# Patient Record
Sex: Male | Born: 1970 | Race: White | Hispanic: No | Marital: Single | Smoking: Former smoker
Health system: Southern US, Community
[De-identification: ages and names within clinical notes are randomized; demographics above are authoritative.]

## PROBLEM LIST (undated history)

## (undated) DIAGNOSIS — K219 Gastro-esophageal reflux disease without esophagitis: Secondary | ICD-10-CM

## (undated) DIAGNOSIS — E079 Disorder of thyroid, unspecified: Secondary | ICD-10-CM

## (undated) DIAGNOSIS — T7840XA Allergy, unspecified, initial encounter: Secondary | ICD-10-CM

## (undated) DIAGNOSIS — Z9889 Other specified postprocedural states: Secondary | ICD-10-CM

## (undated) DIAGNOSIS — G4730 Sleep apnea, unspecified: Secondary | ICD-10-CM

## (undated) DIAGNOSIS — R112 Nausea with vomiting, unspecified: Secondary | ICD-10-CM

## (undated) DIAGNOSIS — I10 Essential (primary) hypertension: Secondary | ICD-10-CM

## (undated) DIAGNOSIS — E119 Type 2 diabetes mellitus without complications: Secondary | ICD-10-CM

## (undated) DIAGNOSIS — R011 Cardiac murmur, unspecified: Secondary | ICD-10-CM

## (undated) DIAGNOSIS — Z720 Tobacco use: Secondary | ICD-10-CM

## (undated) HISTORY — DX: Type 2 diabetes mellitus without complications: E11.9

## (undated) HISTORY — PX: FEMUR FRACTURE SURGERY: SHX633

## (undated) HISTORY — DX: Cardiac murmur, unspecified: R01.1

## (undated) HISTORY — DX: Allergy, unspecified, initial encounter: T78.40XA

## (undated) HISTORY — DX: Tobacco use: Z72.0

## (undated) HISTORY — DX: Disorder of thyroid, unspecified: E07.9

## (undated) HISTORY — DX: Sleep apnea, unspecified: G47.30

## (undated) HISTORY — DX: Gastro-esophageal reflux disease without esophagitis: K21.9

---

## 2007-12-21 ENCOUNTER — Ambulatory Visit: Payer: Self-pay | Admitting: Family Medicine

## 2016-01-02 ENCOUNTER — Telehealth: Payer: Self-pay

## 2016-01-02 NOTE — Telephone Encounter (Signed)
Pt called yesterday and left message regarding scheduling an appt for a sinus infection.

## 2016-01-02 NOTE — Telephone Encounter (Signed)
Called patient back and left message advising that we could not schedule appt until he completes eligibility requirements.

## 2016-02-27 ENCOUNTER — Telehealth: Payer: Self-pay | Admitting: Nurse Practitioner

## 2016-02-27 NOTE — Telephone Encounter (Signed)
Would like to become a patient at [facility], has a chronic sinus infection

## 2016-03-07 ENCOUNTER — Telehealth: Payer: Self-pay | Admitting: Nurse Practitioner

## 2016-04-16 ENCOUNTER — Ambulatory Visit: Payer: Self-pay | Admitting: Family Medicine

## 2016-04-16 VITALS — BP 156/110 | HR 124 | Ht 72.0 in | Wt 213.0 lb

## 2016-04-16 DIAGNOSIS — J0111 Acute recurrent frontal sinusitis: Secondary | ICD-10-CM

## 2016-04-16 DIAGNOSIS — E119 Type 2 diabetes mellitus without complications: Secondary | ICD-10-CM

## 2016-04-16 DIAGNOSIS — I10 Essential (primary) hypertension: Secondary | ICD-10-CM

## 2016-04-16 LAB — POCT CBG (FASTING - GLUCOSE)-MANUAL ENTRY: GLUCOSE FASTING, POC: 498 mg/dL — AB (ref 70–99)

## 2016-04-16 MED ORDER — LISINOPRIL 20 MG PO TABS: 20.0000 mg | ORAL_TABLET | Freq: Every day | ORAL | Status: DC

## 2016-04-16 MED ORDER — METFORMIN HCL 1000 MG PO TABS: 1000.0000 mg | ORAL_TABLET | Freq: Two times a day (BID) | ORAL | Status: DC

## 2016-04-16 MED ORDER — AMOXICILLIN-POT CLAVULANATE 875-125 MG PO TABS: 1.0000 | ORAL_TABLET | Freq: Two times a day (BID) | ORAL | Status: DC

## 2016-04-16 NOTE — Progress Notes (Signed)
   Subjective:    Patient ID: Richard Leber., male    DOB: 1971/10/08, 45 y.o.   MRN: [id number]  HPI  Patient presents with uncontrolled diabetes.  He complains of a sinus infection.  Patient's blood pressure is elevated. Patient complains of legs feeling shaking.  He denies using drugs, alcohol or tobacco.  He has lost 35 pounds without trying to lose weight. Patient urinates frequently.  Review of Systems Patients ears are clear.      Objective:   Physical Exam  HENT:  Head: Normocephalic and atraumatic.  Right Ear: External ear normal.  Left Ear: External ear normal.  Nose: Nose normal.  Mouth/Throat: Oropharynx is clear and moist.  Eyes: Conjunctivae are normal.  Neck: Neck supple.  Cardiovascular: Normal rate, regular rhythm and normal heart sounds.   Pulmonary/Chest: Effort normal and breath sounds normal.  Abdominal: Soft. Bowel sounds are normal.  Skin: Skin is warm and dry.  Psychiatric: He has a normal mood and affect. His behavior is normal. Judgment and thought content normal.          Assessment & Plan:  Type 2 diabetes mellitus Chronic sinusitis Hypertension Patient advised to drink plenty of water. Augmentin 875 BID for 2 weeksChronic sinusitis Metformin 1000mg  BID  Lisinopril 20mg  daily for hypertension RTC1-2 weeks. LABS today:  CBC, TSH, A1c, MetC and Lipids\ Weight loss most likely due to out-of-control diabetes. We'll evaluate if this continues.

## 2016-04-17 LAB — CBC WITH DIFFERENTIAL/PLATELET
BASOS: 0 %
Basophils Absolute: 0 10*3/uL (ref 0.0–0.2)
EOS (ABSOLUTE): 0.2 10*3/uL (ref 0.0–0.4)
EOS: 2 %
HEMATOCRIT: 52 % — AB (ref 37.5–51.0)
Hemoglobin: 17.4 g/dL (ref 12.6–17.7)
Immature Grans (Abs): 0 10*3/uL (ref 0.0–0.1)
Immature Granulocytes: 0 %
LYMPHS ABS: 2.7 10*3/uL (ref 0.7–3.1)
Lymphs: 29 %
MCH: 27.5 pg (ref 26.6–33.0)
MCHC: 33.5 g/dL (ref 31.5–35.7)
MCV: 82 fL (ref 79–97)
MONOS ABS: 0.7 10*3/uL (ref 0.1–0.9)
Monocytes: 8 %
NEUTROS ABS: 5.5 10*3/uL (ref 1.4–7.0)
Neutrophils: 61 %
Platelets: 157 10*3/uL (ref 150–379)
RBC: 6.33 x10E6/uL — ABNORMAL HIGH (ref 4.14–5.80)
RDW: 12.6 % (ref 12.3–15.4)
WBC: 9.2 10*3/uL (ref 3.4–10.8)

## 2016-04-17 LAB — HEMOGLOBIN A1C
Est. average glucose Bld gHb Est-mCnc: 278 mg/dL
HEMOGLOBIN A1C: 11.3 % — AB (ref 4.8–5.6)

## 2016-04-17 LAB — COMPREHENSIVE METABOLIC PANEL
ALBUMIN: 4.4 g/dL (ref 3.5–5.5)
ALK PHOS: 175 IU/L — AB (ref 39–117)
ALT: 50 IU/L — ABNORMAL HIGH (ref 0–44)
AST: 24 IU/L (ref 0–40)
Albumin/Globulin Ratio: 1.4 (ref 1.2–2.2)
BUN / CREAT RATIO: 29 — AB (ref 9–20)
BUN: 20 mg/dL (ref 6–24)
Bilirubin Total: 1.5 mg/dL — ABNORMAL HIGH (ref 0.0–1.2)
CO2: 19 mmol/L (ref 18–29)
CREATININE: 0.69 mg/dL — AB (ref 0.76–1.27)
Calcium: 10.3 mg/dL — ABNORMAL HIGH (ref 8.7–10.2)
Chloride: 93 mmol/L — ABNORMAL LOW (ref 96–106)
GFR calc non Af Amer: 115 mL/min/{1.73_m2} (ref >59)
GFR, EST AFRICAN AMERICAN: 133 mL/min/{1.73_m2} (ref >59)
GLOBULIN, TOTAL: 3.2 g/dL (ref 1.5–4.5)
GLUCOSE: 411 mg/dL — AB (ref 65–99)
Potassium: 4.7 mmol/L (ref 3.5–5.2)
SODIUM: 135 mmol/L (ref 134–144)
TOTAL PROTEIN: 7.6 g/dL (ref 6.0–8.5)

## 2016-04-17 LAB — CHOLESTEROL, TOTAL: CHOLESTEROL TOTAL: 150 mg/dL (ref 100–199)

## 2016-04-17 LAB — CK: Total CK: 37 U/L (ref 24–204)

## 2016-04-17 LAB — TSH: TSH: <0.006 u[IU]/mL — ABNORMAL LOW (ref 0.450–4.500)

## 2016-04-30 ENCOUNTER — Ambulatory Visit: Payer: Self-pay | Admitting: Nurse Practitioner

## 2016-04-30 VITALS — BP 162/90 | HR 89 | Wt 215.0 lb

## 2016-04-30 DIAGNOSIS — R7309 Other abnormal glucose: Secondary | ICD-10-CM

## 2016-04-30 DIAGNOSIS — E0590 Thyrotoxicosis, unspecified without thyrotoxic crisis or storm: Secondary | ICD-10-CM

## 2016-04-30 DIAGNOSIS — I10 Essential (primary) hypertension: Secondary | ICD-10-CM

## 2016-04-30 LAB — GLUCOSE, POCT (MANUAL RESULT ENTRY): POC Glucose: 260 mg/dl — AB (ref 70–99)

## 2016-04-30 MED ORDER — TRIAMTERENE-HCTZ 37.5-25 MG PO TABS: 1.0000 | ORAL_TABLET | Freq: Every day | ORAL | Status: DC

## 2016-04-30 MED ORDER — SITAGLIPTIN PHOSPHATE 100 MG PO TABS: 100.0000 mg | ORAL_TABLET | Freq: Every day | ORAL | Status: DC

## 2016-04-30 MED ORDER — SITAGLIPTIN PHOSPHATE 50 MG PO TABS: 50.0000 mg | ORAL_TABLET | Freq: Every day | ORAL | Status: DC

## 2016-04-30 NOTE — Progress Notes (Signed)
HERE TODAY FOR FOLLOW UP,A1C AT 11.3 S WHICH IS AN AVERAGEW DAILY BLODD SUGAR  OF 297  IS ONLY TAKING METFORMIN 1000 MG BID, IS NOT TAKING ANY OTHER DIABETIC MEDS.  SYSTOLIC BLOOD PRESSURES IN THE 160'S WHICH HAS BEEN AN ISSUE FOR THIS PATIENT.       ON EXAM ALERT VERBALLY APPROPRIATE, IN NO ACUTE DISTRESS.  NO THYROMEGALY NO ADENOPATHY NO CAROID BRUITS BBrS CLEAR, MILDLY DIMINISHED. BLE, WITH EASILY PALPATED PT PULSES.     WILL ADD MAXZIDE, AT 25/37.5, ONE PER DAY. WILL CONTINUE OTHER MED  DIABETES UNDER VERY POOR CONTORL, WILL ADD JANUVIA 100MG  QD  WILL SEE PT BACK IN APPROX 10 DAYS TO ASSESS RESPONSE TO NEW MEDS.    WILL DRAW FULL THYROID PANEL TO REASSESS THYROID FUNCTION.    WOULD LIKE TO SEE BACK IN CLINIC IN 2 WEEKS, TO ASSESS RES[OPNSE TO MED

## 2016-05-02 ENCOUNTER — Ambulatory Visit: Payer: Self-pay

## 2016-05-09 ENCOUNTER — Ambulatory Visit: Payer: Self-pay | Admitting: Ophthalmology

## 2016-05-15 ENCOUNTER — Ambulatory Visit: Payer: Self-pay | Admitting: Internal Medicine

## 2016-05-16 ENCOUNTER — Ambulatory Visit: Payer: Self-pay | Admitting: Ophthalmology

## 2016-05-29 ENCOUNTER — Encounter: Payer: Self-pay | Admitting: Internal Medicine

## 2016-05-29 ENCOUNTER — Ambulatory Visit: Payer: Self-pay | Admitting: Internal Medicine

## 2016-05-29 VITALS — BP 149/94 | HR 109 | Temp 98.0°F | Wt 225.0 lb

## 2016-05-29 DIAGNOSIS — E119 Type 2 diabetes mellitus without complications: Secondary | ICD-10-CM

## 2016-05-29 NOTE — Progress Notes (Signed)
   Subjective:    Patient ID: Richard Riley., male    DOB: 1971/01/16, 45 y.o.   MRN: [id number]  HPI  Patient is presenting with hypertension.  He stopped taking the Maxzide due to side effects.  Follow up to diabetes.  Review of Systems Blood pressure was taken again 120/80    Objective:   Physical Exam  Constitutional: He is oriented to person, place, and time.  Cardiovascular: Normal rate, regular rhythm and normal heart sounds.   Pulmonary/Chest: Effort normal and breath sounds normal.  Neurological: He is alert and oriented to person, place, and time.      Medication List       Accurate as of 05/29/16 11:31 AM. Always use your most recent med list.          amoxicillin-clavulanate 875-125 MG tablet Commonly known as:  AUGMENTIN Take 1 tablet by mouth 2 (two) times daily.   lisinopril 20 MG tablet Commonly known as:  PRINIVIL,ZESTRIL Take 1 tablet (20 mg total) by mouth daily.   metFORMIN 1000 MG tablet Commonly known as:  GLUCOPHAGE Take 1 tablet (1,000 mg total) by mouth 2 (two) times daily with a meal.   sitaGLIPtin 100 MG tablet Commonly known as:  JANUVIA Take 1 tablet (100 mg total) by mouth daily.   triamterene-hydrochlorothiazide 37.5-25 MG tablet Commonly known as:  MAXZIDE-25 Take 1 tablet by mouth daily.      BP (!) 149/94   Pulse (!) 109   Temp 98 F (36.7 C)   Wt 225 lb (102.1 kg)   BMI 30.52 kg/m        Assessment & Plan:  Patient to continue lisinopril with no additional medications at this time.  Patient to follow up for A1c after he has been on diabetes medications for 3 months.  He is follow up in 2 months with labs prior A1c and MetC

## 2016-05-29 NOTE — Patient Instructions (Signed)
Follow up labs MetC and A1c

## 2016-07-24 ENCOUNTER — Other Ambulatory Visit: Payer: Self-pay

## 2016-07-24 DIAGNOSIS — E119 Type 2 diabetes mellitus without complications: Secondary | ICD-10-CM

## 2016-07-25 LAB — COMPREHENSIVE METABOLIC PANEL
A/G RATIO: 1.3 (ref 1.2–2.2)
ALBUMIN: 4 g/dL (ref 3.5–5.5)
ALK PHOS: 143 IU/L — AB (ref 39–117)
ALT: 58 IU/L — ABNORMAL HIGH (ref 0–44)
AST: 22 IU/L (ref 0–40)
BUN / CREAT RATIO: 30 — AB (ref 9–20)
BUN: 20 mg/dL (ref 6–24)
Bilirubin Total: 1.1 mg/dL (ref 0.0–1.2)
CO2: 24 mmol/L (ref 18–29)
Calcium: 9.8 mg/dL (ref 8.7–10.2)
Chloride: 98 mmol/L (ref 96–106)
Creatinine, Ser: 0.66 mg/dL — ABNORMAL LOW (ref 0.76–1.27)
GFR calc Af Amer: 136 mL/min/{1.73_m2} (ref >59)
GFR calc non Af Amer: 118 mL/min/{1.73_m2} (ref >59)
GLOBULIN, TOTAL: 3 g/dL (ref 1.5–4.5)
Glucose: 340 mg/dL — ABNORMAL HIGH (ref 65–99)
POTASSIUM: 4.2 mmol/L (ref 3.5–5.2)
SODIUM: 137 mmol/L (ref 134–144)
Total Protein: 7 g/dL (ref 6.0–8.5)

## 2016-07-25 LAB — HEMOGLOBIN A1C
ESTIMATED AVERAGE GLUCOSE: 235 mg/dL
HEMOGLOBIN A1C: 9.8 % — AB (ref 4.8–5.6)

## 2016-07-30 ENCOUNTER — Telehealth: Payer: Self-pay

## 2016-07-30 NOTE — Telephone Encounter (Signed)
Pt called and confirmed appt for 07/31/2016.

## 2016-07-31 ENCOUNTER — Ambulatory Visit: Payer: Self-pay | Admitting: Internal Medicine

## 2016-07-31 ENCOUNTER — Encounter: Payer: Self-pay | Admitting: Internal Medicine

## 2016-07-31 VITALS — BP 150/96 | HR 120 | Temp 98.2°F | Wt 228.0 lb

## 2016-07-31 DIAGNOSIS — E119 Type 2 diabetes mellitus without complications: Secondary | ICD-10-CM | POA: Insufficient documentation

## 2016-07-31 DIAGNOSIS — E0590 Thyrotoxicosis, unspecified without thyrotoxic crisis or storm: Secondary | ICD-10-CM

## 2016-07-31 LAB — GLUCOSE, POCT (MANUAL RESULT ENTRY): POC Glucose: 301 mg/dl — AB (ref 70–99)

## 2016-07-31 MED ORDER — METOPROLOL TARTRATE 50 MG PO TABS: 50.0000 mg | ORAL_TABLET | Freq: Two times a day (BID) | ORAL | 2 refills | Status: DC

## 2016-07-31 NOTE — Progress Notes (Signed)
If noting feeling a lot better with new Rx Lopressor. Go to ER for urgent evaluation of Thyroid,etc.

## 2016-07-31 NOTE — Patient Instructions (Addendum)
Referral to [facility] for Thyroid scan and uptake for overactive thyroid and EKG for rapid heart rate Labs today: TSH, T4 level F/u in 2 weeks

## 2016-07-31 NOTE — Progress Notes (Signed)
   Subjective:    Patient ID: Richard Riley., male    DOB: [birth year], 45 y.o.   MRN: [id number]  HPI   Pt f/u for diabetes and hypertension. A1C remains elevated at 9.8. BP is elevated at 150/96 Stopped diuretics last visit b/c of question of side effects.  Pt reports pressure in head, dizziness, and shaking in hands. Reports heart rate increases. Reports no pain. Reports feeling began when doing physical activity but now it is more regular. Reports trouble sleeping.   Reports consumption of alcohol occasionally but not since this regular dizziness.     There are no active problems to display for this patient.    Medication List       Accurate as of 07/31/16 10:23 AM. Always use your most recent med list.          amoxicillin-clavulanate 875-125 MG tablet Commonly known as:  AUGMENTIN Take 1 tablet by mouth 2 (two) times daily.   lisinopril 20 MG tablet Commonly known as:  PRINIVIL,ZESTRIL Take 1 tablet (20 mg total) by mouth daily.   metFORMIN 1000 MG tablet Commonly known as:  GLUCOPHAGE Take 1 tablet (1,000 mg total) by mouth 2 (two) times daily with a meal.   sitaGLIPtin 100 MG tablet Commonly known as:  JANUVIA Take 1 tablet (100 mg total) by mouth daily.   triamterene-hydrochlorothiazide 37.5-25 MG tablet Commonly known as:  MAXZIDE-25 Take 1 tablet by mouth daily.         Review of Systems     Objective:   Physical Exam  Constitutional: He is oriented to person, place, and time.  Cardiovascular: Normal rate, regular rhythm and normal heart sounds.   Pulmonary/Chest: Effort normal and breath sounds normal.  Neurological: He is alert and oriented to person, place, and time.    BP (!) 150/96   Pulse (!) 120   Temp 98.2 F (36.8 C)   Wt 228 lb (103.4 kg)   BMI 30.92 kg/m         Assessment & Plan:   Dr. Mable Fill thinks dizziness due to overactive thyroid.  Referral to [facility] for Thyroid scan and uptake for overactive thyroid and EKG for rapid  heart rate Labs today: TSH, T4 level F/u in 2 weeks

## 2016-08-01 ENCOUNTER — Telehealth: Payer: Self-pay

## 2016-08-01 ENCOUNTER — Other Ambulatory Visit: Payer: Self-pay | Admitting: Specialist

## 2016-08-01 DIAGNOSIS — E0590 Thyrotoxicosis, unspecified without thyrotoxic crisis or storm: Secondary | ICD-10-CM

## 2016-08-01 LAB — T4: T4, Total: 15.6 ug/dL — ABNORMAL HIGH (ref 4.5–12.0)

## 2016-08-01 LAB — TSH: TSH: <0.006 u[IU]/mL — ABNORMAL LOW (ref 0.450–4.500)

## 2016-08-01 NOTE — Telephone Encounter (Signed)
Pt called in asking about the EKG order that was placed. I explained to him the thyroid order was stat so that is why it was handled so quick but he should be receiving a phone call within the next two weeks to schedule the EKG. PT verbalized understanding.

## 2016-08-05 ENCOUNTER — Encounter
Admission: RE | Admit: 2016-08-05 | Discharge: 2016-08-05 | Disposition: A | Discharge status: Home / Self Care | Payer: Self-pay | Source: Ambulatory Visit | Attending: Specialist | Admitting: Specialist

## 2016-08-05 DIAGNOSIS — E0590 Thyrotoxicosis, unspecified without thyrotoxic crisis or storm: Secondary | ICD-10-CM

## 2016-08-05 DIAGNOSIS — E0520 Thyrotoxicosis with toxic multinodular goiter without thyrotoxic crisis or storm: Secondary | ICD-10-CM | POA: Insufficient documentation

## 2016-08-05 MED ORDER — SODIUM IODIDE I-123 7.4 MBQ CAPS
140.7000 | ORAL_CAPSULE | Freq: Once | ORAL | Status: AC
  Administered 2016-08-05: 140.7 via ORAL

## 2016-08-06 ENCOUNTER — Encounter
Admission: RE | Admit: 2016-08-06 | Discharge: 2016-08-06 | Disposition: A | Discharge status: Home / Self Care | Payer: Self-pay | Source: Ambulatory Visit | Attending: Specialist | Admitting: Specialist

## 2016-08-07 ENCOUNTER — Ambulatory Visit: Payer: Self-pay | Admitting: Internal Medicine

## 2016-08-07 ENCOUNTER — Encounter: Payer: Self-pay | Admitting: Internal Medicine

## 2016-08-07 VITALS — BP 135/79 | HR 95 | Temp 98.4°F | Wt 225.0 lb

## 2016-08-07 DIAGNOSIS — E119 Type 2 diabetes mellitus without complications: Secondary | ICD-10-CM

## 2016-08-07 DIAGNOSIS — E0590 Thyrotoxicosis, unspecified without thyrotoxic crisis or storm: Secondary | ICD-10-CM

## 2016-08-07 LAB — GLUCOSE, POCT (MANUAL RESULT ENTRY): POC Glucose: 308 mg/dl — AB (ref 70–99)

## 2016-08-07 NOTE — Progress Notes (Signed)
   Subjective:    Patient ID: Richard Leber., male    DOB: Apr 07, 1971, 45 y.o.   MRN: [id number]  HPI   Pt f/u for thyroid multiple uptake results. Results diagnose hyperthyroidism.  His pulse has decreased from last visit from 120 to 95. Has been taking metoprolol since last visit.   Reports occasional trouble swallowing.   Patient Active Problem List   Diagnosis Date Noted  . Diabetes ([place]) 07/31/2016     Medication List       Accurate as of 08/07/16 10:15 AM. Always use your most recent med list.          amoxicillin-clavulanate 875-125 MG tablet Commonly known as:  AUGMENTIN Take 1 tablet by mouth 2 (two) times daily.   lisinopril 20 MG tablet Commonly known as:  PRINIVIL,ZESTRIL Take 1 tablet (20 mg total) by mouth daily.   metFORMIN 1000 MG tablet Commonly known as:  GLUCOPHAGE Take 1 tablet (1,000 mg total) by mouth 2 (two) times daily with a meal.   metoprolol 50 MG tablet Commonly known as:  LOPRESSOR Take 1 tablet (50 mg total) by mouth 2 (two) times daily.   sitaGLIPtin 100 MG tablet Commonly known as:  JANUVIA Take 1 tablet (100 mg total) by mouth daily.   triamterene-hydrochlorothiazide 37.5-25 MG tablet Commonly known as:  MAXZIDE-25 Take 1 tablet by mouth daily.         Review of Systems     Objective:   Physical Exam  Constitutional: He is oriented to person, place, and time.  Cardiovascular: Normal rate and regular rhythm.   Pulmonary/Chest: Effort normal and breath sounds normal.  Neurological: He is alert and oriented to person, place, and time.    BP 135/79   Pulse 95   Temp 98.4 F (36.9 C) (Oral)   Wt 225 lb (102.1 kg)   BMI 30.52 kg/m        Assessment & Plan:  Thyroid and heart rate not as overactive today Pt advised on treatment options for hyperthyroid, surgical, medication, and radioisotope.   Pt advised if further deterioration to go to ED   STAT Referral for Hyperthyroid i131 Ablasion therapy Referral for  thyroid ultrasound  Keep apt in 2 weeks

## 2016-08-07 NOTE — Patient Instructions (Signed)
STAT referral for hyperthyroid i131 ablasion therapy Referral for thyroid ultrasound

## 2016-08-14 ENCOUNTER — Ambulatory Visit
Admission: RE | Admit: 2016-08-14 | Discharge: 2016-08-14 | Disposition: A | Discharge status: Home / Self Care | Payer: Self-pay | Source: Ambulatory Visit | Attending: Internal Medicine | Admitting: Internal Medicine

## 2016-08-14 DIAGNOSIS — E0590 Thyrotoxicosis, unspecified without thyrotoxic crisis or storm: Secondary | ICD-10-CM | POA: Insufficient documentation

## 2016-08-14 DIAGNOSIS — E041 Nontoxic single thyroid nodule: Secondary | ICD-10-CM | POA: Insufficient documentation

## 2016-08-21 ENCOUNTER — Encounter: Payer: Self-pay | Admitting: Internal Medicine

## 2016-08-21 ENCOUNTER — Ambulatory Visit: Payer: Self-pay | Admitting: Internal Medicine

## 2016-08-21 VITALS — BP 133/87 | HR 98 | Temp 97.7°F | Wt 224.0 lb

## 2016-08-21 DIAGNOSIS — E119 Type 2 diabetes mellitus without complications: Secondary | ICD-10-CM

## 2016-08-21 DIAGNOSIS — E041 Nontoxic single thyroid nodule: Secondary | ICD-10-CM

## 2016-08-21 LAB — GLUCOSE, POCT (MANUAL RESULT ENTRY): POC GLUCOSE: 402 mg/dL — AB (ref 70–99)

## 2016-08-21 NOTE — Progress Notes (Signed)
   Subjective:    Patient ID: Richard Riley., male    DOB: 09-10-1971, 45 y.o.   MRN: [id number]  HPI   Pt f/u for diabetes, review labs and ultrasound of thyroid, and hyperthyroidism. Ultrasound show small cyst.    Patient Active Problem List   Diagnosis Date Noted  . Diabetes ([place]) 07/31/2016     Medication List       Accurate as of 08/21/16 10:56 AM. Always use your most recent med list.          amoxicillin-clavulanate 875-125 MG tablet Commonly known as:  AUGMENTIN Take 1 tablet by mouth 2 (two) times daily.   lisinopril 20 MG tablet Commonly known as:  PRINIVIL,ZESTRIL Take 1 tablet (20 mg total) by mouth daily.   metFORMIN 1000 MG tablet Commonly known as:  GLUCOPHAGE Take 1 tablet (1,000 mg total) by mouth 2 (two) times daily with a meal.   metoprolol 50 MG tablet Commonly known as:  LOPRESSOR Take 1 tablet (50 mg total) by mouth 2 (two) times daily.   sitaGLIPtin 100 MG tablet Commonly known as:  JANUVIA Take 1 tablet (100 mg total) by mouth daily.   triamterene-hydrochlorothiazide 37.5-25 MG tablet Commonly known as:  MAXZIDE-25 Take 1 tablet by mouth daily.        Review of Systems     Objective:   Physical Exam  Constitutional: He is oriented to person, place, and time.  Cardiovascular: Normal rate, regular rhythm and normal heart sounds.   Pulmonary/Chest: Effort normal and breath sounds normal.  Neurological: He is alert and oriented to person, place, and time.    BP 133/87   Pulse 98   Temp 97.7 F (36.5 C) (Oral)   Wt 224 lb (101.6 kg)   BMI 30.38 kg/m        Assessment & Plan:   F/u in 3 weeks. No labs.  Referral for biopsy of single nodule on thyroid.

## 2016-08-21 NOTE — Patient Instructions (Signed)
F/u in 3 weeks. No Labs.  Referral for biopsy of nodule on thyroid

## 2016-08-27 ENCOUNTER — Telehealth: Payer: Self-pay | Admitting: Nurse Practitioner

## 2016-08-27 NOTE — Telephone Encounter (Signed)
Patient saw Dr. Mable Fill on 9/27 about EKG, rapid heart rate and hasn't received a call about his prescription

## 2016-08-28 ENCOUNTER — Telehealth: Payer: Self-pay

## 2016-08-28 ENCOUNTER — Other Ambulatory Visit: Payer: Self-pay

## 2016-08-28 DIAGNOSIS — E0590 Thyrotoxicosis, unspecified without thyrotoxic crisis or storm: Secondary | ICD-10-CM

## 2016-08-28 MED ORDER — METOPROLOL TARTRATE 50 MG PO TABS: 50.0000 mg | ORAL_TABLET | Freq: Two times a day (BID) | ORAL | 2 refills | Status: DC

## 2016-08-28 NOTE — Telephone Encounter (Signed)
Called pt to let him know I got pt's scheduled for the therapy for the hyperthyroid on Nov 2 at 1:00. Also got the pt the number to get the EKG and biopsy scheduled. Pt verbalized understanding. Pt will get those scheduled If he has any problems he will call me back.

## 2016-08-30 ENCOUNTER — Ambulatory Visit
Admission: RE | Admit: 2016-08-30 | Discharge: 2016-08-30 | Disposition: A | Discharge status: Home / Self Care | Payer: PRIVATE HEALTH INSURANCE | Source: Ambulatory Visit | Attending: Internal Medicine | Admitting: Internal Medicine

## 2016-08-30 DIAGNOSIS — E0590 Thyrotoxicosis, unspecified without thyrotoxic crisis or storm: Secondary | ICD-10-CM | POA: Insufficient documentation

## 2016-09-05 ENCOUNTER — Ambulatory Visit: Payer: PRIVATE HEALTH INSURANCE

## 2016-09-05 ENCOUNTER — Telehealth: Payer: Self-pay

## 2016-09-05 NOTE — Telephone Encounter (Signed)
Called pt to give appt for thyroid biopsy that is set for Monday Nov. 6 at 12:30. [facility] and they will tell him where to go from there. Left message with mother to have him call me back.

## 2016-09-09 ENCOUNTER — Ambulatory Visit
Admission: RE | Admit: 2016-09-09 | Discharge: 2016-09-09 | Disposition: A | Discharge status: Home / Self Care | Payer: PRIVATE HEALTH INSURANCE | Source: Ambulatory Visit | Attending: Internal Medicine | Admitting: Internal Medicine

## 2016-09-09 DIAGNOSIS — E041 Nontoxic single thyroid nodule: Secondary | ICD-10-CM

## 2016-09-09 DIAGNOSIS — D34 Benign neoplasm of thyroid gland: Secondary | ICD-10-CM | POA: Insufficient documentation

## 2016-09-09 HISTORY — DX: Essential (primary) hypertension: I10

## 2016-09-09 NOTE — Procedures (Signed)
FNA 2.7 cm L thyroid nodule No comp/EBL

## 2016-09-10 ENCOUNTER — Telehealth: Payer: Self-pay | Admitting: Urology

## 2016-09-10 NOTE — Telephone Encounter (Signed)
Pt returning Mandy's call.

## 2016-09-11 LAB — CYTOLOGY - NON PAP

## 2016-09-12 ENCOUNTER — Telehealth: Payer: Self-pay

## 2016-09-12 NOTE — Telephone Encounter (Signed)
[facility] called in with the results from the thyroid biopsy. The thyroid is suspicious for malignancy. He needs to see an ENT and maybe have a possible lobectomy. A lady named Rubinas from [phone] called with the results.

## 2016-09-18 ENCOUNTER — Telehealth: Payer: Self-pay

## 2016-09-18 ENCOUNTER — Encounter: Payer: Self-pay | Admitting: Internal Medicine

## 2016-09-18 ENCOUNTER — Ambulatory Visit: Payer: Self-pay | Admitting: Internal Medicine

## 2016-09-18 VITALS — BP 179/94 | HR 90 | Temp 97.7°F | Wt 229.0 lb

## 2016-09-18 DIAGNOSIS — E119 Type 2 diabetes mellitus without complications: Secondary | ICD-10-CM

## 2016-09-18 DIAGNOSIS — E041 Nontoxic single thyroid nodule: Secondary | ICD-10-CM

## 2016-09-18 LAB — GLUCOSE, POCT (MANUAL RESULT ENTRY): POC GLUCOSE: 195 mg/dL — AB (ref 70–99)

## 2016-09-18 NOTE — Patient Instructions (Addendum)
Therapy for overactive thyroid scheduled on 11/30 at [facility]. This may need to be rescheduled after further evaluation of thyroid nodule. Biopsy suggests possible malignancy.   Referral to surgery for thyroid nodule evaluation.   F/u in 4 weeks.

## 2016-09-18 NOTE — Telephone Encounter (Signed)
Amber from [facility] called me back after I called this morning to get pt in for a possible lobectomy due to the results from thyroid biopsy that showed possible malignancy on thyroid. She explained to me that the thyroid cancer if pt has it grows very slow. In order for them to see pt and do the lobectomy, his thyroid and glucose (a1C) labs need to be under control. They are to high for them to operate on pt at this time. She recommend me send him to an endocrinologist. We scheduled him for our endo night tues the 21st at 6:00. Once his numbers are under control they can schedule to see him and get the surgery done. She also wanted me to cancel the radioactive therapy scheduled for pt on  Nov 30th due to they will also not operate if pt has radioactive exposure. Called to do that but no answer will try again tmw. So the plan is to get these numbers down and then he will see the surgeon.

## 2016-09-18 NOTE — Progress Notes (Signed)
   Subjective:    Patient ID: Richard Riley., male    DOB: 18-Oct-1971, 45 y.o.   MRN: [id number]  HPI   Pt f/u for lab and EKG results. EKG was normal.  Pt chief complaints: hypothyroidism and diabetes.  Pt had biopsy of L thyroid nodule. Results suspicious of localized malignant cells and needs surgical removal.    Patient Active Problem List   Diagnosis Date Noted  . Diabetes ([place]) 07/31/2016   Current Outpatient Prescriptions on File Prior to Visit  Medication Sig Dispense Refill  . lisinopril (PRINIVIL,ZESTRIL) 20 MG tablet Take 1 tablet (20 mg total) by mouth daily. 30 tablet 6  . metFORMIN (GLUCOPHAGE) 1000 MG tablet Take 1 tablet (1,000 mg total) by mouth 2 (two) times daily with a meal. 60 tablet 6  . metoprolol (LOPRESSOR) 50 MG tablet Take 1 tablet (50 mg total) by mouth 2 (two) times daily. 60 tablet 2  . sitaGLIPtin (JANUVIA) 100 MG tablet Take 1 tablet (100 mg total) by mouth daily. 90 tablet 1  . amoxicillin-clavulanate (AUGMENTIN) 875-125 MG tablet Take 1 tablet by mouth 2 (two) times daily. (Patient not taking: Reported on 09/18/2016) 28 tablet 0  . triamterene-hydrochlorothiazide (MAXZIDE-25) 37.5-25 MG tablet Take 1 tablet by mouth daily. (Patient not taking: Reported on 09/18/2016) 90 tablet 1   No current facility-administered medications on file prior to visit.      Review of Systems  Reviewed biopsy report and answered questions.     Objective:   Physical Exam  BP (!) 179/94   Pulse 90   Temp 97.7 F (36.5 C) (Oral)   Wt 229 lb (103.9 kg)   BMI 31.06 kg/m   Glucose reading elevated at 195.     Assessment & Plan:  Therapy for overactive thyroid scheduled on 11/30 at [facility]. This may need to be rescheduled after further evaluation of thyroid nodule. Biopsy suggests possible malignancy.   Referral to surgery for thyroid nodule evaluation.   F/u in 4 weeks w/ labs: A1C, Met C

## 2016-09-24 ENCOUNTER — Ambulatory Visit: Payer: Self-pay | Admitting: Endocrinology

## 2016-09-24 VITALS — BP 148/102 | HR 99 | Temp 97.7°F | Wt 228.0 lb

## 2016-09-24 DIAGNOSIS — E0590 Thyrotoxicosis, unspecified without thyrotoxic crisis or storm: Secondary | ICD-10-CM

## 2016-09-24 DIAGNOSIS — E119 Type 2 diabetes mellitus without complications: Secondary | ICD-10-CM

## 2016-09-24 LAB — POCT GLYCOSYLATED HEMOGLOBIN (HGB A1C): Hemoglobin A1C: 9

## 2016-09-24 LAB — GLUCOSE, POCT (MANUAL RESULT ENTRY): POC Glucose: 107 mg/dl — AB (ref 70–99)

## 2016-09-24 MED ORDER — METHIMAZOLE 10 MG PO TABS: 10.0000 mg | ORAL_TABLET | Freq: Every day | ORAL | 3 refills | Status: DC

## 2016-09-24 MED ORDER — PEN NEEDLES 32G X 5 MM MISC: 1.0000 | Freq: Every day | 5 refills | Status: DC

## 2016-09-24 MED ORDER — INSULIN GLARGINE 100 UNIT/ML SOLOSTAR PEN: PEN_INJECTOR | SUBCUTANEOUS | 11 refills | Status: DC

## 2016-09-24 NOTE — Patient Instructions (Addendum)
For your thyroid:   Take 10 mg of the methamazole once a day to help with your symptoms of hyperthyroidism.   For your diabetes:   Keep taking your medications and add Lantus insulin once a day in the evening. Test your blood sugar once a day in the morning.   Begin taking 20 units of Lantus in the evening. After three days, if your morning blood sugar is above 130 mg/DL, add 2 units of Lantus. Wait another three days and if your morning blood sugar is above 130 mg/DL, add another 2 units. Continue to wait three days and add 2 units to your total Lantus dose until your morning blood sugar is under 130.   Write down your morning blood sugar readings and bring them to your next appointment.

## 2016-09-24 NOTE — Progress Notes (Signed)
Assessment:   Richard Riley is a 45 year old male with symptomatic hyperthyroidism, waiting for lobectomy, with HbA1c above target (9%). Because he needs to get his HbA1c down to get his lobectomoy and insulin is a definitive way to lower his blood sugar, we will add lantus to his diabetes medications and instruct him to titrate until his AM blood sugar is below 130. He is agreeable to this plan.    Plan:    1. Type 2 Diabetes: HbA1c above target for surgery (HbA1c 9% today). Will prescribe 20 U of Lantus once a day. Instructed to test blood glucose in the management and bring log book to next appointment.   Titration:  Take 20 U of Lantus at night.  Test blood sugar in the morning.  After three days, if AM blood sugar is above 130 mg/dL then increase Lantus by 2 units.  Wait another three days and repeat until AM blood sugar is below 130.   2. Hyperthyroidism: symptomatic for hyperthyroidism. Has been prescribed metoprolol. Will prescribe methamazole (10 mg) medication, 1x/day, to use in between now and thyroid surgery.    4. Follow up: I recommend patient follow-up with me in 1 months   We will see him back in a month.      Subjective:    Richard Coye Deraad. is a 45 y.o. male who is seen in follow up forType 2 diabetes from Visit date not found  complicated by hyperthyroidism. He was referred to endocrinology to get his Hba1c under control for surgery to remove thyroid malignancy. His HbA1c today is 9% down from 9.8% in September 2017.   Eye exam current (within one year): 2-3 months ago at [facility], normal   Patient does not have a blood sugar meter. We gave him a meter and asked him to test in the AM.  His POC glucose was 109 today.   Richard Orlie Dakin. is performing SMBG on average 0 times per day.  Reports no symptoms of hypoglycemia. He reports being shaky and lightheaded from overactive thryoid.   Denies chest pain, shortness of breath, foot lesions or ulcers. Reports lower  leg edema 2-3 times a month. Reports his heart has been racing recently, 2-3 times a week.   Denies severe hypoglycemia or admission to hospital for DKA.  Current exercise: None.  The patient's history was reviewed and updated as appropriate.  No Known Allergies   Current Outpatient Prescriptions:  .  lisinopril (PRINIVIL,ZESTRIL) 20 MG tablet, Take 1 tablet (20 mg total) by mouth daily., Disp: 30 tablet, Rfl: 6 .  metFORMIN (GLUCOPHAGE) 1000 MG tablet, Take 1 tablet (1,000 mg total) by mouth 2 (two) times daily with a meal., Disp: 60 tablet, Rfl: 6 .  metoprolol (LOPRESSOR) 50 MG tablet, Take 1 tablet (50 mg total) by mouth 2 (two) times daily., Disp: 60 tablet, Rfl: 2 .  sitaGLIPtin (JANUVIA) 100 MG tablet, Take 1 tablet (100 mg total) by mouth daily., Disp: 90 tablet, Rfl: 1 .  amoxicillin-clavulanate (AUGMENTIN) 875-125 MG tablet, Take 1 tablet by mouth 2 (two) times daily. (Patient not taking: Reported on 09/24/2016), Disp: 28 tablet, Rfl: 0 .  triamterene-hydrochlorothiazide (MAXZIDE-25) 37.5-25 MG tablet, Take 1 tablet by mouth daily. (Patient not taking: Reported on 09/24/2016), Disp: 90 tablet, Rfl: 1  Social History   Social History  . Marital status: Single    Spouse name: N/A  . Number of children: N/A  . Years of education: N/A   Social History Main  Topics  . Smoking status: Former Smoker    Quit date: 09/10/2015  . Smokeless tobacco: Current User    Types: Chew, Snuff  . Alcohol use No  . Drug use: No  . Sexual activity: Not on file   Other Topics Concern  . Not on file   Social History Narrative  . No narrative on file    No family history on file.  Review of Systems A 12 point review of systems was negative except for pertinent items noted in the HPI.   Objective:     Wt Readings from Last 3 Encounters:  09/18/16 229 lb (103.9 kg)  08/21/16 224 lb (101.6 kg)  08/07/16 225 lb (102.1 kg)   There were no vitals taken for this visit.  General  appearance:  alert and appears stated age, in no distress      Eyes:  conjunctivae/corneas clear. EOM's intact. Sclera anicteric. Negative lid lag or proptosis     Neck: no adenopathy, supple, symmetrical, trachea midline  Thyroid:  Mobile, normal size, no palpable nodule  Lung: clear to auscultation bilaterally  Heart:  regular rate and rhythm, S1, S2 normal, no murmur, click, rub or gallop  Abdomen:  bowel sounds normal; negative bruits  Extremities: extremities normal, atraumatic, no cyanosis or edema     Pulses: DP & PT 2+ and symmetric.  Neuro: normal without focal findings, mental status, speech normal, alert and oriented x3, reflexes normal and symmetric and gait normal.   Feet: negative lesions or ulcers, 10 gram monofilament intact bilateral plantar surface     Lab Review No components found for: A1C Glucose (mg/dL)  Date Value  07/24/2016 340 (H)  04/16/2016 411 (H)   CO2 (mmol/L)  Date Value  07/24/2016 24  04/16/2016 19   BUN (mg/dL)  Date Value  07/24/2016 20  04/16/2016 20   Creatinine, Ser (mg/dL)  Date Value  07/24/2016 0.66 (L)  04/16/2016 0.69 (L)   No components found for: LDL,  LDLCALC,  LDLDIRECT Lab Results  Component Value Date   NA 137 07/24/2016   K 4.2 07/24/2016   CL 98 07/24/2016   CO2 24 07/24/2016   BUN 20 07/24/2016   CREATININE 0.66 (L) 07/24/2016   GFRAA 136 07/24/2016   GFRNONAA 118 07/24/2016   CALCIUM 9.8 07/24/2016   ALBUMIN 4.0 07/24/2016     DIABETES MELLITUS RESULTS: Lab Results  Component Value Date   HGBA1C 9.8 (H) 07/24/2016   HGBA1C 11.3 (H) 04/16/2016   Lab Results  Component Value Date   CREATININE 0.66 (L) 07/24/2016   Lab Results  Component Value Date   CHOL 150 04/16/2016   No results found for: LDLCALC No components found for: CHOLLLDLDIRECT No components found for: MICROALB/CR Lab Results  Component Value Date   GFRAA 136 07/24/2016   GFRNONAA 118 07/24/2016

## 2016-10-01 ENCOUNTER — Telehealth: Payer: Self-pay | Admitting: General Practice

## 2016-10-01 NOTE — Telephone Encounter (Addendum)
Called med management and hasn't heard back yet. Hoping we could call them.   12/5 pt called to indicate that meds were picked up Friday. Didn't get it from med management Marvetta Gibbons)

## 2016-10-03 ENCOUNTER — Ambulatory Visit: Payer: PRIVATE HEALTH INSURANCE

## 2016-10-04 ENCOUNTER — Telehealth: Payer: Self-pay

## 2016-10-04 NOTE — Telephone Encounter (Signed)
PT called in asking about his Rx. He stated he has not received them yet and [facility] has not called him back. Wondering what is going on. Left message for him to call me back.

## 2016-10-04 NOTE — Telephone Encounter (Signed)
Called [facility] and we discussed that pt picked up RX's today and they he was asking for a januvia refill. They need to know if he received his last RX at his home and if so they need paperwork from him stating he did. Anne Ng is going to see what we need to do at this point in time to get another refill. She will call me back.

## 2016-10-10 ENCOUNTER — Other Ambulatory Visit: Payer: Self-pay

## 2016-10-10 DIAGNOSIS — E119 Type 2 diabetes mellitus without complications: Secondary | ICD-10-CM

## 2016-10-11 ENCOUNTER — Telehealth: Payer: Self-pay

## 2016-10-11 LAB — COMPREHENSIVE METABOLIC PANEL
ALBUMIN: 4.5 g/dL (ref 3.5–5.5)
ALT: 35 IU/L (ref 0–44)
AST: 24 IU/L (ref 0–40)
Albumin/Globulin Ratio: 1.9 (ref 1.2–2.2)
Alkaline Phosphatase: 116 IU/L (ref 39–117)
BUN / CREAT RATIO: 28 — AB (ref 9–20)
BUN: 17 mg/dL (ref 6–24)
Bilirubin Total: 1.1 mg/dL (ref 0.0–1.2)
CALCIUM: 9.5 mg/dL (ref 8.7–10.2)
CO2: 23 mmol/L (ref 18–29)
CREATININE: 0.61 mg/dL — AB (ref 0.76–1.27)
Chloride: 102 mmol/L (ref 96–106)
GFR, EST AFRICAN AMERICAN: 139 mL/min/{1.73_m2} (ref >59)
GFR, EST NON AFRICAN AMERICAN: 121 mL/min/{1.73_m2} (ref >59)
GLOBULIN, TOTAL: 2.4 g/dL (ref 1.5–4.5)
Glucose: 98 mg/dL (ref 65–99)
Potassium: 4 mmol/L (ref 3.5–5.2)
SODIUM: 143 mmol/L (ref 134–144)
Total Protein: 6.9 g/dL (ref 6.0–8.5)

## 2016-10-11 LAB — HEMOGLOBIN A1C
Est. average glucose Bld gHb Est-mCnc: 203 mg/dL
Hgb A1c MFr Bld: 8.7 % — ABNORMAL HIGH (ref 4.8–5.6)

## 2016-10-11 NOTE — Telephone Encounter (Signed)
Called pt to go over message from [facility] about [place]. Pt was not home. Left msg.

## 2016-10-11 NOTE — Telephone Encounter (Signed)
-----   Message from Anastasio Champion sent at 10/07/2016  9:20 AM EST ----- Regarding: Reply on patient's med I received a message on Friday to check on patient's med and give you a call. I was on a Webin[employer] call for 2 hours on Friday pm. I have called [facility] this morning according to representative they processed the application and shipped a 90 day supply 08/01/16 to patient's home & was delivered to patient 08/06/16. [facility] pharmacy upgraded their system and shipped to patient's instead of our office. When a patient receives medication at their home if they do not contact us and let us know we do not know when to reorder, also there is paperwork that patient receives with the shipment that we request that they bring to [place]. I have spoke with a pharmacy representative/Trey today and requested a refill and for them to ship to our office this order will release 10/30/16 to [place].

## 2016-10-14 ENCOUNTER — Other Ambulatory Visit: Payer: Self-pay | Admitting: Internal Medicine

## 2016-10-14 DIAGNOSIS — I10 Essential (primary) hypertension: Secondary | ICD-10-CM

## 2016-10-17 ENCOUNTER — Ambulatory Visit: Payer: Self-pay

## 2016-10-17 ENCOUNTER — Telehealth: Payer: Self-pay

## 2016-10-17 NOTE — Telephone Encounter (Signed)
Spoke with pt about his medication coming from [facility] and gave him their message. PT verbalized understanding.

## 2016-10-22 ENCOUNTER — Ambulatory Visit: Payer: Self-pay | Admitting: Physician Assistant

## 2016-10-22 DIAGNOSIS — E119 Type 2 diabetes mellitus without complications: Secondary | ICD-10-CM

## 2016-10-22 HISTORY — DX: Type 2 diabetes mellitus without complications: E11.9

## 2016-10-22 LAB — GLUCOSE, POCT (MANUAL RESULT ENTRY): POC Glucose: 109 mg/dl — AB (ref 70–99)

## 2016-10-22 NOTE — Progress Notes (Signed)
Assessment:  Type 2 diabetes mellitus not at goal [facility])     Plan:    1.  Rx changes: Continue current regimen with Lantus 24u qpm as SMBG are stable and less than 130 mg/dl last few days. We discussed symptoms of hypolycemia and he will call clinic if he feels or documents hypoglycemia.  2. RTC 2 weeks for A1c and thyroid labs to confirm ok for lobectomy.   It is my belief that his glucose control is satisfactory to undergo surgery, although it will take a few more weeks for his A1c to fully adjust to depict current level of control.  3. Follow up: I recommend patient follow-up with me at 3 months.      Subjective:    Richard Riley. is a 45 y.o. male who is seen in follow up forType 2 diabetes from [id number]  complicated by hypertension and thyroid malignancy, awaiting surgery but needs better glycemic control prior to surgery.   Current regimen: He began Lantus 2o u qhs on 12/3 and now titrated to 24u qd. Also continues on  Metformin and Januvia.    Patient's glucometer was downloaded and reviewed SMBG values were reviewed with the patient.  Richard Riley. is performing SMBG on average 1 times per day.  FPG Range over last 7  days is 94- 209  Reports no symptoms of hypoglycemia.   Denies polyuria, polydypsia, polyphagia and weight loss Denies chest pain, shortness of breath, edema, foot lesions or ulcers.  Denies severe hypoglycemia or admission to hospital for DKA.  Current exercise:   The patient's history was reviewed and updated as appropriate.  No Known Allergies   Current Outpatient Prescriptions:  .  amoxicillin-clavulanate (AUGMENTIN) 875-125 MG tablet, Take 1 tablet by mouth 2 (two) times daily. (Patient not taking: Reported on 09/24/2016), Disp: 28 tablet, Rfl: 0 .  Insulin Glargine (LANTUS SOLOSTAR) 100 UNIT/ML Solostar Pen, Start with 20 units each evening. Increase dose by 2 units every three days if morning blood sugar over 130, as directed., Disp:  15 mL, Rfl: 11 .  Insulin Pen Needle (PEN NEEDLES) 32G X 5 MM MISC, 1 each by Does not apply route daily., Disp: 50 each, Rfl: 5 .  lisinopril (PRINIVIL,ZESTRIL) 20 MG tablet, TAKE ONE TABLET BY MOUTH EVERY DAY, Disp: 60 tablet, Rfl: 0 .  metFORMIN (GLUCOPHAGE) 1000 MG tablet, Take 1 tablet (1,000 mg total) by mouth 2 (two) times daily with a meal., Disp: 60 tablet, Rfl: 6 .  methimazole (TAPAZOLE) 10 MG tablet, Take 1 tablet (10 mg total) by mouth daily., Disp: 30 tablet, Rfl: 3 .  metoprolol (LOPRESSOR) 50 MG tablet, Take 1 tablet (50 mg total) by mouth 2 (two) times daily., Disp: 60 tablet, Rfl: 2 .  sitaGLIPtin (JANUVIA) 100 MG tablet, Take 1 tablet (100 mg total) by mouth daily., Disp: 90 tablet, Rfl: 1 .  triamterene-hydrochlorothiazide (MAXZIDE-25) 37.5-25 MG tablet, Take 1 tablet by mouth daily. (Patient not taking: Reported on 09/24/2016), Disp: 90 tablet, Rfl: 1  Social History   Social History  . Marital status: Single    Spouse name: N/A  . Number of children: N/A  . Years of education: N/A   Social History Main Topics  . Smoking status: Former Smoker    Quit date: 09/10/2015  . Smokeless tobacco: Current User    Types: Chew, Snuff  . Alcohol use No  . Drug use: No  . Sexual activity: Not on file   Other Topics Concern  .  Not on file   Social History Narrative  . No narrative on file    No family history on file.  Review of Systems A 12 point review of systems was negative except for pertinent items noted in the HPI.   Objective:   BP Readings from Last 3 Encounters:  09/24/16 (!) 148/102  09/18/16 (!) 179/94  09/09/16 (!) 177/100     Wt Readings from Last 3 Encounters:  09/24/16 228 lb (103.4 kg)  09/18/16 229 lb (103.9 kg)  08/21/16 224 lb (101.6 kg)   There were no vitals taken for this visit.                                            Lab Review No components found for: A1C Glucose (mg/dL)  Date Value  10/10/2016 98  07/24/2016  340 (H)  04/16/2016 411 (H)   CO2 (mmol/L)  Date Value  10/10/2016 23  07/24/2016 24  04/16/2016 19   BUN (mg/dL)  Date Value  10/10/2016 17  07/24/2016 20  04/16/2016 20   Creatinine, Ser (mg/dL)  Date Value  10/10/2016 0.61 (L)  07/24/2016 0.66 (L)  04/16/2016 0.69 (L)   No components found for: LDL,  LDLCALC,  LDLDIRECT Lab Results  Component Value Date   NA 143 10/10/2016   K 4.0 10/10/2016   CL 102 10/10/2016   CO2 23 10/10/2016   BUN 17 10/10/2016   CREATININE 0.61 (L) 10/10/2016   GFRAA 139 10/10/2016   GFRNONAA 121 10/10/2016   CALCIUM 9.5 10/10/2016   ALBUMIN 4.5 10/10/2016     DIABETES MELLITUS RESULTS: Lab Results  Component Value Date   HGBA1C 8.7 (H) 10/10/2016   HGBA1C 9.0 09/24/2016   HGBA1C 9.8 (H) 07/24/2016   Lab Results  Component Value Date   CREATININE 0.61 (L) 10/10/2016   Lab Results  Component Value Date   CHOL 150 04/16/2016   No results found for: LDLCALC No components found for: CHOLLLDLDIRECT No components found for: MICROALB/CR Lab Results  Component Value Date   GFRAA 139 10/10/2016   GFRNONAA 121 10/10/2016

## 2016-10-24 ENCOUNTER — Telehealth: Payer: Self-pay | Admitting: Pharmacist

## 2016-10-24 NOTE — Telephone Encounter (Signed)
Lantus Solostar PAP submitted to manufacturer today. Lantus solostar insulin, 20 units, each day/daily, increase by 2 units every 3 days if AM Blood sugar >130.

## 2016-10-31 ENCOUNTER — Other Ambulatory Visit: Payer: Self-pay | Admitting: Internal Medicine

## 2016-10-31 DIAGNOSIS — E119 Type 2 diabetes mellitus without complications: Secondary | ICD-10-CM

## 2016-11-07 ENCOUNTER — Other Ambulatory Visit: Payer: Self-pay

## 2016-11-12 ENCOUNTER — Other Ambulatory Visit: Payer: Self-pay

## 2016-11-12 DIAGNOSIS — E118 Type 2 diabetes mellitus with unspecified complications: Secondary | ICD-10-CM

## 2016-11-13 ENCOUNTER — Ambulatory Visit: Payer: PRIVATE HEALTH INSURANCE | Admitting: Pharmacy Technician

## 2016-11-13 ENCOUNTER — Encounter (INDEPENDENT_AMBULATORY_CARE_PROVIDER_SITE_OTHER): Payer: Self-pay

## 2016-11-13 ENCOUNTER — Encounter: Payer: Self-pay | Admitting: Internal Medicine

## 2016-11-13 ENCOUNTER — Ambulatory Visit: Payer: Self-pay | Admitting: Internal Medicine

## 2016-11-13 VITALS — BP 144/88 | HR 73 | Temp 98.2°F | Wt 232.0 lb

## 2016-11-13 DIAGNOSIS — I10 Essential (primary) hypertension: Secondary | ICD-10-CM

## 2016-11-13 DIAGNOSIS — E0590 Thyrotoxicosis, unspecified without thyrotoxic crisis or storm: Secondary | ICD-10-CM

## 2016-11-13 DIAGNOSIS — E119 Type 2 diabetes mellitus without complications: Secondary | ICD-10-CM

## 2016-11-13 DIAGNOSIS — Z794 Long term (current) use of insulin: Secondary | ICD-10-CM

## 2016-11-13 DIAGNOSIS — Z79899 Other long term (current) drug therapy: Secondary | ICD-10-CM

## 2016-11-13 LAB — TSH: TSH: <0.006 u[IU]/mL — ABNORMAL LOW (ref 0.450–4.500)

## 2016-11-13 LAB — HEMOGLOBIN A1C
Est. average glucose Bld gHb Est-mCnc: 166 mg/dL
HEMOGLOBIN A1C: 7.4 % — AB (ref 4.8–5.6)

## 2016-11-13 LAB — GLUCOSE, POCT (MANUAL RESULT ENTRY): POC Glucose: 167 mg/dl — AB (ref 70–99)

## 2016-11-13 LAB — T4, FREE: FREE T4: 2.23 ng/dL — AB (ref 0.82–1.77)

## 2016-11-13 MED ORDER — METHIMAZOLE 10 MG PO TABS: 10.0000 mg | ORAL_TABLET | Freq: Every day | ORAL | 3 refills | Status: DC

## 2016-11-13 MED ORDER — PEN NEEDLES 32G X 5 MM MISC
1.0000 | Freq: Every day | 5 refills | Status: DC
Start: 2016-11-13 — End: 2017-05-28

## 2016-11-13 MED ORDER — LISINOPRIL 20 MG PO TABS: 20.0000 mg | ORAL_TABLET | Freq: Every day | ORAL | 3 refills | Status: DC

## 2016-11-13 NOTE — Patient Instructions (Signed)
F/u in 2 weeks w/ labs

## 2016-11-13 NOTE — Addendum Note (Signed)
Addended by: Latavius Capizzi C on: 11/13/2016 11:29 AM   Modules accepted: Orders

## 2016-11-13 NOTE — Progress Notes (Signed)
   Subjective:    Patient ID: Richard Riley., male    DOB: [birth year], 46 y.o.   MRN: [id number]  HPI   Pt f/u for hypothyroidism. Lab results show no improvement of TSH with medication. Pt reports shaking has decreased. Pt reports pain in his feet.  A1C has decreased to 7.4   Patient Active Problem List   Diagnosis Date Noted  . Type 2 diabetes mellitus not at goal [facility]) 10/22/2016  . Diabetes ([place]) 07/31/2016   Allergies as of 11/13/2016   No Known Allergies     Medication List       Accurate as of 11/13/16  9:27 AM. Always use your most recent med list.          amoxicillin-clavulanate 875-125 MG tablet Commonly known as:  AUGMENTIN Take 1 tablet by mouth 2 (two) times daily.   Insulin Glargine 100 UNIT/ML Solostar Pen Commonly known as:  LANTUS SOLOSTAR Start with 20 units each evening. Increase dose by 2 units every three days if morning blood sugar over 130, as directed.   lisinopril 20 MG tablet Commonly known as:  PRINIVIL,ZESTRIL TAKE ONE TABLET BY MOUTH EVERY DAY   metFORMIN 1000 MG tablet Commonly known as:  GLUCOPHAGE TAKE ONE TABLET BY MOUTH 2 TIMES A DAY WITH A MEAL.   methimazole 10 MG tablet Commonly known as:  TAPAZOLE Take 1 tablet (10 mg total) by mouth daily.   metoprolol 50 MG tablet Commonly known as:  LOPRESSOR Take 1 tablet (50 mg total) by mouth 2 (two) times daily.   Pen Needles 32G X 5 MM Misc 1 each by Does not apply route daily.   sitaGLIPtin 100 MG tablet Commonly known as:  JANUVIA Take 1 tablet (100 mg total) by mouth daily.   triamterene-hydrochlorothiazide 37.5-25 MG tablet Commonly known as:  MAXZIDE-25 Take 1 tablet by mouth daily.        Review of Systems     Objective:   Physical Exam  Constitutional: He is oriented to person, place, and time.  Cardiovascular: Normal rate, regular rhythm and normal heart sounds.   Pulmonary/Chest: Effort normal and breath sounds normal.  Neurological: He is alert and  oriented to person, place, and time.    BP (!) 144/88   Pulse 73   Temp 98.2 F (36.8 C) (Oral)   Wt 232 lb (105.2 kg)   BMI 31.46 kg/m   Free T4 and TSH levels are not controlled with Tapazole. Plan is to double dose to 2 tablets/day and recheck with 2 week f/u  Sugars are now in therapeutic ranges of treatment. A1C is 7.4. Will continue current diabetic program.  Suggests delay in thyroid surgery until T4 and TSH levels are better controlled.      Assessment & Plan:   F/u in 2 weeks w/ Labs: TSH, Free T4,

## 2016-11-14 NOTE — Progress Notes (Signed)
Completed [facility] application and contract.  Patient agreed to all terms of the [facility] contract.  Patient to provide notarized letter of support and utility bill.  Provided patient with Civil engineer, contracting based on his particular needs.    MTM scheduled for patient on 11/25/16 at 9:00a.m.  [place] [facility]

## 2016-11-19 ENCOUNTER — Ambulatory Visit: Payer: Self-pay

## 2016-11-25 ENCOUNTER — Ambulatory Visit: Payer: PRIVATE HEALTH INSURANCE | Admitting: Pharmacist

## 2016-11-25 ENCOUNTER — Encounter: Payer: Self-pay | Admitting: Pharmacist

## 2016-11-25 VITALS — BP 122/76 | Ht 72.0 in | Wt 231.0 lb

## 2016-11-25 DIAGNOSIS — Z79899 Other long term (current) drug therapy: Secondary | ICD-10-CM

## 2016-11-25 NOTE — Progress Notes (Signed)
[facility] Visit Note  Patient: Richard Riley. MRN: [id number] Date of Birth: [birth year] PCP: No PCP Per Patient   Richard Riley. 46 y.o. male presents for an annual medication review visit today. He is currently being treated for symptomatic hyperthyroidism, surgery pending A1c reduction. He has a follow up lab appointment at [facility] on 11/26/16 and a clinic appointment on 11/28/16.  BP 122/76 (BP Location: Right Arm, Patient Position: Sitting)   Ht 6' (1.829 m)   Wt 231 lb (104.8 kg)   BMI 31.33 kg/m   Patient Information   Past Medical History:  Diagnosis Date  . Diabetes mellitus without complication ([place])   . Hypertension   . Thyroid disease      History reviewed. No pertinent surgical history.   Family History  Problem Relation Age of Onset  . Hypertension Mother   . Hypothyroidism Mother   . Diabetes Father     New Diagnoses (since last visit):   Family Support: Good  Lifestyle Diet: Breakfast: peanut butter sandwich Lunch: hot dog or sandwich  Dinner: baked chicken or hamburger, green beans, corn  Drinks: water, diet soda    Current Exercise Habits: The patient does not participate in regular exercise at present  Exercise limited by: Other - see comments (hyperthyroid; surgery pending)    History  Alcohol Use No      History  Smoking Status  . Former Smoker  . Quit date: 09/10/2015  Smokeless Tobacco  . Current User  . Types: Chew, Snuff      Health Maintenance  Topic Date Due  . PNEUMOCOCCAL POLYSACCHARIDE VACCINE (1) 09/28/1973  . FOOT EXAM  09/28/1981  . OPHTHALMOLOGY EXAM  09/28/1981  . HIV Screening  09/28/1986  . TETANUS/TDAP  09/28/1990  . INFLUENZA VACCINE  06/04/2016  . HEMOGLOBIN A1C  05/12/2017    Prior to Admission medications   Medication Sig Start Date End Date Taking? Authorizing Provider  Insulin Glargine (LANTUS SOLOSTAR) 100 UNIT/ML Solostar Pen Start with 20 units each evening. Increase  dose by 2 units every three days if morning blood sugar over 130, as directed. Patient taking differently: Current dose: 24 units at night. Start with 20 units each evening. Increase dose by 2 units every three days if morning blood sugar over 130, as directed. 09/24/16  Yes Ewell Poe, MD  Insulin Pen Needle (PEN NEEDLES) 32G X 5 MM MISC 1 each by Does not apply route daily. 11/13/16  Yes Tawni Millers, MD  lisinopril (PRINIVIL,ZESTRIL) 20 MG tablet Take 1 tablet (20 mg total) by mouth daily. 11/13/16  Yes Tawni Millers, MD  metFORMIN (GLUCOPHAGE) 1000 MG tablet TAKE ONE TABLET BY MOUTH 2 TIMES A DAY WITH A MEAL. 10/31/16  Yes Tawni Millers, MD  methimazole (TAPAZOLE) 10 MG tablet Take 1 tablet (10 mg total) by mouth daily. Take 2 tables (20mg  total) by mouth daily. 11/13/16  Yes Tawni Millers, MD  metoprolol (LOPRESSOR) 50 MG tablet Take 1 tablet (50 mg total) by mouth 2 (two) times daily. 08/28/16  Yes Tawni Millers, MD  sitaGLIPtin (JANUVIA) 100 MG tablet Take 1 tablet (100 mg total) by mouth daily. 04/30/16  Yes Boyce Medici, FNP      Assessment and Plan: 1. Compliance: taking medications as prescribed. Patient was given a pill box today. 2. Diabetes: A1c reduced from 9.8 to 8.7%. Using Lantus 24 units at night, metformin 1000 mg twice daily and Januvia 100 mg daily.  Checks blood sugars once daily; ranging 110-150 mg/dl in the morning. 3. Hyperthyroidism: symptomatic, prescribed methimazole 20 mg daily and metoprolol 50 mg twice daily. Patient states symptoms of dizziness, shakiness and rapid heart rate have improved with the medication. Thyroid surgery is pending A1c reduction. Follow appointment with [facility] scheduled for 11/26/16 (labs) and 11/28/16, to reassess. Thyroid biopsy of nodule has already been performed. 4. Exercise: not currently exercising due to issues with dizziness and rapid heart rate. Patient interested in restarting exercise. Encouraged patient to start after thyroid surgery,  with provider approval. Start 5-10 minutes per day; long-term goal of 150 minutes per week of cardio exercise. 5. Tobacco cessation: currently using smokeless tobacco. Patient is interested in cessation. Will discuss at next visit. 6. Follow-up: return in 6 months for a follow- up visit with the pharmacist.    Richard Riley. Richard Riley, PharmD [facility] [place] Operations Coordinator [phone]

## 2016-11-26 ENCOUNTER — Other Ambulatory Visit: Payer: Self-pay

## 2016-11-26 DIAGNOSIS — E0590 Thyrotoxicosis, unspecified without thyrotoxic crisis or storm: Secondary | ICD-10-CM

## 2016-11-27 ENCOUNTER — Ambulatory Visit: Payer: Self-pay | Admitting: Internal Medicine

## 2016-11-27 VITALS — BP 148/90 | HR 67 | Temp 97.8°F | Wt 230.0 lb

## 2016-11-27 DIAGNOSIS — E0590 Thyrotoxicosis, unspecified without thyrotoxic crisis or storm: Secondary | ICD-10-CM

## 2016-11-27 DIAGNOSIS — E119 Type 2 diabetes mellitus without complications: Secondary | ICD-10-CM

## 2016-11-27 DIAGNOSIS — Z794 Long term (current) use of insulin: Principal | ICD-10-CM

## 2016-11-27 LAB — CBC WITH DIFFERENTIAL/PLATELET
BASOS ABS: 0 10*3/uL (ref 0.0–0.2)
Basos: 0 %
EOS (ABSOLUTE): 0.2 10*3/uL (ref 0.0–0.4)
Eos: 2 %
HEMOGLOBIN: 15.6 g/dL (ref 13.0–17.7)
Hematocrit: 45.9 % (ref 37.5–51.0)
IMMATURE GRANS (ABS): 0 10*3/uL (ref 0.0–0.1)
Immature Granulocytes: 0 %
LYMPHS: 36 %
Lymphocytes Absolute: 3.5 10*3/uL — ABNORMAL HIGH (ref 0.7–3.1)
MCH: 27.9 pg (ref 26.6–33.0)
MCHC: 34 g/dL (ref 31.5–35.7)
MCV: 82 fL (ref 79–97)
MONOCYTES: 5 %
Monocytes Absolute: 0.5 10*3/uL (ref 0.1–0.9)
NEUTROS ABS: 5.6 10*3/uL (ref 1.4–7.0)
NEUTROS PCT: 57 %
Platelets: 163 10*3/uL (ref 150–379)
RBC: 5.59 x10E6/uL (ref 4.14–5.80)
RDW: 14.3 % (ref 12.3–15.4)
WBC: 9.9 10*3/uL (ref 3.4–10.8)

## 2016-11-27 LAB — GLUCOSE, POCT (MANUAL RESULT ENTRY): POC GLUCOSE: 116 mg/dL — AB (ref 70–99)

## 2016-11-27 LAB — TSH

## 2016-11-27 LAB — T4, FREE: FREE T4: 1.69 ng/dL (ref 0.82–1.77)

## 2016-11-27 MED ORDER — METHIMAZOLE 10 MG PO TABS: ORAL_TABLET | ORAL | 1 refills | Status: DC

## 2016-11-27 NOTE — Progress Notes (Signed)
   Subjective:    Patient ID: Richard Leber., male    DOB: 01-10-1971, 46 y.o.   MRN: [id number]  HPI   Pt f/u for hyperthyroidism Glucose has improved to 116 Free T4 is in a normal range. TSH remains abnormal. Heart rate has come down   Patient Active Problem List   Diagnosis Date Noted  . Type 2 diabetes mellitus not at goal [facility]) 10/22/2016  . Diabetes ([place]) 07/31/2016   Allergies as of 11/27/2016   No Known Allergies     Medication List       Accurate as of 11/27/16 11:00 AM. Always use your most recent med list.          Insulin Glargine 100 UNIT/ML Solostar Pen Commonly known as:  LANTUS SOLOSTAR Start with 20 units each evening. Increase dose by 2 units every three days if morning blood sugar over 130, as directed.   lisinopril 20 MG tablet Commonly known as:  PRINIVIL,ZESTRIL Take 1 tablet (20 mg total) by mouth daily.   metFORMIN 1000 MG tablet Commonly known as:  GLUCOPHAGE TAKE ONE TABLET BY MOUTH 2 TIMES A DAY WITH A MEAL.   methimazole 10 MG tablet Commonly known as:  TAPAZOLE Take 1 tablet (10 mg total) by mouth daily. Take 2 tables (20mg  total) by mouth daily.   metoprolol 50 MG tablet Commonly known as:  LOPRESSOR Take 1 tablet (50 mg total) by mouth 2 (two) times daily.   Pen Needles 32G X 5 MM Misc 1 each by Does not apply route daily.   sitaGLIPtin 100 MG tablet Commonly known as:  JANUVIA Take 1 tablet (100 mg total) by mouth daily.        Review of Systems  Tapazol increased to 4 10 mg tablets daily for a total of 40mg .      Objective:   Physical Exam  Constitutional: He is oriented to person, place, and time.  Cardiovascular: Normal rate, regular rhythm and normal heart sounds.   Pulmonary/Chest: Effort normal and breath sounds normal.  Neurological: He is alert and oriented to person, place, and time.    BP (!) 148/90   Pulse 67   Temp 97.8 F (36.6 C)   Wt 230 lb (104.3 kg)   BMI 31.19 kg/m        Assessment &  Plan:    F/u in 3 weeks w/ labs: Free T4, TSH, A1c, Random sugar

## 2016-12-10 ENCOUNTER — Other Ambulatory Visit: Payer: Self-pay

## 2016-12-10 DIAGNOSIS — Z794 Long term (current) use of insulin: Secondary | ICD-10-CM

## 2016-12-10 DIAGNOSIS — E119 Type 2 diabetes mellitus without complications: Secondary | ICD-10-CM

## 2016-12-10 DIAGNOSIS — E0590 Thyrotoxicosis, unspecified without thyrotoxic crisis or storm: Secondary | ICD-10-CM

## 2016-12-11 LAB — GLUCOSE, RANDOM: GLUCOSE: 82 mg/dL (ref 65–99)

## 2016-12-11 LAB — HEMOGLOBIN A1C
Est. average glucose Bld gHb Est-mCnc: 146 mg/dL
HEMOGLOBIN A1C: 6.7 % — AB (ref 4.8–5.6)

## 2016-12-11 LAB — TSH

## 2016-12-11 LAB — T4, FREE: FREE T4: 1.02 ng/dL (ref 0.82–1.77)

## 2016-12-12 ENCOUNTER — Other Ambulatory Visit: Payer: Self-pay

## 2016-12-12 NOTE — Telephone Encounter (Signed)
Received PAP application from [facility] for Lantus placed for provider to sign. 

## 2016-12-18 ENCOUNTER — Ambulatory Visit: Payer: Self-pay | Admitting: Internal Medicine

## 2016-12-18 ENCOUNTER — Encounter: Payer: Self-pay | Admitting: Internal Medicine

## 2016-12-18 VITALS — BP 175/97 | HR 65 | Temp 97.6°F | Wt 237.0 lb

## 2016-12-18 DIAGNOSIS — E119 Type 2 diabetes mellitus without complications: Secondary | ICD-10-CM

## 2016-12-18 DIAGNOSIS — Z794 Long term (current) use of insulin: Principal | ICD-10-CM

## 2016-12-18 DIAGNOSIS — E0590 Thyrotoxicosis, unspecified without thyrotoxic crisis or storm: Secondary | ICD-10-CM

## 2016-12-18 LAB — GLUCOSE, POCT (MANUAL RESULT ENTRY): POC GLUCOSE: 109 mg/dL — AB (ref 70–99)

## 2016-12-18 MED ORDER — HYDROCHLOROTHIAZIDE 25 MG PO TABS: 25.0000 mg | ORAL_TABLET | Freq: Every day | ORAL | 0 refills | Status: DC

## 2016-12-18 NOTE — Progress Notes (Signed)
   Subjective:    Patient ID: Richard Riley., male    DOB: 1971/02/02, 46 y.o.   MRN: [id number]  HPI   Pt f/u for hyperthyroidism. Needs to be scheduled for surgery.  BP is elevated to 175/97. Pt reports taking medications.   Patient Active Problem List   Diagnosis Date Noted  . Type 2 diabetes mellitus not at goal [facility]) 10/22/2016  . Diabetes ([place]) 07/31/2016   Allergies as of 12/18/2016   No Known Allergies     Medication List       Accurate as of 12/18/16  9:55 AM. Always use your most recent med list.          Insulin Glargine 100 UNIT/ML Solostar Pen Commonly known as:  LANTUS SOLOSTAR Start with 20 units each evening. Increase dose by 2 units every three days if morning blood sugar over 130, as directed.   lisinopril 20 MG tablet Commonly known as:  PRINIVIL,ZESTRIL Take 1 tablet (20 mg total) by mouth daily.   metFORMIN 1000 MG tablet Commonly known as:  GLUCOPHAGE TAKE ONE TABLET BY MOUTH 2 TIMES A DAY WITH A MEAL.   methimazole 10 MG tablet Commonly known as:  TAPAZOLE Take 4 tables ('40mg'$  total) by mouth daily.   metoprolol 50 MG tablet Commonly known as:  LOPRESSOR Take 1 tablet (50 mg total) by mouth 2 (two) times daily.   Pen Needles 32G X 5 MM Misc 1 each by Does not apply route daily.   sitaGLIPtin 100 MG tablet Commonly known as:  JANUVIA Take 1 tablet (100 mg total) by mouth daily.         Review of Systems     Objective:   Physical Exam  Constitutional: He is oriented to person, place, and time.  Cardiovascular: Normal rate, regular rhythm and normal heart sounds.   Pulmonary/Chest: Effort normal and breath sounds normal.  Neurological: He is alert and oriented to person, place, and time.    BP (!) 175/97   Pulse 65   Temp 97.6 F (36.4 C) (Oral)   Wt 237 lb (107.5 kg)   BMI 32.14 kg/m   Thyroid appears to be controlled.  Sugars are under optimal control.  Anticipate him being stable for planned thyroid surgery.       Assessment & Plan:   F/u in 4 weeks post-op w/ labs: Free T4, TSH, Random sugar, CBC, Met C Referral for general surgery

## 2016-12-18 NOTE — Patient Instructions (Addendum)
F/u in 4 weeks w/ labs Referral for general surgery.

## 2016-12-19 ENCOUNTER — Telehealth: Payer: Self-pay

## 2016-12-19 NOTE — Telephone Encounter (Signed)
Leafy Ro (CMA) is calling from [facility] number [phone]   She referred Richard Riley to [place] a couple months ago to be be scheduled for thyroid surgery. We wanted his glucose to go down and his thyroids to become stable before we schedule him. Leafy Ro is wanting to know what we need from her at this point to be able to schedule him for surgery   -- I have reviewed Terrance's chart and do not see any notes from [facility]. I called him to schedule him an appointment with one of our Surgeons. I had to leave him a voicemail. His office notes are in EPIC, last glucose and thyroid tests were done on 12/10/2016 and the next one is scheduled for March. Needs to be scheduled with Dr. Dahlia Byes.

## 2016-12-19 NOTE — Telephone Encounter (Signed)
Spoke with someone at [facility] about scheduling pt for thyroid surgery. They will be calling him for an appt.

## 2016-12-26 NOTE — Telephone Encounter (Signed)
Placed signed application/script in [facility] folder for pickup. 

## 2017-01-02 ENCOUNTER — Other Ambulatory Visit: Payer: Self-pay

## 2017-01-02 DIAGNOSIS — I10 Essential (primary) hypertension: Secondary | ICD-10-CM

## 2017-01-02 DIAGNOSIS — E0590 Thyrotoxicosis, unspecified without thyrotoxic crisis or storm: Secondary | ICD-10-CM

## 2017-01-02 MED ORDER — METHIMAZOLE 10 MG PO TABS: ORAL_TABLET | ORAL | 1 refills | Status: DC

## 2017-01-02 MED ORDER — LISINOPRIL 20 MG PO TABS: 20.0000 mg | ORAL_TABLET | Freq: Every day | ORAL | 3 refills | Status: DC

## 2017-01-03 ENCOUNTER — Telehealth: Payer: Self-pay | Admitting: Pharmacist

## 2017-01-03 NOTE — Telephone Encounter (Signed)
Faxed [employer] a refill request for Lantus Solostar Pens Max daily dose 30 units-Inject 20 units each evening increase by 2 units every three days if AM blood sugar is > 130.

## 2017-01-06 ENCOUNTER — Telehealth: Payer: Self-pay

## 2017-01-06 ENCOUNTER — Encounter: Payer: Self-pay | Admitting: Surgery

## 2017-01-06 ENCOUNTER — Ambulatory Visit (INDEPENDENT_AMBULATORY_CARE_PROVIDER_SITE_OTHER): Payer: PRIVATE HEALTH INSURANCE | Admitting: Surgery

## 2017-01-06 VITALS — BP 148/91 | HR 61 | Temp 97.6°F | Ht 73.0 in | Wt 235.6 lb

## 2017-01-06 DIAGNOSIS — E041 Nontoxic single thyroid nodule: Secondary | ICD-10-CM

## 2017-01-06 NOTE — Progress Notes (Signed)
See previous note

## 2017-01-06 NOTE — Patient Instructions (Addendum)
WE have scheduled  your surgery on 01/23/17. Please see your blue pre-care sheet for surgery information. Please call our office if you have any questions or concerns. WE will schedule an appointment with a Endocrinologist and call you later today with that appointment.      Thyroidectomy A thyroidectomy is a surgery that is done to remove all (total thyroidectomy) or part (subtotal thyroidectomy) of your thyroid gland. The thyroid is a butterfly-shaped gland that is located at the lower front of your neck. It produces a substance that helps to control certain body processes (thyroid hormone). The amount of thyroid gland tissue that is removed during your thyroidectomy depends on the reason you need the procedure. You may have a thyroidectomy to treat conditions including:  Thyroid nodules.  Thyroid cancer.  Benign thyroid tumors.  Goiter.  Overactive thyroid gland (hyperthyroidism). There are different ways to do a thyroidectomy:  Conventional thyroidectomy (open thyroidectomy). This procedure is the most common. In this procedure, the thyroid gland is removed through one surgical cut (incision) in the neck.  Endoscopic thyroidectomy. This procedure is less invasive. In this procedure, there may be several smaller incisions in the neck, chest, or armpit. The surgeon uses a tiny camera and other assistive tools to remove the thyroid gland. Tell a health care provider about:  Any allergies you have.  All medicines you are taking, including vitamins, herbs, eye drops, creams, and over-the-counter medicines.  Any problems you or family members have had with anesthetic medicines.  Any blood disorders you have.  Any surgeries you have had.  Any medical conditions you have. What are the risks? Generally, this is a safe procedure. However, problems can occur and include:  A decrease in parathyroid hormone levels (hypoparathyroidism). Your parathyroid glands are located behind your thyroid  gland, and they maintain the calcium level in your body. If these glands are damaged during surgery, your calcium level will drop. This will make your nerves irritable and cause muscle spasms.  An increase in thyroid hormone.  Damage to the nerves of your voice box (larynx).  Bleeding.  Infection at the site of the incision or incisions.  Temporary breathing difficulties. This is a very rare complication. It usually goes away within weeks. What happens before the procedure?  Your health care provider will perform a physical exam and assess your voice for vocal changes.  Ask your health care provider about:  Changing or stopping your regular medicines. This is especially important if you are taking diabetes medicines or blood thinners.  Taking medicines such as aspirin and ibuprofen. These medicines can thin your blood. Do not take these medicines before your procedure if your health care provider instructs you not to.  Follow instructions from your health care provider about eating or drinking restrictions. What happens during the procedure? You will be given a medicine that makes you go to sleep (general anesthetic). Depending on which type of thyroidectomy you have, this is what may happen during the procedure: Conventional Thyroidectomy   The surgeon will make an incision in the center of your lower neck.  The muscles in your neck will be separated to reveal your thyroid gland.  Part or all of your thyroid gland will be removed.  You may need a tube (catheter) at the incision site to drain blood and fluids that accumulate under the skin after the procedure.The catheter may have to stay in place for a day or two after the procedure.  The incision will be closed with  stitches (sutures). Endoscopic Thyroidectomy   The surgeon will make several small incisions in your neck, chest, or armpit.  The surgeon will use a narrow tube with a light and camera at the end (endoscope). The  surgeon will insert the endoscope into an incision.  Part or all of your thyroid gland will be removed.  You may need a catheter at the incision site to drain blood and fluids that accumulate under the skin after the procedure.The catheter may have to stay in place for a day or two after the procedure.  The incision will be closed with sutures. What happens after the procedure?  Your blood pressure, heart rate, breathing rate, and blood oxygen level will be monitored often until the medicines you were given have worn off.  Depending on the type of thyroidectomy you had, you may have:  A swollen neck.  Some mild neck pain.  A slightly sore throat.  A weak voice.  You will not be able to eat or drink until your health care provider says it is okay.  You may have a blood test to check the level of calcium in your body.  If you had a catheter put in during the procedure, it will usually be removed the next day. This information is not intended to replace advice given to you by your health care provider. Make sure you discuss any questions you have with your health care provider. Document Released: 04/16/2001 Document Revised: 06/23/2016 Document Reviewed: 03/23/2014 Elsevier Interactive Patient Education  2017 [employer].

## 2017-01-06 NOTE — Telephone Encounter (Signed)
Patient is self pay for CPT code [phone]. No insurance listed.

## 2017-01-06 NOTE — Consult Note (Signed)
Patient ID: Richard Waren Jr., male   DOB: 05/27/1971, 46 y.o.   MRN: [id number]  HPI Richard Kasik Jr. is a 46 y.o. male with a history of diabetes and hyperthyroidism seen in consultation at the request of Dr. [name] for a left thyroid nodule. Ultrasound personally reviewed there is evidence of a solid noted on the left inferior area measuring 2.7 x 1.9 cm. She underwent FNA showing suspicious for malignancy Bethesda category 5. His latest TSH was less than 0.006 with a free T4 that was normal. Hemoglobin 1 AC was 6.7. He has good cardiovascular reserve and is able to perform more than 4 Mets without any shortness of breath or chest pain. He has never had any radiation therapy, no history of thyroid cancer or any cancer in the family. He does dip tobacco daily. He was placed on methimazole with good results   HPI  Past Medical History:  Diagnosis Date  . Diabetes (HCC) 07/31/2016  . Diabetes mellitus without complication (HCC)   . Hypertension   . Thyroid disease   . Type 2 diabetes mellitus not at goal (HCC) 10/22/2016    Past Surgical History:  Procedure Laterality Date  . FEMUR FRACTURE SURGERY  child    Family History  Problem Relation Age of Onset  . Hypertension Mother   . Hypothyroidism Mother   . Diabetes Father     Social History Social History  Substance Use Topics  . Smoking status: Former Smoker    Quit date: 09/10/2015  . Smokeless tobacco: Current User    Types: Snuff  . Alcohol use No    No Known Allergies  Current Outpatient Prescriptions  Medication Sig Dispense Refill  . hydrochlorothiazide (HYDRODIURIL) 25 MG tablet Take 1 tablet (25 mg total) by mouth daily. 90 tablet 0  . Insulin Glargine (LANTUS SOLOSTAR) 100 UNIT/ML Solostar Pen Start with 20 units each evening. Increase dose by 2 units every three days if morning blood sugar over 130, as directed. (Patient taking differently: Current dose: 24 units at night. Start with 20 units each evening. Increase  dose by 2 units every three days if morning blood sugar over 130, as directed.) 15 mL 11  . Insulin Pen Needle (PEN NEEDLES) 32G X 5 MM MISC 1 each by Does not apply route daily. 50 each 5  . lisinopril (PRINIVIL,ZESTRIL) 20 MG tablet Take 1 tablet (20 mg total) by mouth daily. 60 tablet 3  . metFORMIN (GLUCOPHAGE) 1000 MG tablet TAKE ONE TABLET BY MOUTH 2 TIMES A DAY WITH A MEAL. 120 tablet 0  . methimazole (TAPAZOLE) 10 MG tablet Take 4 tables (40mg total) by mouth daily. 120 tablet 1  . metoprolol (LOPRESSOR) 50 MG tablet Take 1 tablet (50 mg total) by mouth 2 (two) times daily. 60 tablet 2  . sitaGLIPtin (JANUVIA) 100 MG tablet Take 1 tablet (100 mg total) by mouth daily. 90 tablet 1   No current facility-administered medications for this visit.      Review of Systems A 10 point review of systems was asked and was negative except for the information on the HPI  Physical Exam Blood pressure (!) 148/91, pulse 61, temperature 97.6 F (36.4 C), temperature source Oral, height 6' 1" (1.854 m), weight 106.9 kg (235 lb 9.6 oz). CONSTITUTIONAL: NAD EYES: Pupils are equal, round, and reactive to light, Sclera are non-icteric. EARS, NOSE, MOUTH AND THROAT: The oropharynx is clear. The oral mucosa is pink and moist. Hearing is intact to voice. LYMPH NODES:    Lymph nodes in the neck are normal. NEck: There is a is a left inferior and lateral nodule measuring 3 x 2 cm it is mobile and is consistent with a thyroid nodule. There is no evidence of metastatic disease and there is no lymphadenopathy in the neck. RESPIRATORY:  Lungs are clear. There is normal respiratory effort, with equal breath sounds bilaterally, and without pathologic use of accessory muscles. CARDIOVASCULAR: Heart is regular without murmurs, gallops, or rubs. GI: The abdomen is  soft, nontender, and nondistended. There are no palpable masses. There is no hepatosplenomegaly. There are normal bowel sounds in all quadrants. GU: Rectal  deferred.   MUSCULOSKELETAL: Normal muscle strength and tone. No cyanosis or edema.   SKIN: Turgor is good and there are no pathologic skin lesions or ulcers. NEUROLOGIC: Motor and sensation is grossly normal. Cranial nerves are grossly intact. PSYCH:  Oriented to person, place and time. Affect is normal.  Data Reviewed  I have personally reviewed the patient's imaging, laboratory findings and medical records.    Assessment/Plan 3 cm left-sided thyroid nodule with an FNA suggestive of malignancy on a patient with hyperthyroidism. I discussed with the patient in detail About his disease process. On his workup is already 4 months all therefore I will repeat an ultrasound to make sure that the size has not changed. And to make sure there is no evidence of metastatic disease. And clinically there is no evidence of distant metastasis. Discussed with the patient in detail about options of lobectomy versus total thyroidectomy. Given the fact that he is hyperthyroid and he is interested in total thyroidectomy. Discussed with the patient in detail about the procedure, risk, benefits and possible complications including but not limited to: Bleeding, infection, need for re-interventions, recurrent laryngeal nerve injury and superior laryngeal nerve injury. Complications including respiratory failure needing a tracheostomy as well. He understands and wishes to proceed. We will make sure that he follows with Dr. Kirkman from endocrinology as he may need an adjuvant radioactive iodine. All questions answered and we'll plan for total thyroidectomy as the OR schedule allows in 2-3 weeks. I will also obtain recent labs including a TSH and CMP Dreydon Cardenas, MD FACS General Surgeon 01/06/2017, 12:03 PM   

## 2017-01-06 NOTE — Telephone Encounter (Signed)
Called [facility], left message asking someone to call back. [phone].  Patient needs appointment to see Dr. Rich Reining to discuss Radiation therapy after surgery.    Patient is scheduled for surgery 01/23/17.

## 2017-01-06 NOTE — Telephone Encounter (Signed)
Called to advise patient of Surgery Date as well as Pre-Admission appointment date, time, and location. No answer. Message left on both voicemail numbers.  Surgery Date: 01/23/17  Pre-admit Appointment: 01/16/17 at 1045am (Office)  Patient has been advised to call [phone] the day before surgery between 1-3pm to obtain arrival time.

## 2017-01-07 ENCOUNTER — Telehealth: Payer: Self-pay

## 2017-01-07 NOTE — Telephone Encounter (Signed)
Called patient to inform him he will need to keep his follow up appointments with [facility].  We do need to move the March 21st appointment to March 15th and [facility] was unaware patient had [facility] and will not charge the previously requested $50 co pay.  A copy of the letter was sent to [facility]. If patient comes in or calls please reschedule his office visit from 3/21 to 3/15.

## 2017-01-07 NOTE — Telephone Encounter (Signed)
Spoke with Leafy Ro at [facility]. She has made the patient a follow up appointment on 01/16/17. She also stated the patient has [facility] care from the hospital until May. Leafy Ro will fax a copy of the [place] over to our office today.

## 2017-01-09 NOTE — Telephone Encounter (Signed)
Medical Clearance obtained from DR.Kirkman ([facility]) at this time and will be scanned under Media.

## 2017-01-13 NOTE — Addendum Note (Signed)
Encounter addended by: Vernie Murders, RT on: 01/13/2017 11:00 AM<BR>    Actions taken: Imaging Exam ended, Charge Capture section accepted

## 2017-01-14 ENCOUNTER — Other Ambulatory Visit: Payer: PRIVATE HEALTH INSURANCE

## 2017-01-14 DIAGNOSIS — E0590 Thyrotoxicosis, unspecified without thyrotoxic crisis or storm: Secondary | ICD-10-CM

## 2017-01-15 LAB — CBC
HEMATOCRIT: 48.4 % (ref 37.5–51.0)
HEMOGLOBIN: 16.6 g/dL (ref 13.0–17.7)
MCH: 29.5 pg (ref 26.6–33.0)
MCHC: 34.3 g/dL (ref 31.5–35.7)
MCV: 86 fL (ref 79–97)
Platelets: 140 10*3/uL — ABNORMAL LOW (ref 150–379)
RBC: 5.63 x10E6/uL (ref 4.14–5.80)
RDW: 15.4 % (ref 12.3–15.4)
WBC: 11.6 10*3/uL — ABNORMAL HIGH (ref 3.4–10.8)

## 2017-01-15 LAB — COMPREHENSIVE METABOLIC PANEL
ALBUMIN: 4.3 g/dL (ref 3.5–5.5)
ALT: 23 IU/L (ref 0–44)
AST: 24 IU/L (ref 0–40)
Albumin/Globulin Ratio: 1.4 (ref 1.2–2.2)
Alkaline Phosphatase: 106 IU/L (ref 39–117)
BILIRUBIN TOTAL: 0.9 mg/dL (ref 0.0–1.2)
BUN/Creatinine Ratio: 22 — ABNORMAL HIGH (ref 9–20)
BUN: 19 mg/dL (ref 6–24)
CO2: 27 mmol/L (ref 18–29)
CREATININE: 0.88 mg/dL (ref 0.76–1.27)
Calcium: 9.5 mg/dL (ref 8.7–10.2)
Chloride: 97 mmol/L (ref 96–106)
GFR, EST AFRICAN AMERICAN: 120 mL/min/{1.73_m2} (ref >59)
GFR, EST NON AFRICAN AMERICAN: 104 mL/min/{1.73_m2} (ref >59)
GLUCOSE: 113 mg/dL — AB (ref 65–99)
Globulin, Total: 3 g/dL (ref 1.5–4.5)
Potassium: 3.9 mmol/L (ref 3.5–5.2)
SODIUM: 140 mmol/L (ref 134–144)
Total Protein: 7.3 g/dL (ref 6.0–8.5)

## 2017-01-15 LAB — T4, FREE: Free T4: 0.7 ng/dL — ABNORMAL LOW (ref 0.82–1.77)

## 2017-01-15 LAB — TSH: TSH: 5.05 u[IU]/mL — ABNORMAL HIGH (ref 0.450–4.500)

## 2017-01-16 ENCOUNTER — Inpatient Hospital Stay: Admission: RE | Admit: 2017-01-16 | Payer: PRIVATE HEALTH INSURANCE | Source: Ambulatory Visit

## 2017-01-16 ENCOUNTER — Ambulatory Visit: Payer: PRIVATE HEALTH INSURANCE | Admitting: Adult Health Nurse Practitioner

## 2017-01-16 VITALS — BP 159/94 | HR 76 | Temp 98.4°F | Wt 235.2 lb

## 2017-01-16 DIAGNOSIS — E119 Type 2 diabetes mellitus without complications: Secondary | ICD-10-CM

## 2017-01-16 HISTORY — PX: TOTAL THYROIDECTOMY: SHX2547

## 2017-01-16 LAB — GLUCOSE, POCT (MANUAL RESULT ENTRY): POC GLUCOSE: 87 mg/dL (ref 70–99)

## 2017-01-16 NOTE — Progress Notes (Signed)
  Patient: Richard Riley. Male    DOB: [birth year]   46 y.o.   MRN: [id number] Visit Date: 01/16/2017  Today's Provider: Staci Acosta, NP   Chief Complaint  Patient presents with  . Follow-up   Subjective:    HPI   Pt to have thyroidectomy on 3.22.18.  Reviewed labs.  Taking methimazole.  TSH: 5.050 T4: 0.70.     No Known Allergies Previous Medications   HYDROCHLOROTHIAZIDE (HYDRODIURIL) 25 MG TABLET    Take 1 tablet (25 mg total) by mouth daily.   INSULIN GLARGINE (LANTUS SOLOSTAR) 100 UNIT/ML SOLOSTAR PEN    Start with 20 units each evening. Increase dose by 2 units every three days if morning blood sugar over 130, as directed.   INSULIN PEN NEEDLE (PEN NEEDLES) 32G X 5 MM MISC    1 each by Does not apply route daily.   LISINOPRIL (PRINIVIL,ZESTRIL) 20 MG TABLET    Take 1 tablet (20 mg total) by mouth daily.   METFORMIN (GLUCOPHAGE) 1000 MG TABLET    TAKE ONE TABLET BY MOUTH 2 TIMES A DAY WITH A MEAL.   METHIMAZOLE (TAPAZOLE) 10 MG TABLET    Take 4 tables (40mg  total) by mouth daily.   METOPROLOL (LOPRESSOR) 50 MG TABLET    Take 1 tablet (50 mg total) by mouth 2 (two) times daily.   SITAGLIPTIN (JANUVIA) 100 MG TABLET    Take 1 tablet (100 mg total) by mouth daily.    Review of Systems  All other systems reviewed and are negative.   Social History  Substance Use Topics  . Smoking status: Former Smoker    Quit date: 09/10/2015  . Smokeless tobacco: Current User    Types: Snuff  . Alcohol use No   Objective:   BP (!) 159/94   Pulse 76   Temp 98.4 F (36.9 C)   Wt 235 lb 3.2 oz (106.7 kg)   BMI 31.03 kg/m   Physical Exam  Constitutional: He is oriented to person, place, and time. He appears well-developed and well-nourished.  HENT:  Head: Normocephalic and atraumatic.  Cardiovascular: Normal rate, regular rhythm and normal heart sounds.   Pulmonary/Chest: Effort normal and breath sounds normal.  Abdominal: Soft. Bowel sounds are normal.  Neurological: He  is alert and oriented to person, place, and time.  Skin: Skin is warm and dry.  Vitals reviewed.       Assessment & Plan:         Continue with surgery as scheduled.   Staci Acosta, NP   [facility]

## 2017-01-17 ENCOUNTER — Other Ambulatory Visit: Payer: Self-pay

## 2017-01-17 DIAGNOSIS — I10 Essential (primary) hypertension: Secondary | ICD-10-CM

## 2017-01-17 DIAGNOSIS — E0590 Thyrotoxicosis, unspecified without thyrotoxic crisis or storm: Secondary | ICD-10-CM

## 2017-01-17 DIAGNOSIS — E119 Type 2 diabetes mellitus without complications: Secondary | ICD-10-CM

## 2017-01-17 MED ORDER — METFORMIN HCL 1000 MG PO TABS: ORAL_TABLET | ORAL | 1 refills | Status: DC

## 2017-01-17 MED ORDER — SITAGLIPTIN PHOSPHATE 100 MG PO TABS: 100.0000 mg | ORAL_TABLET | Freq: Every day | ORAL | 1 refills | Status: DC

## 2017-01-17 MED ORDER — METOPROLOL TARTRATE 50 MG PO TABS: 50.0000 mg | ORAL_TABLET | Freq: Two times a day (BID) | ORAL | 2 refills | Status: DC

## 2017-01-21 ENCOUNTER — Other Ambulatory Visit: Payer: Self-pay

## 2017-01-21 ENCOUNTER — Encounter
Admission: RE | Admit: 2017-01-21 | Discharge: 2017-01-21 | Disposition: A | Discharge status: Home / Self Care | Payer: PRIVATE HEALTH INSURANCE | Source: Ambulatory Visit | Attending: Surgery | Admitting: Surgery

## 2017-01-21 DIAGNOSIS — C73 Malignant neoplasm of thyroid gland: Secondary | ICD-10-CM

## 2017-01-21 DIAGNOSIS — Z01818 Encounter for other preprocedural examination: Secondary | ICD-10-CM | POA: Insufficient documentation

## 2017-01-21 HISTORY — DX: Other specified postprocedural states: Z98.890

## 2017-01-21 HISTORY — DX: Other specified postprocedural states: R11.2

## 2017-01-21 NOTE — Patient Instructions (Signed)
  Your procedure is scheduled on: Thurs. 01/23/17 Report to Day Surgery. To find out your arrival time please call [phone] between [place] on Wed. 01/22/17.  Remember: Instructions that are not followed completely may result in serious medical risk, up to and including death, or upon the discretion of your surgeon and anesthesiologist your surgery may need to be rescheduled.    _x___ 1. Do not eat food or drink liquids after midnight. No gum chewing or hard candies.     _x___ 2. No Alcohol for 24 hours before or after surgery.   _x___ 3. Do Not Smoke For 24 Hours Prior to Your Surgery.   ____ 4. Bring all medications with you on the day of surgery if instructed.    __x__ 5. Notify your doctor if there is any change in your medical condition     (cold, fever, infections).       Do not wear jewelry, make-up, hairpins, clips or nail polish.  Do not wear lotions, powders, or perfumes. You may wear deodorant.  Do not shave 48 hours prior to surgery. Men may shave face and neck.  Do not bring valuables to the hospital.    [facility] is not responsible for any belongings or valuables.               Contacts, dentures or bridgework may not be worn into surgery.  Leave your suitcase in the car. After surgery it may be brought to your room.  For patients admitted to the hospital, discharge time is determined by your                treatment team.   Patients discharged the day of surgery will not be allowed to drive home.   Please read over the following fact sheets that you were given:      __x__ Take these medicines the morning of surgery with A SIP OF WATER:    1. lisinopril (PRINIVIL,ZESTRIL) 20 MG tablet  2. metoprolol (LOPRESSOR) 50 MG tablet  3.   4.  5.  6.  ____ Fleet Enema (as directed)   __x__ Use CHG Soap as directed  ____ Use inhalers on the day of surgery  _x___ Stop metformin 2 days prior to surgery NOW    __x__ Take 1/2 of usual insulin dose the night  before surgery and none on the morning of surgery.   ____ Stop Coumadin/Plavix/aspirin on   _x___ Stop Anti-inflammatories on tylenol only.  No Ibuprofen or Aleve or BC    ____ Stop supplements until after surgery.    ____ Bring C-Pap to the hospital.

## 2017-01-22 ENCOUNTER — Ambulatory Visit: Payer: Self-pay | Admitting: Internal Medicine

## 2017-01-23 ENCOUNTER — Ambulatory Visit: Payer: Self-pay | Admitting: Certified Registered"

## 2017-01-23 ENCOUNTER — Observation Stay
Admission: RE | Admit: 2017-01-23 | Discharge: 2017-01-24 | Disposition: A | Discharge status: Home / Self Care | Payer: PRIVATE HEALTH INSURANCE | Source: Ambulatory Visit | Attending: Surgery | Admitting: Surgery

## 2017-01-23 ENCOUNTER — Encounter: Payer: Self-pay | Admitting: *Deleted

## 2017-01-23 ENCOUNTER — Encounter
Admission: RE | Disposition: A | Discharge status: Home / Self Care | Payer: Self-pay | Source: Ambulatory Visit | Attending: Surgery

## 2017-01-23 DIAGNOSIS — E0520 Thyrotoxicosis with toxic multinodular goiter without thyrotoxic crisis or storm: Secondary | ICD-10-CM | POA: Insufficient documentation

## 2017-01-23 DIAGNOSIS — Z87891 Personal history of nicotine dependence: Secondary | ICD-10-CM | POA: Insufficient documentation

## 2017-01-23 DIAGNOSIS — D34 Benign neoplasm of thyroid gland: Principal | ICD-10-CM | POA: Insufficient documentation

## 2017-01-23 DIAGNOSIS — Z79899 Other long term (current) drug therapy: Secondary | ICD-10-CM | POA: Insufficient documentation

## 2017-01-23 DIAGNOSIS — E890 Postprocedural hypothyroidism: Secondary | ICD-10-CM

## 2017-01-23 DIAGNOSIS — E041 Nontoxic single thyroid nodule: Secondary | ICD-10-CM

## 2017-01-23 DIAGNOSIS — E063 Autoimmune thyroiditis: Secondary | ICD-10-CM | POA: Insufficient documentation

## 2017-01-23 DIAGNOSIS — I10 Essential (primary) hypertension: Secondary | ICD-10-CM | POA: Insufficient documentation

## 2017-01-23 DIAGNOSIS — Z9089 Acquired absence of other organs: Secondary | ICD-10-CM

## 2017-01-23 DIAGNOSIS — Z9889 Other specified postprocedural states: Secondary | ICD-10-CM

## 2017-01-23 DIAGNOSIS — Z794 Long term (current) use of insulin: Secondary | ICD-10-CM | POA: Insufficient documentation

## 2017-01-23 DIAGNOSIS — E119 Type 2 diabetes mellitus without complications: Secondary | ICD-10-CM | POA: Insufficient documentation

## 2017-01-23 HISTORY — PX: THYROIDECTOMY: SHX17

## 2017-01-23 LAB — CBC
HCT: 45.5 % (ref 40.0–52.0)
Hemoglobin: 15.5 g/dL (ref 13.0–18.0)
MCH: 29.3 pg (ref 26.0–34.0)
MCHC: 34.2 g/dL (ref 32.0–36.0)
MCV: 85.7 fL (ref 80.0–100.0)
PLATELETS: 133 10*3/uL — AB (ref 150–440)
RBC: 5.3 MIL/uL (ref 4.40–5.90)
RDW: 15.8 % — AB (ref 11.5–14.5)
WBC: 12.5 10*3/uL — ABNORMAL HIGH (ref 3.8–10.6)

## 2017-01-23 LAB — BASIC METABOLIC PANEL
Anion gap: 7 (ref 5–15)
BUN: 25 mg/dL — AB (ref 6–20)
CALCIUM: 8.3 mg/dL — AB (ref 8.9–10.3)
CO2: 25 mmol/L (ref 22–32)
CREATININE: 1.05 mg/dL (ref 0.61–1.24)
Chloride: 103 mmol/L (ref 101–111)
GFR calc Af Amer: >60 mL/min (ref >60)
GLUCOSE: 226 mg/dL — AB (ref 65–99)
Potassium: 3.9 mmol/L (ref 3.5–5.1)
SODIUM: 135 mmol/L (ref 135–145)

## 2017-01-23 LAB — GLUCOSE, CAPILLARY
Glucose-Capillary: 260 mg/dL — ABNORMAL HIGH (ref 65–99)
Glucose-Capillary: 224 mg/dL — ABNORMAL HIGH (ref 65–99)
Glucose-Capillary: 275 mg/dL — ABNORMAL HIGH (ref 65–99)

## 2017-01-23 SURGERY — THYROIDECTOMY
Anesthesia: General | Wound class: Clean

## 2017-01-23 MED ORDER — PHENYLEPHRINE HCL 10 MG/ML IJ SOLN
INTRAMUSCULAR | Status: DC | PRN
  Administered 2017-01-23 (×2): 100 ug via INTRAVENOUS

## 2017-01-23 MED ORDER — LINAGLIPTIN 5 MG PO TABS
5.0000 mg | ORAL_TABLET | Freq: Every day | ORAL | Status: DC
Start: 2017-01-24 — End: 2017-01-24

## 2017-01-23 MED ORDER — ONDANSETRON HCL 4 MG/2ML IJ SOLN: 4.0000 mg | Freq: Four times a day (QID) | INTRAMUSCULAR | Status: DC | PRN

## 2017-01-23 MED ORDER — REMIFENTANIL HCL 1 MG IV SOLR
INTRAVENOUS | Status: AC
  Filled 2017-01-23: qty 2000

## 2017-01-23 MED ORDER — METOPROLOL TARTRATE 50 MG PO TABS
50.0000 mg | ORAL_TABLET | Freq: Two times a day (BID) | ORAL | Status: DC
  Administered 2017-01-23: 50 mg via ORAL
  Filled 2017-01-23: qty 1

## 2017-01-23 MED ORDER — FENTANYL CITRATE (PF) 100 MCG/2ML IJ SOLN
25.0000 ug | INTRAMUSCULAR | Status: DC | PRN
  Administered 2017-01-23 (×2): 25 ug via INTRAVENOUS
  Administered 2017-01-23: 50 ug via INTRAVENOUS

## 2017-01-23 MED ORDER — REMIFENTANIL HCL 1 MG IV SOLR
INTRAVENOUS | Status: DC | PRN
  Administered 2017-01-23: .2 ug/kg/min via INTRAVENOUS

## 2017-01-23 MED ORDER — SUGAMMADEX SODIUM 200 MG/2ML IV SOLN
INTRAVENOUS | Status: DC | PRN
  Administered 2017-01-23: 200 mg via INTRAVENOUS

## 2017-01-23 MED ORDER — MIDAZOLAM HCL 2 MG/2ML IJ SOLN
INTRAMUSCULAR | Status: DC | PRN
  Administered 2017-01-23: 2 mg via INTRAVENOUS

## 2017-01-23 MED ORDER — HYDROCHLOROTHIAZIDE 25 MG PO TABS: 25.0000 mg | ORAL_TABLET | Freq: Every day | ORAL | Status: DC

## 2017-01-23 MED ORDER — OXYCODONE HCL 5 MG PO TABS: 5.0000 mg | ORAL_TABLET | Freq: Once | ORAL | Status: DC | PRN

## 2017-01-23 MED ORDER — OXYCODONE-ACETAMINOPHEN 5-325 MG PO TABS
1.0000 | ORAL_TABLET | ORAL | Status: DC | PRN
  Administered 2017-01-23: 2 via ORAL
  Filled 2017-01-23: qty 2

## 2017-01-23 MED ORDER — SODIUM CHLORIDE 0.9 % IV SOLN
INTRAVENOUS | Status: DC
  Administered 2017-01-23: 08:00:00 via INTRAVENOUS

## 2017-01-23 MED ORDER — ACETAMINOPHEN 10 MG/ML IV SOLN
INTRAVENOUS | Status: DC | PRN
  Administered 2017-01-23: 1000 mg via INTRAVENOUS

## 2017-01-23 MED ORDER — ONDANSETRON HCL 4 MG/2ML IJ SOLN
INTRAMUSCULAR | Status: AC
  Filled 2017-01-23: qty 2

## 2017-01-23 MED ORDER — FENTANYL CITRATE (PF) 250 MCG/5ML IJ SOLN
INTRAMUSCULAR | Status: AC
  Filled 2017-01-23: qty 5

## 2017-01-23 MED ORDER — PROPOFOL 10 MG/ML IV BOLUS
INTRAVENOUS | Status: DC | PRN
  Administered 2017-01-23: 200 mg via INTRAVENOUS

## 2017-01-23 MED ORDER — METFORMIN HCL 500 MG PO TABS
1000.0000 mg | ORAL_TABLET | Freq: Two times a day (BID) | ORAL | Status: DC
  Administered 2017-01-23 – 2017-01-24 (×2): 1000 mg via ORAL
  Filled 2017-01-23 (×2): qty 2

## 2017-01-23 MED ORDER — FAMOTIDINE 20 MG PO TABS
20.0000 mg | ORAL_TABLET | Freq: Once | ORAL | Status: AC
  Administered 2017-01-23: 20 mg via ORAL

## 2017-01-23 MED ORDER — ONDANSETRON 4 MG PO TBDP: 4.0000 mg | ORAL_TABLET | Freq: Four times a day (QID) | ORAL | Status: DC | PRN

## 2017-01-23 MED ORDER — SUCCINYLCHOLINE CHLORIDE 20 MG/ML IJ SOLN
INTRAMUSCULAR | Status: DC | PRN
  Administered 2017-01-23: 120 mg via INTRAVENOUS

## 2017-01-23 MED ORDER — ROCURONIUM BROMIDE 100 MG/10ML IV SOLN
INTRAVENOUS | Status: DC | PRN
  Administered 2017-01-23: 30 mg via INTRAVENOUS

## 2017-01-23 MED ORDER — SODIUM CHLORIDE 0.9 % IV SOLN
INTRAVENOUS | Status: DC | PRN
  Administered 2017-01-23: 40 ug/min via INTRAVENOUS

## 2017-01-23 MED ORDER — LISINOPRIL 20 MG PO TABS: 20.0000 mg | ORAL_TABLET | Freq: Every day | ORAL | Status: DC

## 2017-01-23 MED ORDER — MORPHINE SULFATE (PF) 2 MG/ML IV SOLN
2.0000 mg | INTRAVENOUS | Status: DC | PRN
  Administered 2017-01-24: 2 mg via INTRAVENOUS
  Filled 2017-01-23: qty 1

## 2017-01-23 MED ORDER — ONDANSETRON HCL 4 MG/2ML IJ SOLN
INTRAMUSCULAR | Status: DC | PRN
  Administered 2017-01-23: 4 mg via INTRAVENOUS

## 2017-01-23 MED ORDER — OXYCODONE HCL 5 MG/5ML PO SOLN: 5.0000 mg | Freq: Once | ORAL | Status: DC | PRN

## 2017-01-23 MED ORDER — KETOROLAC TROMETHAMINE 30 MG/ML IJ SOLN: 30.0000 mg | Freq: Four times a day (QID) | INTRAMUSCULAR | Status: DC | PRN

## 2017-01-23 MED ORDER — KETOROLAC TROMETHAMINE 30 MG/ML IJ SOLN
30.0000 mg | Freq: Four times a day (QID) | INTRAMUSCULAR | Status: DC | PRN
  Administered 2017-01-23: 30 mg via INTRAVENOUS

## 2017-01-23 MED ORDER — PROMETHAZINE HCL 25 MG/ML IJ SOLN
6.2500 mg | INTRAMUSCULAR | Status: DC | PRN
Start: 2017-01-23 — End: 2017-01-23

## 2017-01-23 MED ORDER — ENOXAPARIN SODIUM 40 MG/0.4ML ~~LOC~~ SOLN
40.0000 mg | SUBCUTANEOUS | Status: DC
  Filled 2017-01-23: qty 0.4

## 2017-01-23 MED ORDER — FENTANYL CITRATE (PF) 100 MCG/2ML IJ SOLN
INTRAMUSCULAR | Status: DC | PRN
  Administered 2017-01-23 (×2): 50 ug via INTRAVENOUS
  Administered 2017-01-23: 150 ug via INTRAVENOUS

## 2017-01-23 MED ORDER — SODIUM CHLORIDE FLUSH 0.9 % IV SOLN
INTRAVENOUS | Status: AC
  Filled 2017-01-23: qty 10

## 2017-01-23 MED ORDER — ACETAMINOPHEN 10 MG/ML IV SOLN
INTRAVENOUS | Status: AC
  Filled 2017-01-23: qty 100

## 2017-01-23 MED ORDER — INSULIN GLARGINE 100 UNIT/ML ~~LOC~~ SOLN
24.0000 [IU] | Freq: Every day | SUBCUTANEOUS | Status: DC
  Filled 2017-01-23: qty 0.24

## 2017-01-23 MED ORDER — KETOROLAC TROMETHAMINE 30 MG/ML IJ SOLN
INTRAMUSCULAR | Status: AC
  Filled 2017-01-23: qty 1

## 2017-01-23 MED ORDER — CHLORHEXIDINE GLUCONATE CLOTH 2 % EX PADS: 6.0000 | MEDICATED_PAD | Freq: Once | CUTANEOUS | Status: DC

## 2017-01-23 MED ORDER — BUPIVACAINE-EPINEPHRINE (PF) 0.25% -1:200000 IJ SOLN
INTRAMUSCULAR | Status: AC
  Filled 2017-01-23: qty 30

## 2017-01-23 MED ORDER — OXYCODONE HCL 5 MG PO TABS: 5.0000 mg | ORAL_TABLET | ORAL | Status: DC | PRN

## 2017-01-23 MED ORDER — CEFAZOLIN SODIUM-DEXTROSE 2-4 GM/100ML-% IV SOLN
2.0000 g | INTRAVENOUS | Status: AC
  Administered 2017-01-23: 2 g via INTRAVENOUS

## 2017-01-23 MED ORDER — FAMOTIDINE 20 MG PO TABS
ORAL_TABLET | ORAL | Status: AC
  Administered 2017-01-23: 20 mg via ORAL
  Filled 2017-01-23: qty 1

## 2017-01-23 MED ORDER — MEPERIDINE HCL 50 MG/ML IJ SOLN: 6.2500 mg | INTRAMUSCULAR | Status: DC | PRN

## 2017-01-23 MED ORDER — PANTOPRAZOLE SODIUM 40 MG IV SOLR
40.0000 mg | Freq: Every day | INTRAVENOUS | Status: DC
  Filled 2017-01-23: qty 40

## 2017-01-23 MED ORDER — PANTOPRAZOLE SODIUM 40 MG IV SOLR
40.0000 mg | Freq: Every day | INTRAVENOUS | Status: DC
  Administered 2017-01-23: 40 mg via INTRAVENOUS
  Filled 2017-01-23: qty 40

## 2017-01-23 MED ORDER — FENTANYL CITRATE (PF) 100 MCG/2ML IJ SOLN
INTRAMUSCULAR | Status: AC
  Filled 2017-01-23: qty 2

## 2017-01-23 MED ORDER — CEFAZOLIN SODIUM-DEXTROSE 2-4 GM/100ML-% IV SOLN
INTRAVENOUS | Status: AC
Start: 2017-01-23 — End: 2017-01-23
  Administered 2017-01-23: 2 g via INTRAVENOUS
  Filled 2017-01-23: qty 100

## 2017-01-23 MED ORDER — PHENYLEPHRINE HCL 10 MG/ML IJ SOLN
INTRAMUSCULAR | Status: AC
  Filled 2017-01-23: qty 1

## 2017-01-23 MED ORDER — FENTANYL CITRATE (PF) 100 MCG/2ML IJ SOLN
INTRAMUSCULAR | Status: AC
  Administered 2017-01-23: 50 ug via INTRAVENOUS
  Filled 2017-01-23: qty 2

## 2017-01-23 MED ORDER — ACETAMINOPHEN 500 MG PO TABS: 1000.0000 mg | ORAL_TABLET | Freq: Four times a day (QID) | ORAL | Status: DC

## 2017-01-23 MED ORDER — ENOXAPARIN SODIUM 40 MG/0.4ML ~~LOC~~ SOLN: 40.0000 mg | SUBCUTANEOUS | Status: DC

## 2017-01-23 MED ORDER — LIDOCAINE HCL (PF) 1 % IJ SOLN
INTRAMUSCULAR | Status: AC
  Filled 2017-01-23: qty 2

## 2017-01-23 MED ORDER — MORPHINE SULFATE (PF) 4 MG/ML IV SOLN: 2.0000 mg | INTRAVENOUS | Status: DC | PRN

## 2017-01-23 MED ORDER — SUCCINYLCHOLINE CHLORIDE 20 MG/ML IJ SOLN
INTRAMUSCULAR | Status: AC
  Filled 2017-01-23: qty 1

## 2017-01-23 SURGICAL SUPPLY — 35 items
ADHESIVE MASTISOL STRL (MISCELLANEOUS) IMPLANT
APPLIER CLIP 9.375 SM OPEN (CLIP) ×3
BLADE SURG 15 STRL LF DISP TIS (BLADE) ×1 IMPLANT
BLADE SURG 15 STRL SS (BLADE) ×2
CANISTER SUCT 1200ML W/VALVE (MISCELLANEOUS) ×3 IMPLANT
CHLORAPREP W/TINT 26ML (MISCELLANEOUS) ×3 IMPLANT
CLIP APPLIE 9.375 SM OPEN (CLIP) ×1 IMPLANT
CLOSURE WOUND 1/2 X4 (GAUZE/BANDAGES/DRESSINGS)
DRAPE LAPAROTOMY 100X77 ABD (DRAPES) ×3 IMPLANT
ELECT REM PT RETURN 9FT ADLT (ELECTROSURGICAL) ×3
ELECTRODE REM PT RTRN 9FT ADLT (ELECTROSURGICAL) ×1 IMPLANT
GAUZE SPONGE 4X4 12PLY STRL (GAUZE/BANDAGES/DRESSINGS) IMPLANT
GLOVE BIO SURGEON STRL SZ8 (GLOVE) IMPLANT
GOWN STRL REUS W/ TWL LRG LVL3 (GOWN DISPOSABLE) ×4 IMPLANT
GOWN STRL REUS W/TWL LRG LVL3 (GOWN DISPOSABLE) ×8
HARMONIC SCALPEL FOCUS (MISCELLANEOUS) ×3 IMPLANT
KIT RM TURNOVER STRD PROC AR (KITS) ×3 IMPLANT
LABEL OR SOLS (LABEL) ×3 IMPLANT
NS IRRIG 500ML POUR BTL (IV SOLUTION) ×3 IMPLANT
PACK BASIN MINOR ARMC (MISCELLANEOUS) ×3 IMPLANT
SPONGE KITTNER 5P (MISCELLANEOUS) ×9 IMPLANT
SPONGE LAP 18X18 5 PK (GAUZE/BANDAGES/DRESSINGS) ×3 IMPLANT
SPONGE XRAY 4X4 16PLY STRL (MISCELLANEOUS) ×3 IMPLANT
STRIP CLOSURE SKIN 1/2X4 (GAUZE/BANDAGES/DRESSINGS) IMPLANT
SUT MNCRL 4-0 (SUTURE) ×2
SUT MNCRL 4-0 27XMFL (SUTURE) ×1
SUT SILK 0 (SUTURE) ×2
SUT SILK 0 30XBRD TIE 6 (SUTURE) ×1 IMPLANT
SUT SILK 2 0 (SUTURE)
SUT SILK 2-0 18XBRD TIE 12 (SUTURE) IMPLANT
SUT SILK 3 0 (SUTURE) ×2
SUT SILK 3-0 18XBRD TIE 12 (SUTURE) ×1 IMPLANT
SUT VICRYL 3-0 CR8 SH (SUTURE) ×3 IMPLANT
SUTURE MNCRL 4-0 27XMF (SUTURE) ×1 IMPLANT
TOWEL OR 17X26 4PK STRL BLUE (TOWEL DISPOSABLE) ×3 IMPLANT

## 2017-01-23 NOTE — Anesthesia Post-op Follow-up Note (Cosign Needed)
Anesthesia QCDR form completed.        

## 2017-01-23 NOTE — Op Note (Signed)
Total Thyroidectomy  Richard Riley.  01/23/2017  Pre-operative Diagnosis: Right thyroid nodule suspicious for malignancy Thyroid  Goiter w hyperthyroidism  Post-operative Diagnosis: Same  Procedure: Total thyroidectomy  Surgeon: Caroleen Hamman, MD FACS  Anesthesia: Gen. with endotracheal tube  Assistant:Dr. Oaks  EBL: 25cc   Findings:  Left large dominant nodule                   Chronic inflammatory changes both lobes                   Right multinodular goiter with some hard nodules ?                            Parathyroid vs node vs thyroid CA     Procedure Details  The patient was seen again in the Holding Room. The benefits, complications, treatment options, and expected outcomes were discussed with the patient. The risks of bleeding, infection, recurrence of symptoms, failure to resolve symptoms, recurrence of hernia, ischemic orchitis, chronic pain syndrome or neuroma, were discussed again. The likelihood of improving the patient's symptoms with return to their baseline status is good.  The patient and/or family concurred with the proposed plan, giving informed consent.  The patient was taken to Operating Room, identified as Richard Riley. and the procedure verified   A Time Out was held and the above information confirmed.  Prior to the induction of general anesthesia, antibiotic prophylaxis was administered. VTE prophylaxis was in place. General endotracheal anesthesia was then administered and tolerated well. After the induction, the neck  was prepped with Chloraprep and draped in the sterile fashion. The patient was positioned in the supine position.  Collar incision was created with 15 blade knife 2 fingerbreadths above the suprasternal notch. Electrocautery was used to dissect through subcutaneous tissue and to develop subplatysmal flaps inferiorly and superiorly. The strap muscles were identified and the midline raphae was in size using electrocautery. Strap muscles were  retracted laterally and the thyroid was dissected from the adjacent structures using a combination of Kitner dissection and Harmonic scalpel. We mobilized the left thyroid lobe first in a lateral to medial fashion and we were able to clearly identified the left recurrent laryngeal nerve and preserve it at all times. We preserved and visualized the left inferior parathyroid gland. Using the Harmonic's scalpel were able to divide all the branches including the superior polel pedicle. She was turned to the right side and we mobilized the thyroid in identical fashion. We were also able to identify the recurrent laryngeal nerve on the right side and preserved at all times. On the right side on the superior pole there was some thickening and some nodules that I was not able to distinguish between an node versus some thyroid nodule potentially reminiscence of cancer versus parathyroid. We excised in a standard fashion using electrocautery. We were able to remove the specimen and oriented appropriately. Inspection of the wound revealed adequate hemostasis there was some small oozing at the thymus that were we were able to control with electrocautery. The platysma was closed in a running fashion with the brachial sutures and the skin approximated with 4-0 Monocryl in a subcuticular fashion. Dermabond was used to coat the skin. Needle and laparotomy counts were correct   Patient tolerated the procedure well. There were no complications. He was taken to the recovery room in stable condition.  Caroleen Hamman, MD, FACS

## 2017-01-23 NOTE — Interval H&P Note (Signed)
History and Physical Interval Note:  01/23/2017 8:24 AM  Richard Riley.  has presented today for surgery, with the diagnosis of THYROID CANCER  The various methods of treatment have been discussed with the patient and family. After consideration of risks, benefits and other options for treatment, the patient has consented to  Procedure(s): THYROIDECTOMY (N/A) as a surgical intervention .  The patient's history has been reviewed, patient examined, no change in status, stable for surgery.  I have reviewed the patient's chart and labs.  Questions were answered to the patient's satisfaction.     [place]

## 2017-01-23 NOTE — Anesthesia Procedure Notes (Signed)
Procedure Name: Intubation Performed by: Lance Muss Pre-anesthesia Checklist: Patient identified, Patient being monitored, Timeout performed, Emergency Drugs available and Suction available Patient Re-evaluated:Patient Re-evaluated prior to inductionOxygen Delivery Method: Circle system utilized Preoxygenation: Pre-oxygenation with 100% oxygen Intubation Type: IV induction Ventilation: Mask ventilation without difficulty, Oral airway inserted - appropriate to patient size and Two handed mask ventilation required Laryngoscope Size: McGraph and 4 Grade View: Grade II Tube type: Oral Tube size: 7.5 mm Number of attempts: 1 Airway Equipment and Method: Stylet and Video-laryngoscopy Placement Confirmation: ETT inserted through vocal cords under direct vision,  positive ETCO2 and breath sounds checked- equal and bilateral Secured at: 22 cm Tube secured with: Tape Dental Injury: Teeth and Oropharynx as per pre-operative assessment  Difficulty Due To: Difficult Airway- due to anterior larynx Future Recommendations: Recommend- induction with short-acting agent, and alternative techniques readily available

## 2017-01-23 NOTE — Anesthesia Postprocedure Evaluation (Signed)
Anesthesia Post Note  Patient: Richard Riley.  Procedure(s) Performed: Procedure(s) (LRB): THYROIDECTOMY (N/A)  Patient location during evaluation: PACU Anesthesia Type: General Level of consciousness: awake and alert and oriented Pain management: pain level controlled Vital Signs Assessment: post-procedure vital signs reviewed and stable Respiratory status: spontaneous breathing, nonlabored ventilation and respiratory function stable Cardiovascular status: blood pressure returned to baseline and stable Postop Assessment: no signs of nausea or vomiting Anesthetic complications: no     Last Vitals:  Vitals:   01/23/17 1215 01/23/17 1230  BP: (!) 151/96 128/90  Pulse: 72 71  Resp: 13 (!) 0  Temp:      Last Pain:  Vitals:   01/23/17 1230  TempSrc:   PainSc: 5                  Ginnifer Creelman

## 2017-01-23 NOTE — H&P (View-Only) (Signed)
Patient ID: Richard Riley., male   DOB: 17-Sep-1971, 46 y.o.   MRN: [id number]  HPI Richard Riley. is a 46 y.o. male with a history of diabetes and hyperthyroidism seen in consultation at the request of Dr. Mable Fill for a left thyroid nodule. Ultrasound personally reviewed there is evidence of a solid noted on the left inferior area measuring 2.7 x 1.9 cm. She underwent FNA showing suspicious for malignancy Bethesda category 5. His latest TSH was less than 0.006 with a free T4 that was normal. Hemoglobin 1 AC was 6.7. He has good cardiovascular reserve and is able to perform more than 4 Mets without any shortness of breath or chest pain. He has never had any radiation therapy, no history of thyroid cancer or any cancer in the family. He does dip tobacco daily. He was placed on methimazole with good results   HPI  Past Medical History:  Diagnosis Date  . Diabetes ([place]) 07/31/2016  . Diabetes mellitus without complication ([place])   . Hypertension   . Thyroid disease   . Type 2 diabetes mellitus not at goal [facility]) 10/22/2016    Past Surgical History:  Procedure Laterality Date  . FEMUR FRACTURE SURGERY  child    Family History  Problem Relation Age of Onset  . Hypertension Mother   . Hypothyroidism Mother   . Diabetes Father     Social History Social History  Substance Use Topics  . Smoking status: Former Smoker    Quit date: 09/10/2015  . Smokeless tobacco: Current User    Types: Snuff  . Alcohol use No    No Known Allergies  Current Outpatient Prescriptions  Medication Sig Dispense Refill  . hydrochlorothiazide (HYDRODIURIL) 25 MG tablet Take 1 tablet (25 mg total) by mouth daily. 90 tablet 0  . Insulin Glargine (LANTUS SOLOSTAR) 100 UNIT/ML Solostar Pen Start with 20 units each evening. Increase dose by 2 units every three days if morning blood sugar over 130, as directed. (Patient taking differently: Current dose: 24 units at night. Start with 20 units each evening. Increase  dose by 2 units every three days if morning blood sugar over 130, as directed.) 15 mL 11  . Insulin Pen Needle (PEN NEEDLES) 32G X 5 MM MISC 1 each by Does not apply route daily. 50 each 5  . lisinopril (PRINIVIL,ZESTRIL) 20 MG tablet Take 1 tablet (20 mg total) by mouth daily. 60 tablet 3  . metFORMIN (GLUCOPHAGE) 1000 MG tablet TAKE ONE TABLET BY MOUTH 2 TIMES A DAY WITH A MEAL. 120 tablet 0  . methimazole (TAPAZOLE) 10 MG tablet Take 4 tables (40mg  total) by mouth daily. 120 tablet 1  . metoprolol (LOPRESSOR) 50 MG tablet Take 1 tablet (50 mg total) by mouth 2 (two) times daily. 60 tablet 2  . sitaGLIPtin (JANUVIA) 100 MG tablet Take 1 tablet (100 mg total) by mouth daily. 90 tablet 1   No current facility-administered medications for this visit.      Review of Systems A 10 point review of systems was asked and was negative except for the information on the HPI  Physical Exam Blood pressure (!) 148/91, pulse 61, temperature 97.6 F (36.4 C), temperature source Oral, height 6\' 1"  (1.854 m), weight 106.9 kg (235 lb 9.6 oz). CONSTITUTIONAL: NAD EYES: Pupils are equal, round, and reactive to light, Sclera are non-icteric. EARS, NOSE, MOUTH AND THROAT: The oropharynx is clear. The oral mucosa is pink and moist. Hearing is intact to voice. LYMPH NODES:  Lymph nodes in the neck are normal. NEck: There is a is a left inferior and lateral nodule measuring 3 x 2 cm it is mobile and is consistent with a thyroid nodule. There is no evidence of metastatic disease and there is no lymphadenopathy in the neck. RESPIRATORY:  Lungs are clear. There is normal respiratory effort, with equal breath sounds bilaterally, and without pathologic use of accessory muscles. CARDIOVASCULAR: Heart is regular without murmurs, gallops, or rubs. GI: The abdomen is  soft, nontender, and nondistended. There are no palpable masses. There is no hepatosplenomegaly. There are normal bowel sounds in all quadrants. GU: Rectal  deferred.   MUSCULOSKELETAL: Normal muscle strength and tone. No cyanosis or edema.   SKIN: Turgor is good and there are no pathologic skin lesions or ulcers. NEUROLOGIC: Motor and sensation is grossly normal. Cranial nerves are grossly intact. PSYCH:  Oriented to person, place and time. Affect is normal.  Data Reviewed  I have personally reviewed the patient's imaging, laboratory findings and medical records.    Assessment/Plan 3 cm left-sided thyroid nodule with an FNA suggestive of malignancy on a patient with hyperthyroidism. I discussed with the patient in detail About his disease process. On his workup is already 4 months all therefore I will repeat an ultrasound to make sure that the size has not changed. And to make sure there is no evidence of metastatic disease. And clinically there is no evidence of distant metastasis. Discussed with the patient in detail about options of lobectomy versus total thyroidectomy. Given the fact that he is hyperthyroid and he is interested in total thyroidectomy. Discussed with the patient in detail about the procedure, risk, benefits and possible complications including but not limited to: Bleeding, infection, need for re-interventions, recurrent laryngeal nerve injury and superior laryngeal nerve injury. Complications including respiratory failure needing a tracheostomy as well. He understands and wishes to proceed. We will make sure that he follows with Dr. Rich Reining from endocrinology as he may need an adjuvant radioactive iodine. All questions answered and we'll plan for total thyroidectomy as the OR schedule allows in 2-3 weeks. I will also obtain recent labs including a TSH and CMP Caroleen Hamman, MD FACS General Surgeon 01/06/2017, 12:03 PM

## 2017-01-23 NOTE — Transfer of Care (Signed)
Immediate Anesthesia Transfer of Care Note  Patient: Richard Riley.  Procedure(s) Performed: Procedure(s): THYROIDECTOMY (N/A)  Patient Location: PACU  Anesthesia Type:General  Level of Consciousness: awake and responds to stimulation  Airway & Oxygen Therapy: Patient Spontanous Breathing and Patient connected to face mask oxygen  Post-op Assessment: Report given to RN and Post -op Vital signs reviewed and stable  Post vital signs: Reviewed and stable  Last Vitals:  Vitals:   01/23/17 1159 01/23/17 1201  BP:  (!) 146/85  Pulse:  71  Resp:  12  Temp: (P) 36.1 C     Last Pain:  Vitals:   01/23/17 0804  TempSrc: Oral         Complications: No apparent anesthesia complications

## 2017-01-23 NOTE — Anesthesia Preprocedure Evaluation (Signed)
Anesthesia Evaluation  Patient identified by MRN, date of birth, ID band Patient awake    Reviewed: Allergy & Precautions, NPO status , Patient's Chart, lab work & pertinent test results  History of Anesthesia Complications (+) PONV and history of anesthetic complications  Airway Mallampati: IV  TM Distance: >3 FB Neck ROM: Full    Dental no notable dental hx.    Pulmonary Sleep apnea: not diagnosed but likely based on hx. , neg COPD, former smoker,    breath sounds clear to auscultation- rhonchi (-) wheezing      Cardiovascular hypertension, Pt. on medications (-) CAD and (-) Past MI  Rhythm:Regular Rate:Normal - Systolic murmurs and - Diastolic murmurs    Neuro/Psych negative neurological ROS  negative psych ROS   GI/Hepatic negative GI ROS, Neg liver ROS,   Endo/Other  diabetes, Insulin Dependent  Renal/GU negative Renal ROS     Musculoskeletal negative musculoskeletal ROS (+)   Abdominal (+) + obese,   Peds  Hematology negative hematology ROS (+)   Anesthesia Other Findings Past Medical History: 07/31/2016: Diabetes ([place]) No date: Diabetes mellitus without complication (HCC) No date: Hypertension No date: Hyperthyroidism No date: PONV (postoperative nausea and vomiting)     Comment: mild N/V after surgery 30 years ago No date: Thyroid disease 10/22/2016: Type 2 diabetes mellitus not at goal [facility])   Reproductive/Obstetrics                             Anesthesia Physical Anesthesia Plan  ASA: II  Anesthesia Plan: General   Post-op Pain Management:    Induction: Intravenous  Airway Management Planned: Oral ETT and Video Laryngoscope Planned  Additional Equipment:   Intra-op Plan:   Post-operative Plan: Extubation in OR  Informed Consent: I have reviewed the patients History and Physical, chart, labs and discussed the procedure including the risks, benefits and  alternatives for the proposed anesthesia with the patient or authorized representative who has indicated his/her understanding and acceptance.   Dental advisory given  Plan Discussed with: CRNA and Anesthesiologist  Anesthesia Plan Comments:         Anesthesia Quick Evaluation

## 2017-01-24 ENCOUNTER — Encounter: Payer: Self-pay | Admitting: Surgery

## 2017-01-24 LAB — BASIC METABOLIC PANEL
Anion gap: 5 (ref 5–15)
BUN: 22 mg/dL — ABNORMAL HIGH (ref 6–20)
CALCIUM: 8.4 mg/dL — AB (ref 8.9–10.3)
CO2: 29 mmol/L (ref 22–32)
CREATININE: 1.02 mg/dL (ref 0.61–1.24)
Chloride: 103 mmol/L (ref 101–111)
GLUCOSE: 171 mg/dL — AB (ref 65–99)
Potassium: 4 mmol/L (ref 3.5–5.1)
Sodium: 137 mmol/L (ref 135–145)

## 2017-01-24 LAB — GLUCOSE, CAPILLARY
Glucose-Capillary: 218 mg/dL — ABNORMAL HIGH (ref 65–99)
Glucose-Capillary: 161 mg/dL — ABNORMAL HIGH (ref 65–99)

## 2017-01-24 LAB — HIV ANTIBODY (ROUTINE TESTING W REFLEX): HIV SCREEN 4TH GENERATION: NONREACTIVE

## 2017-01-24 MED ORDER — OXYCODONE-ACETAMINOPHEN 5-325 MG PO TABS
1.0000 | ORAL_TABLET | ORAL | 0 refills | Status: DC | PRN
Start: 2017-01-24 — End: 2017-02-24

## 2017-01-24 NOTE — Progress Notes (Signed)
Inpatient Diabetes Program Recommendations  AACE/ADA: New Consensus Statement on Inpatient Glycemic Control (2015)  Target Ranges:  Prepandial:   less than 140 mg/dL      Peak postprandial:   less than 180 mg/dL (1-2 hours)      Critically ill patients:  140 - 180 mg/dL   Results for CARRIE, SCHOONMAKER (MRN [id number]) as of 01/24/2017 09:57  Ref. Range 01/23/2017 08:02 01/23/2017 12:05 01/23/2017 17:06 01/23/2017 20:21 01/24/2017 08:05  Glucose-Capillary Latest Ref Range: 65 - 99 mg/dL 260 (H) 224 (H) 218 (H) 275 (H) 161 (H)   Review of Glycemic Control  Diabetes history: DM2 Outpatient Diabetes medications: Lantus 24 units QHS, Januvia 100 mg daily, Metformin 1000 mg BID Current orders for Inpatient glycemic control: Lantus 24 units QHS, Tradjenta 5 mg daily, Metformin 1000 mg BID  Inpatient Diabetes Program Recommendations: Correction (SSI): Please consider ordering CBGs with Novolog correction scale ACHS (using Glycemic Control order set). Oral Agents: While inpatient, please consider discontinuing oral DM medications.  Thanks, Barnie Alderman, RN, MSN, CDE Diabetes Coordinator Inpatient Diabetes Program [phone] (Team Pager from 8am to 5pm)

## 2017-01-24 NOTE — Discharge Summary (Signed)
Patient ID: [name]. MRN: [id number] DOB/AGE: Jun 30, 1971 46 y.o.  Admit date: 01/23/2017 Discharge date: 01/24/2017   Discharge Diagnoses:  Active Problems:   S/P thyroidectomy   Thyroid nodule   Procedures:Total thyroidectomy  Hospital Course: 46 year old male with hyperthyroidism, goiter and a left thyroid nodule suspicious for malignancy with Bethesda classification 5. He underwent a total thyroidectomy and was uneventful. Did not have any postoperative issues. His airway was pristine and all times and his calcium did drop some twos 8.6 but I am following calcium was ended up and more importantly he did not have any signs and symptoms of hypocalcemia. At the time of discharge she was ambulating, tolerating regular diet. Vital signs were stable and he was afebrile. Physical exam showed a male in no acute distress, awake and alert. His neck reveals an incision without evidence of any expanding hematoma, infection or airway compromise. There was a little bit of swelling postoperatively that was suspected. His voice was normal and there was no stridor. Chest was clear to auscultation. Abd: soft, NT. Condition at discharge was stable. We will repeat some labs in the outpatient setting and await final pathology before starting supplementation of Synthroid.  Condition at the time of discharge  Is stable   Disposition: 01-Home or Self Care  Discharge Instructions    Call MD for:  difficulty breathing, headache or visual disturbances    Complete by:  As directed    Call MD for:  extreme fatigue    Complete by:  As directed    Call MD for:  hives    Complete by:  As directed    Call MD for:  persistant dizziness or light-headedness    Complete by:  As directed    Call MD for:  persistant nausea and vomiting    Complete by:  As directed    Call MD for:  redness, tenderness, or signs of infection (pain, swelling, redness, odor or green/yellow discharge around incision site)     Complete by:  As directed    Call MD for:  severe uncontrolled pain    Complete by:  As directed    Call MD for:  temperature >100.4    Complete by:  As directed    Diet - low sodium heart healthy    Complete by:  As directed    Increase activity slowly    Complete by:  As directed    Lifting restrictions    Complete by:  As directed    20 lbs x 2 weeks     Allergies as of 01/24/2017   No Known Allergies     Medication List    STOP taking these medications   methimazole 10 MG tablet Commonly known as:  TAPAZOLE     TAKE these medications   hydrochlorothiazide 25 MG tablet Commonly known as:  HYDRODIURIL Take 1 tablet (25 mg total) by mouth daily.   Insulin Glargine 100 UNIT/ML Solostar Pen Commonly known as:  LANTUS SOLOSTAR Start with 20 units each evening. Increase dose by 2 units every three days if morning blood sugar over 130, as directed. What changed:  how much to take  when to take this  additional instructions   lisinopril 20 MG tablet Commonly known as:  PRINIVIL,ZESTRIL Take 1 tablet (20 mg total) by mouth daily.   metFORMIN 1000 MG tablet Commonly known as:  GLUCOPHAGE TAKE ONE TABLET BY MOUTH 2 TIMES A DAY WITH A MEAL. What changed:  how much to take  when to take this  additional instructions   metoprolol 50 MG tablet Commonly known as:  LOPRESSOR Take 1 tablet (50 mg total) by mouth 2 (two) times daily.   oxyCODONE-acetaminophen 5-325 MG tablet Commonly known as:  PERCOCET/ROXICET Take 1-2 tablets by mouth every 4 (four) hours as needed for moderate pain.   Pen Needles 32G X 5 MM Misc 1 each by Does not apply route daily.   sitaGLIPtin 100 MG tablet Commonly known as:  JANUVIA Take 1 tablet (100 mg total) by mouth daily.      Follow-up Information    Jules Husbands, MD. Go on 02/12/2017.   Specialty:  General Surgery Why:  Wednesday at 10:00am for hospital follow-up Contact information: [address]  [place] [address] [phone]        Ewell Poe, MD. Schedule an appointment as soon as possible for a visit in 1 week(s).   Specialty:  Endocrinology Why:  1-2 wks Contact information: [place] [address] [phone]            Caroleen Hamman, MD FACS

## 2017-01-24 NOTE — Progress Notes (Signed)
Patient discharged to home as ordered, follow up appointments given as ordered. Incision to neck is clean, dry and intact, no redness or swelling noted. Patient is afebrile, VSS, Patient mother at the bedside to take patient home. Prescription given to patient for pain. No complaints of pain voiced on discharged.

## 2017-01-28 ENCOUNTER — Other Ambulatory Visit: Payer: Self-pay

## 2017-01-28 ENCOUNTER — Telehealth: Payer: Self-pay

## 2017-01-28 DIAGNOSIS — E890 Postprocedural hypothyroidism: Secondary | ICD-10-CM

## 2017-01-28 NOTE — Telephone Encounter (Signed)
Spoke with Dr. Dahlia Byes and he would like patient to have a CBC, CMP, Thyroglobulin, TSH, T3, and Free T4 prior to post-op appointment.  Orders placed.

## 2017-01-29 NOTE — Telephone Encounter (Signed)
Call made to patient at this time. He is doing very well. Has no complaints at this time. I explained to him that he would need to come in on 02/11/17 to [place] to have labs drawn so that they are available for his appointment on 4/11. Patient verbalizes understanding.  We also spoke about his Endocrinology appointment at [facility]. He states that someone for [facility] called and he has an appointment in May. I informed patient that I would follow-up on this.  Call made to [facility] at this time. No answer. Left voicemail for return phone call.

## 2017-01-30 ENCOUNTER — Telehealth: Payer: Self-pay

## 2017-01-30 ENCOUNTER — Other Ambulatory Visit: Payer: Self-pay

## 2017-01-30 DIAGNOSIS — E119 Type 2 diabetes mellitus without complications: Secondary | ICD-10-CM

## 2017-01-30 LAB — SURGICAL PATHOLOGY

## 2017-01-30 MED ORDER — SITAGLIPTIN PHOSPHATE 100 MG PO TABS: 100.0000 mg | ORAL_TABLET | Freq: Every day | ORAL | 1 refills | Status: DC

## 2017-01-30 NOTE — Telephone Encounter (Signed)
Call received from [facility] from [facility]. She states that patient cannot be moved up as they do not have but one day each month that the endocrinologist comes to the clinic. However, patient has been placed to see PCP on 02/26/17 to check on labs and overall patient care since Total Thyroidectomy.

## 2017-01-30 NOTE — Telephone Encounter (Signed)
Patient called and left a voicemail on 01/29/17 at 10:32 PM concerning his surgery. He is swelling still around thhe area where the thyroidectomy was performed. I called Leane Para back and left him a Advertising account executive. I stated that it is perfectly normal for this swelling to occur. In fact, he may experience the swelling for 1-2 months after the surgery until everything heals up. I advised to call me if has any further questions or needs some clarification

## 2017-01-30 NOTE — Telephone Encounter (Signed)
Called pt regarding path results. c/w goiter and no CA. May start synthroid. He is doing ok, some swelling. No resp issues, voice is normal and he is eating well.. F/U next week

## 2017-01-30 NOTE — Telephone Encounter (Signed)
PT has called several times with questions regarding appointments and medicines he is on. I spoke with Dr. Mable Fill pt wanted to know If he should continue his metoprolol. Per Dr. Mable Fill he is to continue that med its for his blood pressure. The pt has also asked about having an appt made with the endocrinologist doctor. We are booked up in April so I could not get him in until May. [facility] has also called wanting him to get in sooner than that so we discussed why he could not and decided to just make an appt with Dr. Mable Fill which the pt has one in April and we will keep that one to discuss his ongoing care plan. [facility] also stated that if the pathology report comes back cancerous they will make the referral for oncology and the radioactive iodine if he needs that. Called pt and left message with details.

## 2017-01-30 NOTE — Telephone Encounter (Signed)
Call made to [facility] at this time [phone]. No answer. Left voicemail for return phone call.

## 2017-01-31 ENCOUNTER — Encounter: Payer: Self-pay | Admitting: Surgery

## 2017-02-03 ENCOUNTER — Telehealth: Payer: Self-pay

## 2017-02-03 MED ORDER — LEVOTHYROXINE SODIUM 125 MCG PO TABS
125.0000 ug | ORAL_TABLET | Freq: Every day | ORAL | 0 refills | Status: DC
Start: 2017-02-03 — End: 2017-03-18

## 2017-02-03 NOTE — Telephone Encounter (Signed)
Spoke with Dr. Dahlia Byes in regards to patient's pathology. He would like to start patient on Synthroid 158mcg Daily and see patient on 02/07/17 in office. Appointment made.  Call made to patient. He would like Synthroid sent to [facility]. This was done. He was told of his appointment time and location on 4/6. I also re-iterated that he will need to have his labs drawn 24 hours prior to appointment so that they will be resulted. Patient verbalizes understanding and will start Synthroid today.

## 2017-02-04 ENCOUNTER — Ambulatory Visit: Payer: PRIVATE HEALTH INSURANCE

## 2017-02-06 ENCOUNTER — Other Ambulatory Visit
Admission: RE | Admit: 2017-02-06 | Discharge: 2017-02-06 | Disposition: A | Discharge status: Home / Self Care | Payer: PRIVATE HEALTH INSURANCE | Source: Ambulatory Visit | Attending: Surgery | Admitting: Surgery

## 2017-02-06 DIAGNOSIS — E890 Postprocedural hypothyroidism: Secondary | ICD-10-CM | POA: Insufficient documentation

## 2017-02-06 LAB — COMPREHENSIVE METABOLIC PANEL
ALBUMIN: 3.9 g/dL (ref 3.5–5.0)
ALK PHOS: 78 U/L (ref 38–126)
ALT: 26 U/L (ref 17–63)
ANION GAP: 8 (ref 5–15)
AST: 33 U/L (ref 15–41)
BILIRUBIN TOTAL: 1.1 mg/dL (ref 0.3–1.2)
BUN: 25 mg/dL — ABNORMAL HIGH (ref 6–20)
CALCIUM: 9.6 mg/dL (ref 8.9–10.3)
CO2: 31 mmol/L (ref 22–32)
Chloride: 96 mmol/L — ABNORMAL LOW (ref 101–111)
Creatinine, Ser: 1.17 mg/dL (ref 0.61–1.24)
Glucose, Bld: 199 mg/dL — ABNORMAL HIGH (ref 65–99)
Potassium: 3.8 mmol/L (ref 3.5–5.1)
SODIUM: 135 mmol/L (ref 135–145)
TOTAL PROTEIN: 7.4 g/dL (ref 6.5–8.1)

## 2017-02-06 LAB — CBC WITH DIFFERENTIAL/PLATELET
BASOS ABS: 0.1 10*3/uL (ref 0–0.1)
BASOS PCT: 1 %
EOS ABS: 0.5 10*3/uL (ref 0–0.7)
Eosinophils Relative: 4 %
HEMATOCRIT: 52.1 % — AB (ref 40.0–52.0)
HEMOGLOBIN: 17.5 g/dL (ref 13.0–18.0)
Lymphocytes Relative: 32 %
Lymphs Abs: 4.3 10*3/uL — ABNORMAL HIGH (ref 1.0–3.6)
MCH: 29.3 pg (ref 26.0–34.0)
MCHC: 33.6 g/dL (ref 32.0–36.0)
MCV: 87.4 fL (ref 80.0–100.0)
Monocytes Absolute: 0.5 10*3/uL (ref 0.2–1.0)
Monocytes Relative: 4 %
NEUTROS ABS: 8.1 10*3/uL — AB (ref 1.4–6.5)
NEUTROS PCT: 59 %
Platelets: 178 10*3/uL (ref 150–440)
RBC: 5.97 MIL/uL — ABNORMAL HIGH (ref 4.40–5.90)
RDW: 15.5 % — ABNORMAL HIGH (ref 11.5–14.5)
WBC: 13.6 10*3/uL — AB (ref 3.8–10.6)

## 2017-02-06 LAB — TSH: TSH: 17.03 u[IU]/mL — AB (ref 0.350–4.500)

## 2017-02-06 LAB — T4, FREE

## 2017-02-07 ENCOUNTER — Telehealth: Payer: Self-pay

## 2017-02-07 ENCOUNTER — Ambulatory Visit (INDEPENDENT_AMBULATORY_CARE_PROVIDER_SITE_OTHER): Payer: PRIVATE HEALTH INSURANCE | Admitting: Surgery

## 2017-02-07 ENCOUNTER — Encounter: Payer: Self-pay | Admitting: Surgery

## 2017-02-07 VITALS — BP 201/117 | HR 67 | Temp 97.7°F | Ht 73.0 in | Wt 251.4 lb

## 2017-02-07 DIAGNOSIS — Z09 Encounter for follow-up examination after completed treatment for conditions other than malignant neoplasm: Secondary | ICD-10-CM

## 2017-02-07 LAB — T3: T3, Total: <20 ng/dL — ABNORMAL LOW (ref 71–180)

## 2017-02-07 NOTE — Telephone Encounter (Signed)
Error

## 2017-02-07 NOTE — Progress Notes (Signed)
s/p total thyroidectomy for presumed malignant nodule Path c/u multinodular goiter and adenoma, no CA TSH high and low t4, he just started synthroid today Swallowing well, no voice issues, some swelling CA is nml  PE NAD Incision c/d/I, there is a seroma, no airway compromise. using U/S I aspirated 40 cc serous fluid.  No infection.  A/P doing well, seroma aspirated Path d/w pt in detail Continue synthroid current dose F/U endocrine RTC next week HTN per PCP No need for radioactive iodine

## 2017-02-07 NOTE — Telephone Encounter (Signed)
Spoke with patient's mother Vaughan Basta) and provided follow up appointment listed below. Dr.Pabon-02/12/17 @ 10:00 [place] office.

## 2017-02-07 NOTE — Patient Instructions (Signed)
[facility] is closed on Friday. We would like for you to go to the Emergency room due to the severe headache and elevated blood pressure.  Please see your follow up appointment listed below. Please call our office if you have questions or concerns.

## 2017-02-08 IMAGING — NM NM THYROID IMAGING W/ UPTAKE MULTI (4&24 HR)
1 series · 3 of 3 positions shown · non-contrast
Comparison: None.

CLINICAL DATA: Clinical Hyperthyroidism.

EXAM:
THYROID SCAN AND UPTAKE - 4 AND 24 HOURS
TECHNIQUE: Following oral administration of I 123 capsule, anterior planar
imaging was acquired at 24 hours. Thyroid uptake was calculated with
a thyroid probe at 4-6 hours and 24 hours.
RADIOPHARMACEUTICALS:  140.7 uCi I -123

[Series 1000: (id) thyroid scan · 2.40mm/px · 3 of 3 slices shown]
[im 1/3  full-range]
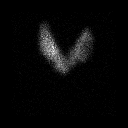
[im 2/3  full-range]
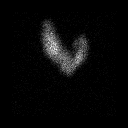
[im 3/3  full-range]
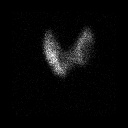

[3 of 3 positions shown; findings below may reference images not displayed]

FINDINGS: Normal thyroid size. Small cold defects involving the left lobe of
the and the left aspect of the isthmus. Recommend correlation with
thyroid ultrasound..

4 hour I 123 uptake = 40.4% (normal 5-20%)

24 hour I 123 uptake = 62.8% (normal 10-30%)
IMPRESSION: Elevated 4 and 24 hour I 123 uptake consistent with hyperthyroidism.

Cold thyroid nodules. Recommend correlation with thyroid ultrasound.

## 2017-02-11 ENCOUNTER — Telehealth: Payer: Self-pay | Admitting: Surgery

## 2017-02-11 ENCOUNTER — Ambulatory Visit: Payer: PRIVATE HEALTH INSURANCE | Admitting: Family Medicine

## 2017-02-11 DIAGNOSIS — E890 Postprocedural hypothyroidism: Secondary | ICD-10-CM

## 2017-02-11 DIAGNOSIS — I10 Essential (primary) hypertension: Secondary | ICD-10-CM

## 2017-02-11 DIAGNOSIS — Z720 Tobacco use: Secondary | ICD-10-CM

## 2017-02-11 DIAGNOSIS — E079 Disorder of thyroid, unspecified: Secondary | ICD-10-CM

## 2017-02-11 DIAGNOSIS — Z5181 Encounter for therapeutic drug level monitoring: Secondary | ICD-10-CM

## 2017-02-11 DIAGNOSIS — Z9889 Other specified postprocedural states: Secondary | ICD-10-CM

## 2017-02-11 HISTORY — DX: Tobacco use: Z72.0

## 2017-02-11 LAB — THYROGLOBULIN LEVEL: Thyroglobulin: 3.9 ng/mL

## 2017-02-11 MED ORDER — LISINOPRIL 20 MG PO TABS: 30.0000 mg | ORAL_TABLET | Freq: Every day | ORAL | 1 refills | Status: DC

## 2017-02-11 NOTE — Assessment & Plan Note (Addendum)
Increase lisinopril from 20 mg to 30 mg daily; return in 2 weeks; I wanted to see him back in 1 week, but he said he already had an upcoming appt in 2 weeks and he agrees to monitor BP at pharmacy and contact us if not to goal; DASH guidelines encouraged; smoking cessation encouraged

## 2017-02-11 NOTE — Assessment & Plan Note (Signed)
Check Cr and K+ 

## 2017-02-11 NOTE — Progress Notes (Signed)
BP (!) 163/99   Pulse 72   Temp 98.4 F (36.9 C)   Ht 6' (1.829 m)   Wt 252 lb 8 oz (114.5 kg)   BMI 34.25 kg/m    Subjective:    Patient ID: Richard Leber., male    DOB: September 15, 1971, 46 y.o.   MRN: [id number]  HPI: Richard Matheson. is a 46 y.o. male  Chief Complaint  Patient presents with  . Pre-visit Screening Tool Documentation    Blood work   He has had a little sore throat the past few days, no trouble swallowing No fevers, no change in voice He had an overactive thyroid, had an overactive thyroid, then got an [place] and then they found he had 2.7 cm thyroid nodule No thyroid nodules in the family Going back to see surgeon tomorrow and just needs labs done He had labs done on 02/06/17 but he hadn't started the medicine The surgeon wants his labs rechecked before tomorrow's visits He has been on his medicine now about 4 days  High blood pressure; 207/117 at Dr. Corlis Leak office; his head that day and next day was terrible, pounding; feeling better now They wanted him to go to the ER but he didn't go On metoprolol BID  Type 2 diabetes; on insulin; checking sugars every other day  Smoking just 1-2 cigarettes a day; not much time around other smokers; being bored contributes  Relevant past medical, surgical, family and social history reviewed Past Medical History:  Diagnosis Date  . Heart murmur   . Hypertension   . PONV (postoperative nausea and vomiting)    mild N/V after surgery 30 years ago  . Sleep apnea   . Thyroid disease   . Tobacco abuse 02/11/2017  . Type 2 diabetes mellitus not at goal [facility]) 10/22/2016   Past Surgical History:  Procedure Laterality Date  . FEMUR FRACTURE SURGERY  child  . THYROIDECTOMY N/A 01/23/2017   Procedure: THYROIDECTOMY;  Surgeon: Jules Husbands, MD;  Location: [facility] ORS;  Service: General;  Laterality: N/A;  . TOTAL THYROIDECTOMY  01/16/2017   Family History  Problem Relation Age of Onset  . Hypertension Mother   . Hypothyroidism  Mother   . Diabetes Father    Social History  Substance Use Topics  . Smoking status: Former Smoker    Packs/day: 0.10    Types: Cigarettes    Quit date: 09/10/2015  . Smokeless tobacco: Current User    Types: Snuff  . Alcohol use 1.2 oz/week    2 Cans of beer per week     Comment: rarely   Interim medical history since last visit reviewed. Allergies and medications reviewed  Review of Systems Per HPI unless specifically indicated above     Objective:    BP (!) 163/99   Pulse 72   Temp 98.4 F (36.9 C)   Ht 6' (1.829 m)   Wt 252 lb 8 oz (114.5 kg)   BMI 34.25 kg/m   Wt Readings from Last 3 Encounters:  02/11/17 252 lb 8 oz (114.5 kg)  02/07/17 251 lb 6.4 oz (114 kg)  01/23/17 235 lb (106.6 kg)    Physical Exam  Constitutional: He appears well-developed and well-nourished. No distress.  Eyes: No scleral icterus.  Neck:  Surgical site C/D/I, large seroma present, no drainage; normal voice  Cardiovascular: Normal rate and regular rhythm.   Pulmonary/Chest: Effort normal and breath sounds normal.  Neurological: He is alert.  Psychiatric: He has a  normal mood and affect.   Results for orders placed or performed during the hospital encounter of 02/06/17  Comprehensive metabolic panel  Result Value Ref Range   Sodium 135 135 - 145 mmol/L   Potassium 3.8 3.5 - 5.1 mmol/L   Chloride 96 (L) 101 - 111 mmol/L   CO2 31 22 - 32 mmol/L   Glucose, Bld 199 (H) 65 - 99 mg/dL   BUN 25 (H) 6 - 20 mg/dL   Creatinine, Ser 1.17 0.61 - 1.24 mg/dL   Calcium 9.6 8.9 - 10.3 mg/dL   Total Protein 7.4 6.5 - 8.1 g/dL   Albumin 3.9 3.5 - 5.0 g/dL   AST 33 15 - 41 U/L   ALT 26 17 - 63 U/L   Alkaline Phosphatase 78 38 - 126 U/L   Total Bilirubin 1.1 0.3 - 1.2 mg/dL   GFR calc non Af Amer >60 >60 mL/min   GFR calc Af Amer >60 >60 mL/min   Anion gap 8 5 - 15  CBC with Differential  Result Value Ref Range   WBC 13.6 (H) 3.8 - 10.6 K/uL   RBC 5.97 (H) 4.40 - 5.90 MIL/uL   Hemoglobin  17.5 13.0 - 18.0 g/dL   HCT 52.1 (H) 40.0 - 52.0 %   MCV 87.4 80.0 - 100.0 fL   MCH 29.3 26.0 - 34.0 pg   MCHC 33.6 32.0 - 36.0 g/dL   RDW 15.5 (H) 11.5 - 14.5 %   Platelets 178 150 - 440 K/uL   Neutrophils Relative % 59 %   Neutro Abs 8.1 (H) 1.4 - 6.5 K/uL   Lymphocytes Relative 32 %   Lymphs Abs 4.3 (H) 1.0 - 3.6 K/uL   Monocytes Relative 4 %   Monocytes Absolute 0.5 0.2 - 1.0 K/uL   Eosinophils Relative 4 %   Eosinophils Absolute 0.5 0 - 0.7 K/uL   Basophils Relative 1 %   Basophils Absolute 0.1 0 - 0.1 K/uL  TSH  Result Value Ref Range   TSH 17.030 (H) 0.350 - 4.500 uIU/mL  T4, free  Result Value Ref Range   Free T4 <0.25 (L) 0.61 - 1.12 ng/dL  T3  Result Value Ref Range   T3, Total <20 (L) 71 - 180 ng/dL  Thyroglobulin Level  Result Value Ref Range   Thyroglobulin 3.9 ng/mL      Assessment & Plan:   Problem List Items Addressed This Visit      Cardiovascular and Mediastinum   Essential hypertension, benign    Increase lisinopril from 20 mg to 30 mg daily; return in 2 weeks; I wanted to see him back in 1 week, but he said he already had an upcoming appt in 2 weeks and he agrees to monitor BP at pharmacy and contact us if not to goal; DASH guidelines encouraged; smoking cessation encouraged      Relevant Medications   lisinopril (PRINIVIL,ZESTRIL) 20 MG tablet   Other Relevant Orders   Basic Metabolic Panel (BMET)     Other   Tobacco abuse    Encouragement given to quit; see AVS      S/P thyroidectomy    I'll reluctantly check TSH tonight, as patient is sure that surgeon wanted this rechecked before his appointment tomorrow; I explained that this usually takes 6 weeks and he'll need recheck labs in several more weeks; f/u with surgeon tomorrow      Relevant Orders   TSH   Medication monitoring encounter    Check  Cr and K+      Relevant Orders   Basic Metabolic Panel (BMET)    Other Visit Diagnoses    Essential hypertension       Relevant  Medications   lisinopril (PRINIVIL,ZESTRIL) 20 MG tablet      Follow up plan: Return in about 2 weeks (around 02/25/2017) for Dr. Madilyn Hook.  An after-visit summary was printed and given to the patient at [place].  Please see the patient instructions which may contain other information and recommendations beyond what is mentioned above in the assessment and plan.  Meds ordered this encounter  Medications  . lisinopril (PRINIVIL,ZESTRIL) 20 MG tablet    Sig: Take 1.5 tablets (30 mg total) by mouth daily.    Dispense:  45 tablet    Refill:  1    This is an [facility] patient. Increasing the amount    Orders Placed This Encounter  Procedures  . TSH  . Basic Metabolic Panel (BMET)

## 2017-02-11 NOTE — Telephone Encounter (Signed)
No Labs this visit, however I made patient an appointment [facility] 02/11/17 @ 6:00 pm for his blood pressure to be evaluated.  Left message on home and cell numbers of appointment information.

## 2017-02-11 NOTE — Assessment & Plan Note (Addendum)
I'll reluctantly check TSH tonight, as patient is sure that surgeon wanted this rechecked before his appointment tomorrow; I explained that this usually takes 6 weeks and he'll need recheck labs in several more weeks; f/u with surgeon tomorrow

## 2017-02-11 NOTE — Assessment & Plan Note (Signed)
Encouragement given to quit; see AVS 

## 2017-02-11 NOTE — Patient Instructions (Signed)
I do encourage you to quit smoking Call [phone] to sign up for smoking cessation classes You can call 1-800-QUIT-NOW to talk with a smoking cessation coach  Your goal blood pressure is less than 130 mmHg on top. Try to follow the DASH guidelines (DASH stands for Dietary Approaches to Stop Hypertension) Try to limit the sodium in your diet.  Ideally, consume less than 1.5 grams (less than 1,500mg ) per day. Do not add salt when cooking or at the table.  Check the sodium amount on labels when shopping, and choose items lower in sodium when given a choice. Avoid or limit foods that already contain a lot of sodium. Eat a diet rich in fruits and vegetables and whole grains.  Increase the lisinopril to 30 mg daily Check your blood pressure and call us if not to goal Return to see provider in 2 weeks  DASH Eating Plan DASH stands for "Dietary Approaches to Stop Hypertension." The DASH eating plan is a healthy eating plan that has been shown to reduce high blood pressure (hypertension). It may also reduce your risk for type 2 diabetes, heart disease, and stroke. The DASH eating plan may also help with weight loss. What are tips for following this plan? General guidelines   Avoid eating more than 2,300 mg (milligrams) of salt (sodium) a day. If you have hypertension, you may need to reduce your sodium intake to 1,500 mg a day.  Limit alcohol intake to no more than 1 drink a day for nonpregnant women and 2 drinks a day for men. One drink equals 12 oz of beer, 5 oz of wine, or 1 oz of hard liquor.  Work with your health care provider to maintain a healthy body weight or to lose weight. Ask what an ideal weight is for you.  Get at least 30 minutes of exercise that causes your heart to beat faster (aerobic exercise) most days of the week. Activities may include walking, swimming, or biking.  Work with your health care provider or diet and nutrition specialist (dietitian) to adjust your eating plan  to your individual calorie needs. Reading food labels   Check food labels for the amount of sodium per serving. Choose foods with less than 5 percent of the Daily Value of sodium. Generally, foods with less than 300 mg of sodium per serving fit into this eating plan.  To find whole grains, look for the word "whole" as the first word in the ingredient list. Shopping   Buy products labeled as "low-sodium" or "no salt added."  Buy fresh foods. Avoid canned foods and premade or frozen meals. Cooking   Avoid adding salt when cooking. Use salt-free seasonings or herbs instead of table salt or sea salt. Check with your health care provider or pharmacist before using salt substitutes.  Do not fry foods. Cook foods using healthy methods such as baking, boiling, grilling, and broiling instead.  Cook with heart-healthy oils, such as olive, canola, soybean, or sunflower oil. Meal planning    Eat a balanced diet that includes:  5 or more servings of fruits and vegetables each day. At each meal, try to fill half of your plate with fruits and vegetables.  Up to 6-8 servings of whole grains each day.  Less than 6 oz of lean meat, poultry, or fish each day. A 3-oz serving of meat is about the same size as a deck of cards. One egg equals 1 oz.  2 servings of low-fat dairy each day.  A  serving of nuts, seeds, or beans 5 times each week.  Heart-healthy fats. Healthy fats called Omega-3 fatty acids are found in foods such as flaxseeds and coldwater fish, like sardines, salmon, and mackerel.  Limit how much you eat of the following:  Canned or prepackaged foods.  Food that is high in trans fat, such as fried foods.  Food that is high in saturated fat, such as fatty meat.  Sweets, desserts, sugary drinks, and other foods with added sugar.  Full-fat dairy products.  Do not salt foods before eating.  Try to eat at least 2 vegetarian meals each week.  Eat more home-cooked food and less  restaurant, buffet, and fast food.  When eating at a restaurant, ask that your food be prepared with less salt or no salt, if possible. What foods are recommended? The items listed may not be a complete list. Talk with your dietitian about what dietary choices are best for you. Grains  Whole-grain or whole-wheat bread. Whole-grain or whole-wheat pasta. Brown rice. [place]. Bulgur. Whole-grain and low-sodium cereals. Pita bread. Low-fat, low-sodium crackers. Whole-wheat flour tortillas. Vegetables  Fresh or frozen vegetables (raw, steamed, roasted, or grilled). Low-sodium or reduced-sodium tomato and vegetable juice. Low-sodium or reduced-sodium tomato sauce and tomato paste. Low-sodium or reduced-sodium canned vegetables. Fruits  All fresh, dried, or frozen fruit. Canned fruit in natural juice (without added sugar). Meat and other protein foods  Skinless chicken or [place]. Ground chicken or [place]. Pork with fat trimmed off. Fish and seafood. Egg whites. Dried beans, peas, or lentils. Unsalted nuts, nut butters, and seeds. Unsalted canned beans. Lean cuts of beef with fat trimmed off. Low-sodium, lean deli meat. Dairy  Low-fat (1%) or fat-free (skim) milk. Fat-free, low-fat, or reduced-fat cheeses. Nonfat, low-sodium ricotta or cottage cheese. Low-fat or nonfat yogurt. Low-fat, low-sodium cheese. Fats and oils  Soft margarine without trans fats. Vegetable oil. Low-fat, reduced-fat, or light mayonnaise and salad dressings (reduced-sodium). Canola, safflower, olive, soybean, and sunflower oils. Avocado. Seasoning and other foods  Herbs. Spices. Seasoning mixes without salt. Unsalted popcorn and pretzels. Fat-free sweets. What foods are not recommended? The items listed may not be a complete list. Talk with your dietitian about what dietary choices are best for you. Grains  Baked goods made with fat, such as croissants, muffins, or some breads. Dry pasta or rice meal packs. Vegetables    Creamed or fried vegetables. Vegetables in a cheese sauce. Regular canned vegetables (not low-sodium or reduced-sodium). Regular canned tomato sauce and paste (not low-sodium or reduced-sodium). Regular tomato and vegetable juice (not low-sodium or reduced-sodium). Angie Fava. Olives. Fruits  Canned fruit in a light or heavy syrup. Fried fruit. Fruit in cream or butter sauce. Meat and other protein foods  Fatty cuts of meat. Ribs. Fried meat. Berniece Salines. Sausage. Bologna and other processed lunch meats. Salami. Fatback. Hotdogs. Bratwurst. Salted nuts and seeds. Canned beans with added salt. Canned or smoked fish. Whole eggs or egg yolks. Chicken or [place] with skin. Dairy  Whole or 2% milk, cream, and half-and-half. Whole or full-fat cream cheese. Whole-fat or sweetened yogurt. Full-fat cheese. Nondairy creamers. Whipped toppings. Processed cheese and cheese spreads. Fats and oils  Butter. Stick margarine. Lard. Shortening. Ghee. Bacon fat. Tropical oils, such as coconut, palm kernel, or palm oil. Seasoning and other foods  Salted popcorn and pretzels. Onion salt, garlic salt, seasoned salt, table salt, and sea salt. Worcestershire sauce. Tartar sauce. Barbecue sauce. Teriyaki sauce. Soy sauce, including reduced-sodium. Steak sauce. Canned and packaged gravies.  Fish sauce. Oyster sauce. Cocktail sauce. Horseradish that you find on the shelf. Ketchup. Mustard. Meat flavorings and tenderizers. Bouillon cubes. Hot sauce and Tabasco sauce. Premade or packaged marinades. Premade or packaged taco seasonings. Relishes. Regular salad dressings. Where to find more information:  National Heart, Lung, and [place]: https://wilson-eaton.com/  American Heart Association: www.heart.org Summary  The DASH eating plan is a healthy eating plan that has been shown to reduce high blood pressure (hypertension). It may also reduce your risk for type 2 diabetes, heart disease, and stroke.  With the DASH eating plan, you  should limit salt (sodium) intake to 2,300 mg a day. If you have hypertension, you may need to reduce your sodium intake to 1,500 mg a day.  When on the DASH eating plan, aim to eat more fresh fruits and vegetables, whole grains, lean proteins, low-fat dairy, and heart-healthy fats.  Work with your health care provider or diet and nutrition specialist (dietitian) to adjust your eating plan to your individual calorie needs. This information is not intended to replace advice given to you by your health care provider. Make sure you discuss any questions you have with your health care provider. Document Released: 10/10/2011 Document Revised: 10/14/2016 Document Reviewed: 10/14/2016 Elsevier Interactive Patient Education  2017 [employer].

## 2017-02-11 NOTE — Telephone Encounter (Signed)
Patient has appointment 02/14/17 with [facility]. Says he needs labs done prior to appointment but cannot get in touch with his PCP at [facility]. Needs to know what to do.

## 2017-02-12 ENCOUNTER — Encounter: Payer: PRIVATE HEALTH INSURANCE | Admitting: Surgery

## 2017-02-12 ENCOUNTER — Ambulatory Visit (INDEPENDENT_AMBULATORY_CARE_PROVIDER_SITE_OTHER): Payer: PRIVATE HEALTH INSURANCE | Admitting: Surgery

## 2017-02-12 ENCOUNTER — Encounter: Payer: Self-pay | Admitting: Surgery

## 2017-02-12 VITALS — BP 166/98 | HR 65 | Temp 97.7°F | Ht 78.0 in | Wt 251.0 lb

## 2017-02-12 DIAGNOSIS — Z09 Encounter for follow-up examination after completed treatment for conditions other than malignant neoplasm: Secondary | ICD-10-CM

## 2017-02-12 LAB — BASIC METABOLIC PANEL
BUN/Creatinine Ratio: 17 (ref 9–20)
BUN: 19 mg/dL (ref 6–24)
CALCIUM: 10.2 mg/dL (ref 8.7–10.2)
CO2: 31 mmol/L — AB (ref 18–29)
Chloride: 91 mmol/L — ABNORMAL LOW (ref 96–106)
Creatinine, Ser: 1.14 mg/dL (ref 0.76–1.27)
GFR calc Af Amer: 89 mL/min/{1.73_m2} (ref >59)
GFR calc non Af Amer: 77 mL/min/{1.73_m2} (ref >59)
Glucose: 77 mg/dL (ref 65–99)
Potassium: 4.2 mmol/L (ref 3.5–5.2)
SODIUM: 138 mmol/L (ref 134–144)

## 2017-02-12 NOTE — Progress Notes (Signed)
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 Eating well, no airway issues, normal voice. No other complaints  PE NAD, walks into the office Incision c/d/I, there is a seroma, no airway compromise. using U/S and sterile technique I aspirated 32 cc serous fluid.  No infection.  A/P doing well, seroma aspirated RTC 10 days, d/w him the possibility of placing a drain if his seroma continues to accumulate. He wishes to wait and see me back in 10 days or so. No need for surgical intervention at this time

## 2017-02-12 NOTE — Patient Instructions (Signed)
We would like to see you back in the office on 02/24/17 at 3:00 pm. Please call if you have questions or concerns.

## 2017-02-24 ENCOUNTER — Encounter: Payer: Self-pay | Admitting: Surgery

## 2017-02-24 ENCOUNTER — Ambulatory Visit (INDEPENDENT_AMBULATORY_CARE_PROVIDER_SITE_OTHER): Payer: PRIVATE HEALTH INSURANCE | Admitting: Surgery

## 2017-02-24 VITALS — BP 165/106 | HR 66 | Temp 98.2°F | Ht 72.0 in | Wt 249.0 lb

## 2017-02-24 DIAGNOSIS — Z09 Encounter for follow-up examination after completed treatment for conditions other than malignant neoplasm: Secondary | ICD-10-CM

## 2017-02-24 NOTE — Progress Notes (Signed)
s/p total thyroidectomy 1 month ago Some swelling and some mild dysphagia for solids AVSS Hypertensive today  PE NAD Neck: incisions c/d/I, small area with mild edema. U/S show some postoperative changes and maybe 2 small collections of less than 2 cc each. No drainable collections  A/P Doing well Postoperative inflammatory changes should resolve with time He asked me for a narcotic prescription but at this time he is better served w tylenol and/or ibuprofen F/U 2 weeks w TSH

## 2017-02-24 NOTE — Patient Instructions (Addendum)
We will see you in 2 weeks. Make sure you have your labs drawn prior to your visit next time. Arrive 45 minutes-1 hour prior to your appointment to have the labs drawn and go to the [facility]. Then come to the Office after these have been drawn.  Use Ibuprofen and Tylenol for pain and Inflammation. These can be used simultaneously if needed.  Please follow-up with your Primary Care Physician on Wednesday as planned to discuss your thyroid and your elevated Blood Pressure.  Please use the diet below for the next 1-2 weeks to decrease inflammation.  You may also use Ice to this area 3-4 times daily for 15-20 minutes each time. Make sure that you have a barrier between the ice pack and your skin.  Soft-Food Meal Plan A soft-food meal plan includes foods that are safe and easy to swallow. This meal plan typically is used:  If you are having trouble chewing or swallowing foods.  As a transition meal plan after only having had liquid meals for a long period. What do I need to know about the soft-food meal plan? A soft-food meal plan includes tender foods that are soft and easy to chew and swallow. In most cases, bite-sized pieces of food are easier to swallow. A bite-sized piece is about  inch or smaller. Foods in this plan do not need to be ground or pureed. Foods that are very hard, crunchy, or sticky should be avoided. Also, breads, cereals, yogurts, and desserts with nuts, seeds, or fruits should be avoided. What foods can I eat? Grains  Rice and wild rice. Moist bread, dressing, pasta, and noodles. Well-moistened dry or cooked cereals, such as farina (cooked wheat cereal), oatmeal, or grits. Biscuits, breads, muffins, pancakes, and waffles that have been well moistened. Vegetables  Shredded lettuce. Cooked, tender vegetables, including potatoes without skins. Vegetable juices. Broths or creamed soups made with vegetables that are not stringy or chewy. Strained tomatoes (without  seeds). Fruits  Canned or well-cooked fruits. Soft (ripe), peeled fresh fruits, such as peaches, nectarines, kiwi, cantaloupe, honeydew melon, and watermelon (without seeds). Soft berries with small seeds, such as strawberries. Fruit juices (without pulp). Meats and Other Protein Sources  Moist, tender, lean beef. Mutton. Lamb. Veal. Chicken. [place]. Liver. Ham. Fish without bones. Eggs. Dairy  Milk, milk drinks, and cream. Plain cream cheese and cottage cheese. Plain yogurt. Sweets/Desserts  Flavored gelatin desserts. Custard. Plain ice cream, frozen yogurt, sherbet, milk shakes, and malts. Plain cakes and cookies. Plain hard candy. Other  Butter, margarine (without trans fat), and cooking oils. Mayonnaise. Cream sauces. Mild spices, salt, and sugar. Syrup, molasses, honey, and jelly. The items listed above may not be a complete list of recommended foods or beverages. Contact your dietitian for more options.  What foods are not recommended? Grains  Dry bread, toast, crackers that have not been moistened. Coarse or dry cereals, such as bran, granola, and shredded wheat. Tough or chewy crusty breads, such as [place] bread or baguettes. Vegetables  Corn. Raw vegetables except shredded lettuce. Cooked vegetables that are tough or stringy. Tough, crisp, fried potatoes and potato skins. Fruits  Fresh fruits with skins or seeds or both, such as apples, pears, or grapes. Stringy, high-pulp fruits, such as papaya, pineapple, coconut, or mango. Fruit leather, fruit roll-ups, and all dried fruits. Meats and Other Protein Sources  Sausages and hot dogs. Meats with gristle. Fish with bones. Nuts, seeds, and chunky peanut or other nut butters. Sweets/Desserts  Cakes or  cookies that are very dry or chewy. The items listed above may not be a complete list of foods and beverages to avoid. Contact your dietitian for more information.  This information is not intended to replace advice given to you by your  health care provider. Make sure you discuss any questions you have with your health care provider. Document Released: 01/28/2008 Document Revised: 03/28/2016 Document Reviewed: 09/17/2013 Elsevier Interactive Patient Education  2017 [employer].

## 2017-02-26 ENCOUNTER — Encounter: Payer: Self-pay | Admitting: Internal Medicine

## 2017-02-26 ENCOUNTER — Ambulatory Visit: Payer: PRIVATE HEALTH INSURANCE | Admitting: Internal Medicine

## 2017-02-26 VITALS — BP 132/87 | HR 68 | Temp 98.2°F | Wt 248.3 lb

## 2017-02-26 DIAGNOSIS — E079 Disorder of thyroid, unspecified: Secondary | ICD-10-CM

## 2017-02-26 DIAGNOSIS — E119 Type 2 diabetes mellitus without complications: Secondary | ICD-10-CM

## 2017-02-26 LAB — GLUCOSE, POCT (MANUAL RESULT ENTRY): POC Glucose: 137 mg/dl — AB (ref 70–99)

## 2017-02-26 MED ORDER — OMEPRAZOLE 20 MG PO CPDR: 20.0000 mg | DELAYED_RELEASE_CAPSULE | Freq: Every day | ORAL | 1 refills | Status: DC

## 2017-02-26 NOTE — Progress Notes (Signed)
   Subjective:    Patient ID: Richard Riley., male    DOB: 01-26-1971, 46 y.o.   MRN: [id number]  HPI   Richard Riley had surgery to have his thyroid removed which was noncancerous. Patients states that they had to drain fluid from surgical site two times.  Blood pressure is improved.  Dr. Mable Fill did a manual blood pressure check with 138/90 which is still considered borderline.  Patient states that he has twitching all over his body.  He has cramps primarily in hands and feet. He also complains of indigestion.    Patient Active Problem List   Diagnosis Date Noted  . Medication monitoring encounter 02/11/2017  . Tobacco abuse 02/11/2017  . Essential hypertension, benign 02/11/2017  . S/P thyroidectomy 01/23/2017  . Type 2 diabetes mellitus not at goal [facility]) 10/22/2016   Allergies as of 02/26/2017   No Known Allergies     Medication List       Accurate as of 02/26/17 10:25 AM. Always use your most recent med list.          hydrochlorothiazide 25 MG tablet Commonly known as:  HYDRODIURIL Take 1 tablet (25 mg total) by mouth daily.   Insulin Glargine 100 UNIT/ML Solostar Pen Commonly known as:  LANTUS SOLOSTAR Start with 20 units each evening. Increase dose by 2 units every three days if morning blood sugar over 130, as directed.   levothyroxine 125 MCG tablet Commonly known as:  SYNTHROID Take 1 tablet (125 mcg total) by mouth daily before breakfast.   lisinopril 20 MG tablet Commonly known as:  PRINIVIL,ZESTRIL Take 1.5 tablets (30 mg total) by mouth daily.   metFORMIN 1000 MG tablet Commonly known as:  GLUCOPHAGE TAKE ONE TABLET BY MOUTH 2 TIMES A DAY WITH A MEAL.   metoprolol 50 MG tablet Commonly known as:  LOPRESSOR Take 1 tablet (50 mg total) by mouth 2 (two) times daily.   Pen Needles 32G X 5 MM Misc 1 each by Does not apply route daily.   sitaGLIPtin 100 MG tablet Commonly known as:  JANUVIA Take 1 tablet (100 mg total) by mouth daily.        Review  of Systems  BP 132/87   Pulse 68   Temp 98.2 F (36.8 C) (Oral)   Wt 248 lb 4.8 oz (112.6 kg)   BMI 33.68 kg/m       Objective:   Physical Exam  Constitutional: He is oriented to person, place, and time.  Cardiovascular: Normal rate, regular rhythm and normal heart sounds.   Pulmonary/Chest: Breath sounds normal.  Neurological: He is alert and oriented to person, place, and time.          Assessment & Plan:  Cramps and tingling may be coming from changes in medications.  He will start Prilosec for indigestion.  Patient will be seeing the endocrinologist in May with MetC, CBC and TSH before visit.  He will return to see Dr. Mable Fill in 3 months with MetC, CBC, Lipids and TSH prior to appointment.

## 2017-02-26 NOTE — Patient Instructions (Signed)
Patient to get labs prior to endocrinology visit (May 10)  MetC, CBC and TSH Return to see Dr. Mable Fill in 3 months with labs prior [facility], CBC, Lipids and TSH.

## 2017-02-27 NOTE — Telephone Encounter (Signed)
Patient went to his appointment at the [facility]. However, they discussed his Hypertension but not his thyroid. We called patient to let him know that we will refill his medication for one month but that he needed to get refilled with the endocrinologist (03/18/2017).   [facility] coming to his appointment with Dr. Dahlia Byes. Lab was ordered.

## 2017-02-28 ENCOUNTER — Other Ambulatory Visit: Payer: Self-pay | Admitting: Internal Medicine

## 2017-03-05 ENCOUNTER — Encounter: Payer: PRIVATE HEALTH INSURANCE | Admitting: Surgery

## 2017-03-11 ENCOUNTER — Telehealth: Payer: Self-pay | Admitting: Nurse Practitioner

## 2017-03-11 NOTE — Telephone Encounter (Signed)
Ineligible for Medicaid and doesn't know why

## 2017-03-13 ENCOUNTER — Other Ambulatory Visit: Payer: PRIVATE HEALTH INSURANCE

## 2017-03-13 DIAGNOSIS — E079 Disorder of thyroid, unspecified: Secondary | ICD-10-CM

## 2017-03-13 DIAGNOSIS — Z09 Encounter for follow-up examination after completed treatment for conditions other than malignant neoplasm: Secondary | ICD-10-CM

## 2017-03-14 LAB — CBC WITH DIFFERENTIAL
BASOS ABS: 0.1 10*3/uL (ref 0.0–0.2)
Basos: 1 %
EOS (ABSOLUTE): 0.4 10*3/uL (ref 0.0–0.4)
Eos: 3 %
Hematocrit: 44.7 % (ref 37.5–51.0)
Hemoglobin: 15.7 g/dL (ref 13.0–17.7)
IMMATURE GRANULOCYTES: 0 %
Immature Grans (Abs): 0 10*3/uL (ref 0.0–0.1)
Lymphocytes Absolute: 3.9 10*3/uL — ABNORMAL HIGH (ref 0.7–3.1)
Lymphs: 31 %
MCH: 30.7 pg (ref 26.6–33.0)
MCHC: 35.1 g/dL (ref 31.5–35.7)
MCV: 87 fL (ref 79–97)
MONOS ABS: 0.7 10*3/uL (ref 0.1–0.9)
Monocytes: 6 %
NEUTROS ABS: 7.5 10*3/uL — AB (ref 1.4–7.0)
NEUTROS PCT: 59 %
RBC: 5.12 x10E6/uL (ref 4.14–5.80)
RDW: 14.8 % (ref 12.3–15.4)
WBC: 12.5 10*3/uL — ABNORMAL HIGH (ref 3.4–10.8)

## 2017-03-14 LAB — COMPREHENSIVE METABOLIC PANEL
A/G RATIO: 1.7 (ref 1.2–2.2)
ALT: 32 IU/L (ref 0–44)
AST: 32 IU/L (ref 0–40)
Albumin: 4.7 g/dL (ref 3.5–5.5)
Alkaline Phosphatase: 83 IU/L (ref 39–117)
BILIRUBIN TOTAL: 0.7 mg/dL (ref 0.0–1.2)
BUN/Creatinine Ratio: 23 — ABNORMAL HIGH (ref 9–20)
BUN: 27 mg/dL — ABNORMAL HIGH (ref 6–24)
CALCIUM: 9.6 mg/dL (ref 8.7–10.2)
CHLORIDE: 97 mmol/L (ref 96–106)
CO2: 24 mmol/L (ref 18–29)
Creatinine, Ser: 1.18 mg/dL (ref 0.76–1.27)
GFR calc non Af Amer: 74 mL/min/{1.73_m2} (ref >59)
GFR, EST AFRICAN AMERICAN: 86 mL/min/{1.73_m2} (ref >59)
GLOBULIN, TOTAL: 2.8 g/dL (ref 1.5–4.5)
Glucose: 123 mg/dL — ABNORMAL HIGH (ref 65–99)
Potassium: 4.4 mmol/L (ref 3.5–5.2)
SODIUM: 139 mmol/L (ref 134–144)
TOTAL PROTEIN: 7.5 g/dL (ref 6.0–8.5)

## 2017-03-14 LAB — TSH: TSH: 18.47 u[IU]/mL — AB (ref 0.450–4.500)

## 2017-03-18 ENCOUNTER — Ambulatory Visit: Payer: PRIVATE HEALTH INSURANCE | Admitting: Internal Medicine

## 2017-03-18 MED ORDER — LEVOTHYROXINE SODIUM 150 MCG PO TABS: 150.0000 ug | ORAL_TABLET | Freq: Every day | ORAL | 3 refills | Status: DC

## 2017-03-18 NOTE — Progress Notes (Signed)
Assessment:   1. S/P thyroidectomy: TSH on 03/13/17 was 18.5 and has been increasing.   2. Type 2 Diabetes:  Most recent HbA1c from today was 6.9%.   3. Hypertension: well controlled on 30 mg Lisopril. BP was 118/77 today.      Plan:    1.  Rx changes: Increase Levothyroxine from 125 Mcg dose to 150 mcg.   3. Refill Lisonpril today. Continue 30 mg daily.   4. Continue taking 20 U of Lantus, Januvia, and metformin. Test your blood sugar next time you feel light headed, shaky, or dizzy.  If it is <80 mg/dL, eat sugar and carbohydrates.   2.  Richard Riley. attended the shared medical appointment on 03/18/2017. Topics that were discussed tonight include diet, exercise, taking medications, blood glucose pattern recognition, meaning of laboratory results.   3. Follow up: I recommend patient follow-up with me at 3 months.     Subjective:    Richard Riley. is a 46 y.o. male who is seen in follow up forType 2 diabetes, hypothyroidism following thyroidectomy, and hypertension. He had his thyroid taken out a four weeks ago. He had a nodule that was removed and found to be non-cancerous. Since then he reports tingling and numbness in his arm/hands and feet. The tingling happens in his arms periodically. Tingling in his feet all the time.   Eye exam current (within one year): Has one due in July.   Current regimen: 20 Units of Lantus in the evening. 100 mg januvia. 1000 mg metformin BID. No changes recently.   Patient's glucometer not downloaded and reviewed. He tests his blood sugar 3 times a week and reports readings between 90 and 110. Highest readings are 160s.   Reports getting light-headed, dizzy, or shaky 1 or more times per week, with no consistent patterning. He does not test his blood sugar and does not consume any carbohydrates, and it goes away within 1-10 minutes.   Denies chest pain, shortness of breath, edema, foot lesions or ulcers. He endorses edema.   Denies severe  hypoglycemia or admission to hospital for DKA.  Current exercise: None.   The patient's history was reviewed and updated as appropriate.  No Known Allergies   Current Outpatient Prescriptions:  .  hydrochlorothiazide (HYDRODIURIL) 25 MG tablet, TAKE ONE TABLET BY MOUTH EVERY DAY, Disp: 90 tablet, Rfl: 0 .  Insulin Glargine (LANTUS SOLOSTAR) 100 UNIT/ML Solostar Pen, Start with 20 units each evening. Increase dose by 2 units every three days if morning blood sugar over 130, as directed. (Patient taking differently: 24 Units daily at 10 pm. Start with 20 units each evening. Increase dose by 2 units every three days if morning blood sugar over 130, as directed.), Disp: 15 mL, Rfl: 11 .  Insulin Pen Needle (PEN NEEDLES) 32G X 5 MM MISC, 1 each by Does not apply route daily., Disp: 50 each, Rfl: 5 .  levothyroxine (SYNTHROID) 125 MCG tablet, Take 1 tablet (125 mcg total) by mouth daily before breakfast., Disp: 30 tablet, Rfl: 0 .  lisinopril (PRINIVIL,ZESTRIL) 20 MG tablet, Take 1.5 tablets (30 mg total) by mouth daily., Disp: 45 tablet, Rfl: 1 .  metFORMIN (GLUCOPHAGE) 1000 MG tablet, TAKE ONE TABLET BY MOUTH 2 TIMES A DAY WITH A MEAL. (Patient taking differently: 1,000 mg 2 (two) times daily with a meal. TAKE ONE TABLET BY MOUTH 2 TIMES A DAY WITH A MEAL.), Disp: 120 tablet, Rfl: 1 .  metoprolol (LOPRESSOR) 50 MG tablet, Take 1  tablet (50 mg total) by mouth 2 (two) times daily., Disp: 60 tablet, Rfl: 2 .  omeprazole (PRILOSEC) 20 MG capsule, Take 1 capsule (20 mg total) by mouth daily. At bedtime., Disp: 90 capsule, Rfl: 1 .  sitaGLIPtin (JANUVIA) 100 MG tablet, Take 1 tablet (100 mg total) by mouth daily., Disp: 90 tablet, Rfl: 1  Social History   Social History  . Marital status: Single    Spouse name: N/A  . Number of children: N/A  . Years of education: N/A   Social History Main Topics  . Smoking status: Former Smoker    Packs/day: 0.10    Types: Cigarettes    Quit date: 09/10/2015   . Smokeless tobacco: Current User    Types: Snuff  . Alcohol use 1.2 oz/week    2 Cans of beer per week     Comment: rarely  . Drug use: No  . Sexual activity: Yes    Birth control/ protection: Condom   Other Topics Concern  . Not on file   Social History Narrative  . No narrative on file    Family History  Problem Relation Age of Onset  . Hypertension Mother   . Hypothyroidism Mother   . Diabetes Father     Review of Systems A 12 point review of systems was negative except for pertinent items noted in the HPI.   Objective:     Wt Readings from Last 3 Encounters:  02/26/17 248 lb 4.8 oz (112.6 kg)  02/24/17 249 lb (112.9 kg)  02/12/17 251 lb (113.9 kg)   There were no vitals taken for this visit.  General appearance:  alert and appears stated age, in no distress      Eyes:  conjunctivae/corneas clear. EOM's intact. Sclera anicteric. Negative lid lag or proptosis     Neck: no adenopathy, supple, symmetrical, trachea midline  Thyroid:  Mobile, normal size, no palpable nodule  Lung: clear to auscultation bilaterally  Heart:  regular rate and rhythm, S1, S2 normal, no murmur, click, rub or gallop  Abdomen:  bowel sounds normal; negative bruits  Extremities: extremities normal, atraumatic, no cyanosis or edema     Pulses: DP & PT 2+ and symmetric.  Neuro: normal without focal findings, mental status, speech normal, alert and oriented x3, reflexes normal and symmetric and gait normal.   Feet: negative lesions or ulcers, 10 gram monofilament intact bilateral plantar surface     Lab Review No components found for: A1C Glucose (mg/dL)  Date Value  03/13/2017 123 (H)  02/11/2017 77   Glucose, Bld (mg/dL)  Date Value  02/06/2017 199 (H)  01/24/2017 171 (H)  01/23/2017 226 (H)   CO2 (mmol/L)  Date Value  03/13/2017 24  02/11/2017 31 (H)  02/06/2017 31   BUN (mg/dL)  Date Value  03/13/2017 27 (H)  02/11/2017 19  02/06/2017 25 (H)  01/24/2017 22 (H)   01/23/2017 25 (H)  01/14/2017 19   Creatinine, Ser (mg/dL)  Date Value  03/13/2017 1.18  02/11/2017 1.14  02/06/2017 1.17   No components found for: LDL,  LDLCALC,  LDLDIRECT Lab Results  Component Value Date   NA 139 03/13/2017   K 4.4 03/13/2017   CL 97 03/13/2017   CO2 24 03/13/2017   BUN 27 (H) 03/13/2017   CREATININE 1.18 03/13/2017   GFRAA 86 03/13/2017   GFRNONAA 74 03/13/2017   CALCIUM 9.6 03/13/2017   ALBUMIN 4.7 03/13/2017     DIABETES MELLITUS RESULTS: Lab Results  Component  Value Date   HGBA1C 6.7 (H) 12/10/2016   HGBA1C 7.4 (H) 11/12/2016   HGBA1C 8.7 (H) 10/10/2016   Lab Results  Component Value Date   CREATININE 1.18 03/13/2017   Lab Results  Component Value Date   CHOL 150 04/16/2016   No results found for: LDLCALC No components found for: CHOLLLDLDIRECT No components found for: MICROALB/CR Lab Results  Component Value Date   GFRAA 86 03/13/2017   GFRNONAA 74 03/13/2017

## 2017-03-19 ENCOUNTER — Encounter: Payer: Self-pay | Admitting: Surgery

## 2017-03-19 ENCOUNTER — Ambulatory Visit (INDEPENDENT_AMBULATORY_CARE_PROVIDER_SITE_OTHER): Payer: PRIVATE HEALTH INSURANCE | Admitting: Surgery

## 2017-03-19 VITALS — BP 117/79 | HR 73 | Temp 98.0°F | Ht 78.0 in | Wt 250.6 lb

## 2017-03-19 DIAGNOSIS — Z09 Encounter for follow-up examination after completed treatment for conditions other than malignant neoplasm: Secondary | ICD-10-CM

## 2017-03-19 NOTE — Progress Notes (Signed)
S/p thyroidectomy for multinodular goiter and thyroid nodule Doing well No complaints Ca nml Being followed by endocrine, TSH up and  synthroid increased  PE NAD Incisions healed, no infection or expanding hematoma  A/P doing well No surgical issues RTC prn

## 2017-03-19 NOTE — Patient Instructions (Signed)
Please call our office if you have questions or concerns.   

## 2017-05-20 ENCOUNTER — Other Ambulatory Visit: Payer: Self-pay | Admitting: Internal Medicine

## 2017-05-20 DIAGNOSIS — I10 Essential (primary) hypertension: Secondary | ICD-10-CM

## 2017-05-20 DIAGNOSIS — E0590 Thyrotoxicosis, unspecified without thyrotoxic crisis or storm: Secondary | ICD-10-CM

## 2017-05-20 DIAGNOSIS — E119 Type 2 diabetes mellitus without complications: Secondary | ICD-10-CM

## 2017-05-21 ENCOUNTER — Other Ambulatory Visit: Payer: PRIVATE HEALTH INSURANCE

## 2017-05-21 DIAGNOSIS — I10 Essential (primary) hypertension: Secondary | ICD-10-CM

## 2017-05-21 DIAGNOSIS — Z9889 Other specified postprocedural states: Secondary | ICD-10-CM

## 2017-05-21 DIAGNOSIS — E890 Postprocedural hypothyroidism: Secondary | ICD-10-CM

## 2017-05-21 DIAGNOSIS — E119 Type 2 diabetes mellitus without complications: Secondary | ICD-10-CM

## 2017-05-22 ENCOUNTER — Telehealth: Payer: Self-pay | Admitting: Pharmacist

## 2017-05-22 ENCOUNTER — Other Ambulatory Visit: Payer: Self-pay | Admitting: Urology

## 2017-05-22 ENCOUNTER — Ambulatory Visit: Payer: Self-pay | Admitting: Ophthalmology

## 2017-05-22 LAB — CBC WITH DIFFERENTIAL/PLATELET
BASOS: 1 %
Basophils Absolute: 0.1 10*3/uL (ref 0.0–0.2)
EOS (ABSOLUTE): 0.3 10*3/uL (ref 0.0–0.4)
EOS: 3 %
HEMATOCRIT: 47.5 % (ref 37.5–51.0)
HEMOGLOBIN: 16 g/dL (ref 13.0–17.7)
Immature Grans (Abs): 0 10*3/uL (ref 0.0–0.1)
Immature Granulocytes: 0 %
LYMPHS ABS: 2.7 10*3/uL (ref 0.7–3.1)
Lymphs: 25 %
MCH: 32 pg (ref 26.6–33.0)
MCHC: 33.7 g/dL (ref 31.5–35.7)
MCV: 95 fL (ref 79–97)
MONOS ABS: 0.7 10*3/uL (ref 0.1–0.9)
Monocytes: 6 %
NEUTROS ABS: 7.2 10*3/uL — AB (ref 1.4–7.0)
Neutrophils: 65 %
Platelets: 137 10*3/uL — ABNORMAL LOW (ref 150–379)
RBC: 5 x10E6/uL (ref 4.14–5.80)
RDW: 13.5 % (ref 12.3–15.4)
WBC: 11 10*3/uL — AB (ref 3.4–10.8)

## 2017-05-22 LAB — COMPREHENSIVE METABOLIC PANEL
ALBUMIN: 4.7 g/dL (ref 3.5–5.5)
ALT: 28 IU/L (ref 0–44)
AST: 21 IU/L (ref 0–40)
Albumin/Globulin Ratio: 1.8 (ref 1.2–2.2)
Alkaline Phosphatase: 77 IU/L (ref 39–117)
BUN / CREAT RATIO: 25 — AB (ref 9–20)
BUN: 26 mg/dL — ABNORMAL HIGH (ref 6–24)
Bilirubin Total: 1.1 mg/dL (ref 0.0–1.2)
CO2: 23 mmol/L (ref 20–29)
CREATININE: 1.04 mg/dL (ref 0.76–1.27)
Calcium: 9.4 mg/dL (ref 8.7–10.2)
Chloride: 98 mmol/L (ref 96–106)
GFR calc non Af Amer: 86 mL/min/{1.73_m2} (ref >59)
GFR, EST AFRICAN AMERICAN: 100 mL/min/{1.73_m2} (ref >59)
GLOBULIN, TOTAL: 2.6 g/dL (ref 1.5–4.5)
Glucose: 166 mg/dL — ABNORMAL HIGH (ref 65–99)
Potassium: 4.4 mmol/L (ref 3.5–5.2)
SODIUM: 139 mmol/L (ref 134–144)
TOTAL PROTEIN: 7.3 g/dL (ref 6.0–8.5)

## 2017-05-22 LAB — LIPID PANEL
CHOL/HDL RATIO: 5.1 ratio — AB (ref 0.0–5.0)
Cholesterol, Total: 188 mg/dL (ref 100–199)
HDL: 37 mg/dL — ABNORMAL LOW (ref >39)
LDL CALC: 99 mg/dL (ref 0–99)
Triglycerides: 260 mg/dL — ABNORMAL HIGH (ref 0–149)
VLDL Cholesterol Cal: 52 mg/dL — ABNORMAL HIGH (ref 5–40)

## 2017-05-22 LAB — TSH: TSH: 19.35 u[IU]/mL — AB (ref 0.450–4.500)

## 2017-05-22 NOTE — Telephone Encounter (Signed)
05/22/17 Januvia 100mg  Called [employer] for last refill.Richard Riley

## 2017-05-23 ENCOUNTER — Telehealth: Payer: Self-pay

## 2017-05-23 NOTE — Telephone Encounter (Signed)
SW PT about his TSH result. Patient stated he was taking Synthroid 150 mcg daily he had only missed 1 dose in the last month.  Larene Beach asked him to take on an empty stomach and wait at least 1 hour before taking his other medications to see if that helped his TSH value.

## 2017-05-28 ENCOUNTER — Ambulatory Visit: Payer: PRIVATE HEALTH INSURANCE | Admitting: Internal Medicine

## 2017-05-28 VITALS — BP 168/100 | HR 74 | Temp 98.1°F | Wt 257.0 lb

## 2017-05-28 DIAGNOSIS — I10 Essential (primary) hypertension: Secondary | ICD-10-CM

## 2017-05-28 DIAGNOSIS — E119 Type 2 diabetes mellitus without complications: Secondary | ICD-10-CM

## 2017-05-28 DIAGNOSIS — E0590 Thyrotoxicosis, unspecified without thyrotoxic crisis or storm: Secondary | ICD-10-CM

## 2017-05-28 LAB — GLUCOSE, POCT (MANUAL RESULT ENTRY): POC Glucose: 183 mg/dl — AB (ref 70–99)

## 2017-05-28 MED ORDER — SULFAMETHOXAZOLE-TRIMETHOPRIM 800-160 MG PO TABS: 1.0000 | ORAL_TABLET | Freq: Two times a day (BID) | ORAL | 0 refills | Status: DC

## 2017-05-28 MED ORDER — INSULIN GLARGINE 100 UNIT/ML SOLOSTAR PEN: PEN_INJECTOR | SUBCUTANEOUS | 11 refills | Status: DC

## 2017-05-28 MED ORDER — PEN NEEDLES 32G X 5 MM MISC: 1.0000 | Freq: Every day | 5 refills | Status: DC

## 2017-05-28 MED ORDER — LISINOPRIL 20 MG PO TABS: 20.0000 mg | ORAL_TABLET | Freq: Every day | ORAL | 3 refills | Status: DC

## 2017-05-28 MED ORDER — SITAGLIPTIN PHOSPHATE 100 MG PO TABS: 100.0000 mg | ORAL_TABLET | Freq: Every day | ORAL | 1 refills | Status: DC

## 2017-05-28 MED ORDER — LEVOTHYROXINE SODIUM 200 MCG PO TABS: 200.0000 ug | ORAL_TABLET | Freq: Every day | ORAL | 3 refills | Status: DC

## 2017-05-28 NOTE — Patient Instructions (Signed)
Patient to return in 4-5 weeks with labs prior to appt.  TSH, T4, MetC, A1C

## 2017-05-28 NOTE — Progress Notes (Signed)
   Subjective:    Patient ID: Richard Riley., male    DOB: 1971/10/19, 46 y.o.   MRN: [id number]  HPI Patient Active Problem List   Diagnosis Date Noted  . Medication monitoring encounter 02/11/2017  . Tobacco abuse 02/11/2017  . Essential hypertension, benign 02/11/2017  . S/P thyroidectomy 01/23/2017  . Type 2 diabetes mellitus not at goal [facility]) 10/22/2016    Patient had breakfast this morning at 7:30am and had peanut butter toast.  Glucose is 183.  Mr. Richard Riley complains of a sinus infection and denies ear pain. Drainage is dark yellow.  Review of Systems   Thyroid medication seems to not be working.  Due to weight gain, it is recommended to increase dosage of levothyroxine.     Objective:   Physical Exam  Constitutional: He is oriented to person, place, and time.  Cardiovascular: Normal rate and regular rhythm.   Pulmonary/Chest: Breath sounds normal.  Neurological: He is alert and oriented to person, place, and time.    BP (!) 168/100 (BP Location: Left Arm)   Pulse 74   Temp 98.1 F (36.7 C)   Wt 257 lb (116.6 kg)   BMI 29.70 kg/m   Allergies as of 05/28/2017   No Known Allergies     Medication List       Accurate as of 05/28/17  9:21 AM. Always use your most recent med list.          hydrochlorothiazide 25 MG tablet Commonly known as:  HYDRODIURIL TAKE ONE TABLET BY MOUTH EVERY DAY   Insulin Glargine 100 UNIT/ML Solostar Pen Commonly known as:  LANTUS SOLOSTAR Start with 20 units each evening. Increase dose by 2 units every three days if morning blood sugar over 130, as directed.   levothyroxine 150 MCG tablet Commonly known as:  SYNTHROID Take 1 tablet (150 mcg total) by mouth daily before breakfast.   lisinopril 20 MG tablet Commonly known as:  PRINIVIL,ZESTRIL TAKE 1 1/2 TABLETS (30MG ) BY MOUTH EVERY DAY   metFORMIN 1000 MG tablet Commonly known as:  GLUCOPHAGE TAKE ONE TABLET BY MOUTH 2 TIMES A DAY WITH A MEAL.   metoprolol tartrate 50 MG  tablet Commonly known as:  LOPRESSOR TAKE ONE TABLET BY MOUTH 2 TIMES A DAY   omeprazole 20 MG capsule Commonly known as:  PRILOSEC Take 1 capsule (20 mg total) by mouth daily. At bedtime.   Pen Needles 32G X 5 MM Misc 1 each by Does not apply route daily.   sitaGLIPtin 100 MG tablet Commonly known as:  JANUVIA Take 1 tablet (100 mg total) by mouth daily.            Assessment & Plan:  Patient is to increase levothyroxine to 200mg  daily, Lisinopril to 40mg  daily.  He is to return for TSH, T4, MetC and A1c in 4 to 5 weeks.  Patient was given antibiotic for sinus infection.

## 2017-06-19 ENCOUNTER — Telehealth: Payer: Self-pay

## 2017-06-19 NOTE — Telephone Encounter (Signed)
Placed signed application/script in [facility] folder for pickup. 

## 2017-06-19 NOTE — Telephone Encounter (Signed)
Received PAP application from [facility] for Lantus placed for provider to sign. 

## 2017-06-23 ENCOUNTER — Telehealth: Payer: Self-pay | Admitting: Pharmacist

## 2017-06-23 NOTE — Telephone Encounter (Signed)
06/23/17 Faxed [employer] refill request for Lantus Solostar pen Inject max daily 30 units every evening- Inject 20 units every evening increase by 2 units every three days if AM blood sugar is >130.Richard Riley

## 2017-07-03 ENCOUNTER — Other Ambulatory Visit: Payer: PRIVATE HEALTH INSURANCE

## 2017-07-03 DIAGNOSIS — I10 Essential (primary) hypertension: Secondary | ICD-10-CM

## 2017-07-03 DIAGNOSIS — E119 Type 2 diabetes mellitus without complications: Secondary | ICD-10-CM

## 2017-07-03 DIAGNOSIS — E0590 Thyrotoxicosis, unspecified without thyrotoxic crisis or storm: Secondary | ICD-10-CM

## 2017-07-04 LAB — COMPREHENSIVE METABOLIC PANEL
ALT: 39 IU/L (ref 0–44)
AST: 25 IU/L (ref 0–40)
Albumin/Globulin Ratio: 1.8 (ref 1.2–2.2)
Albumin: 4.9 g/dL (ref 3.5–5.5)
Alkaline Phosphatase: 67 IU/L (ref 39–117)
BUN/Creatinine Ratio: 21 — ABNORMAL HIGH (ref 9–20)
BUN: 26 mg/dL — AB (ref 6–24)
Bilirubin Total: 0.9 mg/dL (ref 0.0–1.2)
CALCIUM: 10.5 mg/dL — AB (ref 8.7–10.2)
CO2: 27 mmol/L (ref 20–29)
CREATININE: 1.22 mg/dL (ref 0.76–1.27)
Chloride: 98 mmol/L (ref 96–106)
GFR, EST AFRICAN AMERICAN: 82 mL/min/{1.73_m2} (ref >59)
GFR, EST NON AFRICAN AMERICAN: 71 mL/min/{1.73_m2} (ref >59)
Globulin, Total: 2.8 g/dL (ref 1.5–4.5)
Glucose: 114 mg/dL — ABNORMAL HIGH (ref 65–99)
Potassium: 4.3 mmol/L (ref 3.5–5.2)
Sodium: 141 mmol/L (ref 134–144)
TOTAL PROTEIN: 7.7 g/dL (ref 6.0–8.5)

## 2017-07-04 LAB — TSH: TSH: 9.46 u[IU]/mL — AB (ref 0.450–4.500)

## 2017-07-04 LAB — HEMOGLOBIN A1C
ESTIMATED AVERAGE GLUCOSE: 143 mg/dL
HEMOGLOBIN A1C: 6.6 % — AB (ref 4.8–5.6)

## 2017-07-04 LAB — T4: T4 TOTAL: 8.2 ug/dL (ref 4.5–12.0)

## 2017-07-09 ENCOUNTER — Encounter: Payer: Self-pay | Admitting: Internal Medicine

## 2017-07-09 ENCOUNTER — Ambulatory Visit: Payer: PRIVATE HEALTH INSURANCE | Admitting: Internal Medicine

## 2017-07-09 VITALS — BP 145/92 | HR 67 | Wt 252.0 lb

## 2017-07-09 DIAGNOSIS — E0590 Thyrotoxicosis, unspecified without thyrotoxic crisis or storm: Secondary | ICD-10-CM

## 2017-07-09 DIAGNOSIS — E039 Hypothyroidism, unspecified: Secondary | ICD-10-CM

## 2017-07-09 DIAGNOSIS — E119 Type 2 diabetes mellitus without complications: Secondary | ICD-10-CM

## 2017-07-09 LAB — POCT GLUCOSE (DEVICE FOR HOME USE): GLUCOSE FASTING, POC: 167 mg/dL — AB (ref 70–99)

## 2017-07-09 NOTE — Progress Notes (Signed)
   Subjective:    Patient ID: Richard Leber., male    DOB: 1970-11-22, 46 y.o.   MRN: [id number]  HPI  Pt here today for follow up on thyroid and labs.   BP (!) 145/92   Pulse 67   Wt 252 lb (114.3 kg)   BMI 29.12 kg/m    Current Outpatient Prescriptions on File Prior to Visit  Medication Sig Dispense Refill  . hydrochlorothiazide (HYDRODIURIL) 25 MG tablet TAKE ONE TABLET BY MOUTH EVERY DAY 90 tablet 0  . Insulin Glargine (LANTUS SOLOSTAR) 100 UNIT/ML Solostar Pen Start with 20 units each evening. Increase dose by 2 units every three days if morning blood sugar over 130, as directed. 15 mL 11  . Insulin Pen Needle (PEN NEEDLES) 32G X 5 MM MISC 1 each by Does not apply route daily. 50 each 5  . levothyroxine (SYNTHROID, LEVOTHROID) 200 MCG tablet Take 1 tablet (200 mcg total) by mouth daily before breakfast. 90 tablet 3  . lisinopril (PRINIVIL,ZESTRIL) 20 MG tablet Take 1 tablet (20 mg total) by mouth daily. Take two tablets dailly (40mg ) 180 tablet 3  . metFORMIN (GLUCOPHAGE) 1000 MG tablet TAKE ONE TABLET BY MOUTH 2 TIMES A DAY WITH A MEAL. 120 tablet 0  . metoprolol tartrate (LOPRESSOR) 50 MG tablet TAKE ONE TABLET BY MOUTH 2 TIMES A DAY 120 tablet 0  . omeprazole (PRILOSEC) 20 MG capsule Take 1 capsule (20 mg total) by mouth daily. At bedtime. 90 capsule 1  . sitaGLIPtin (JANUVIA) 100 MG tablet Take 1 tablet (100 mg total) by mouth daily. 90 tablet 1  . sulfamethoxazole-trimethoprim (BACTRIM DS,SEPTRA DS) 800-160 MG tablet Take 1 tablet by mouth 2 (two) times daily. 14 tablet 0   No current facility-administered medications on file prior to visit.             Review of Systems Patient Active Problem List   Diagnosis Date Noted  . Medication monitoring encounter 02/11/2017  . Tobacco abuse 02/11/2017  . Essential hypertension, benign 02/11/2017  . S/P thyroidectomy 01/23/2017  . Type 2 diabetes mellitus not at goal [facility]) 10/22/2016       Objective:   Physical Exam   Constitutional: He is oriented to person, place, and time.  Cardiovascular: Normal rate, regular rhythm and normal heart sounds.   Pulmonary/Chest: Effort normal and breath sounds normal.  Neurological: He is alert and oriented to person, place, and time.          Assessment & Plan:  Dr Mable Fill wants to keep thyroid and bp meds the same. RTC in one month to recheck blood levels and see dr Mable Fill. Labs tsh, T4, and meet c.

## 2017-07-10 ENCOUNTER — Telehealth: Payer: Self-pay | Admitting: Pharmacist

## 2017-07-10 NOTE — Telephone Encounter (Signed)
07/10/17 Called [employer] for refill, no refills, enrollment ends 07/27/17, patient needs to Re Enroll. Printed application mailing patient his portion to sign & return, taking provider portion to [facility] for signature.[name]

## 2017-07-29 ENCOUNTER — Other Ambulatory Visit: Payer: PRIVATE HEALTH INSURANCE

## 2017-07-29 ENCOUNTER — Other Ambulatory Visit: Payer: Self-pay | Admitting: Internal Medicine

## 2017-07-29 DIAGNOSIS — E039 Hypothyroidism, unspecified: Secondary | ICD-10-CM

## 2017-07-29 DIAGNOSIS — E0590 Thyrotoxicosis, unspecified without thyrotoxic crisis or storm: Secondary | ICD-10-CM

## 2017-07-30 LAB — COMPREHENSIVE METABOLIC PANEL
ALK PHOS: 67 IU/L (ref 39–117)
ALT: 39 IU/L (ref 0–44)
AST: 31 IU/L (ref 0–40)
Albumin/Globulin Ratio: 1.9 (ref 1.2–2.2)
Albumin: 4.9 g/dL (ref 3.5–5.5)
BUN/Creatinine Ratio: 20 (ref 9–20)
BUN: 22 mg/dL (ref 6–24)
Bilirubin Total: 0.6 mg/dL (ref 0.0–1.2)
CO2: 26 mmol/L (ref 20–29)
CREATININE: 1.09 mg/dL (ref 0.76–1.27)
Calcium: 10.2 mg/dL (ref 8.7–10.2)
Chloride: 98 mmol/L (ref 96–106)
GFR calc Af Amer: 94 mL/min/{1.73_m2} (ref >59)
GFR calc non Af Amer: 82 mL/min/{1.73_m2} (ref >59)
GLOBULIN, TOTAL: 2.6 g/dL (ref 1.5–4.5)
GLUCOSE: 106 mg/dL — AB (ref 65–99)
Potassium: 4.4 mmol/L (ref 3.5–5.2)
SODIUM: 142 mmol/L (ref 134–144)
Total Protein: 7.5 g/dL (ref 6.0–8.5)

## 2017-07-30 LAB — T4, FREE: FREE T4: 1.47 ng/dL (ref 0.82–1.77)

## 2017-07-30 LAB — TSH: TSH: 10.12 u[IU]/mL — AB (ref 0.450–4.500)

## 2017-08-06 ENCOUNTER — Encounter: Payer: Self-pay | Admitting: Internal Medicine

## 2017-08-06 ENCOUNTER — Ambulatory Visit: Payer: PRIVATE HEALTH INSURANCE | Admitting: Internal Medicine

## 2017-08-06 VITALS — BP 167/102 | HR 75 | Temp 97.7°F | Wt 260.2 lb

## 2017-08-06 DIAGNOSIS — E039 Hypothyroidism, unspecified: Secondary | ICD-10-CM

## 2017-08-06 DIAGNOSIS — E119 Type 2 diabetes mellitus without complications: Secondary | ICD-10-CM

## 2017-08-06 LAB — GLUCOSE, POCT (MANUAL RESULT ENTRY): POC Glucose: 117 mg/dl — AB (ref 70–99)

## 2017-08-06 MED ORDER — SITAGLIPTIN PHOSPHATE 100 MG PO TABS: 100.0000 mg | ORAL_TABLET | Freq: Every day | ORAL | 1 refills | Status: DC

## 2017-08-06 MED ORDER — LEVOTHYROXINE SODIUM 50 MCG PO TABS: 50.0000 ug | ORAL_TABLET | Freq: Every day | ORAL | 3 refills | Status: DC

## 2017-08-06 MED ORDER — HYDROCHLOROTHIAZIDE 25 MG PO TABS: 25.0000 mg | ORAL_TABLET | Freq: Every day | ORAL | 3 refills | Status: DC

## 2017-08-06 MED ORDER — LISINOPRIL 20 MG PO TABS: 40.0000 mg | ORAL_TABLET | Freq: Every day | ORAL | 3 refills | Status: DC

## 2017-08-06 MED ORDER — LEVOTHYROXINE SODIUM 200 MCG PO TABS: 200.0000 ug | ORAL_TABLET | Freq: Every day | ORAL | 3 refills | Status: DC

## 2017-08-06 NOTE — Progress Notes (Signed)
   Subjective:    Patient ID: Richard Leber., male    DOB: [birth year], 46 y.o.   MRN: [id number]  HPI  Pt is following up for lab results. According to the blood tests, more medication is needed to improve thyroid function. Pt is not experiencing any symptoms or side effects due to medication.   Patient Active Problem List   Diagnosis Date Noted  . Medication monitoring encounter 02/11/2017  . Tobacco abuse 02/11/2017  . Essential hypertension, benign 02/11/2017  . S/P thyroidectomy 01/23/2017  . Type 2 diabetes mellitus not at goal [facility]) 10/22/2016   Allergies as of 08/06/2017   No Known Allergies     Medication List       Accurate as of 08/06/17  9:43 AM. Always use your most recent med list.          hydrochlorothiazide 25 MG tablet Commonly known as:  HYDRODIURIL TAKE ONE TABLET BY MOUTH EVERY DAY   Insulin Glargine 100 UNIT/ML Solostar Pen Commonly known as:  LANTUS SOLOSTAR Start with 20 units each evening. Increase dose by 2 units every three days if morning blood sugar over 130, as directed.   levothyroxine 200 MCG tablet Commonly known as:  SYNTHROID, LEVOTHROID Take 1 tablet (200 mcg total) by mouth daily before breakfast.   lisinopril 20 MG tablet Commonly known as:  PRINIVIL,ZESTRIL Take 1 tablet (20 mg total) by mouth daily. Take two tablets dailly (40mg )   metFORMIN 1000 MG tablet Commonly known as:  GLUCOPHAGE TAKE ONE TABLET BY MOUTH 2 TIMES A DAY WITH A MEAL.   metoprolol tartrate 50 MG tablet Commonly known as:  LOPRESSOR TAKE ONE TABLET BY MOUTH 2 TIMES A DAY   omeprazole 20 MG capsule Commonly known as:  PRILOSEC Take 1 capsule (20 mg total) by mouth daily. At bedtime.   Pen Needles 32G X 5 MM Misc 1 each by Does not apply route daily.   sitaGLIPtin 100 MG tablet Commonly known as:  JANUVIA Take 1 tablet (100 mg total) by mouth daily.       Review of Systems     Objective:   Physical Exam  Constitutional: He is oriented to  person, place, and time.  Cardiovascular: Normal rate, regular rhythm and normal heart sounds.   Pulmonary/Chest: Effort normal and breath sounds normal.  Neurological: He is alert and oriented to person, place, and time.   BP (!) 167/102 (BP Location: Left Arm, Patient Position: Sitting)   Temp 97.7 F (36.5 C) (Oral)   Wt 260 lb 3.2 oz (118 kg)   BMI 30.07 kg/m       Assessment & Plan:   Follow-up in 6 weeks with labs a week prior.  Order labs; A1c, MetB, TSH  Refill medications: HCT2, Januvia, Lisinopril, and Levothyroxine.   Add another Levothyroxine (50mg , 90 tablets, 3 refills) to current Levothyroxine prescription (200mg ). Pt will now be taking 250mg  total per day.

## 2017-08-14 ENCOUNTER — Telehealth: Payer: Self-pay

## 2017-08-14 DIAGNOSIS — E0590 Thyrotoxicosis, unspecified without thyrotoxic crisis or storm: Secondary | ICD-10-CM

## 2017-08-14 MED ORDER — LEVOTHYROXINE SODIUM 25 MCG PO TABS: 25.0000 ug | ORAL_TABLET | Freq: Every day | ORAL | 3 refills | Status: DC

## 2017-08-14 NOTE — Telephone Encounter (Signed)
Received PAP application from [facility] for januvia placed for provider to sign.

## 2017-08-14 NOTE — Telephone Encounter (Signed)
Thyroid labs improved on 200 Mcg tablets..but still not at target.  Add a 25 mcg tablet #90, to the already 200 mcg tablet for total of 225 mcg a day. Will need Rx for the 25 Mcg tablets #i90 x 3 refills.    Called pt to give results from Dr. Mable Fill. Left message. Refill sent to [facility].

## 2017-08-14 NOTE — Telephone Encounter (Signed)
Placed signed application/script in [facility] folder for pickup. 

## 2017-08-19 ENCOUNTER — Telehealth: Payer: Self-pay | Admitting: Adult Health Nurse Practitioner

## 2017-08-19 NOTE — Telephone Encounter (Signed)
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 Wants 3 copies.

## 2017-08-28 ENCOUNTER — Telehealth: Payer: Self-pay | Admitting: Pharmacist

## 2017-08-28 NOTE — Telephone Encounter (Signed)
08/28/17 Mailing [employer] application for Januvia 100mg  Take 1 tablet daily.[employer]

## 2017-09-02 ENCOUNTER — Telehealth: Payer: Self-pay | Admitting: Adult Health Nurse Practitioner

## 2017-09-02 NOTE — Telephone Encounter (Signed)
Calling for [facility] and low on medications and wants refills

## 2017-09-03 ENCOUNTER — Ambulatory Visit: Payer: PRIVATE HEALTH INSURANCE | Admitting: Internal Medicine

## 2017-09-05 ENCOUNTER — Telehealth: Payer: Self-pay | Admitting: Pharmacist

## 2017-09-05 NOTE — Telephone Encounter (Signed)
09/05/17 Faxed refill request to [name] for Lantus Solostar pen Inject 30 units daily-Max dose # 3 -- Inject 20 units each evening, increase by 2 units every three days if AM BS is > 130.Marland KitchenDelos Haring

## 2017-09-11 ENCOUNTER — Other Ambulatory Visit: Payer: Self-pay

## 2017-09-11 ENCOUNTER — Other Ambulatory Visit: Payer: Self-pay | Admitting: Internal Medicine

## 2017-09-11 DIAGNOSIS — E119 Type 2 diabetes mellitus without complications: Secondary | ICD-10-CM

## 2017-09-11 DIAGNOSIS — E039 Hypothyroidism, unspecified: Secondary | ICD-10-CM

## 2017-09-11 LAB — POCT GLYCOSYLATED HEMOGLOBIN (HGB A1C): HEMOGLOBIN A1C: 6.3

## 2017-09-12 LAB — BASIC METABOLIC PANEL
BUN / CREAT RATIO: 23 — AB (ref 9–20)
BUN: 22 mg/dL (ref 6–24)
CHLORIDE: 100 mmol/L (ref 96–106)
CO2: 26 mmol/L (ref 20–29)
CREATININE: 0.94 mg/dL (ref 0.76–1.27)
Calcium: 9.6 mg/dL (ref 8.7–10.2)
GFR, EST AFRICAN AMERICAN: 113 mL/min/{1.73_m2} (ref >59)
GFR, EST NON AFRICAN AMERICAN: 98 mL/min/{1.73_m2} (ref >59)
Glucose: 125 mg/dL — ABNORMAL HIGH (ref 65–99)
Potassium: 4.4 mmol/L (ref 3.5–5.2)
Sodium: 141 mmol/L (ref 134–144)

## 2017-09-12 LAB — TSH: TSH: 3.31 u[IU]/mL (ref 0.450–4.500)

## 2017-09-17 ENCOUNTER — Ambulatory Visit: Payer: PRIVATE HEALTH INSURANCE | Admitting: Internal Medicine

## 2017-09-17 ENCOUNTER — Encounter: Payer: Self-pay | Admitting: Internal Medicine

## 2017-09-17 VITALS — BP 153/93 | HR 68 | Temp 97.8°F | Wt 264.5 lb

## 2017-09-17 DIAGNOSIS — Z0000 Encounter for general adult medical examination without abnormal findings: Secondary | ICD-10-CM

## 2017-09-17 NOTE — Progress Notes (Signed)
   Subjective:    Patient ID: Richard Riley., male    DOB: [birth year], 46 y.o.   MRN: [id number]  HPI Patient Active Problem List   Diagnosis Date Noted  . Medication monitoring encounter 02/11/2017  . Tobacco abuse 02/11/2017  . Essential hypertension, benign 02/11/2017  . S/P thyroidectomy 01/23/2017  . Type 2 diabetes mellitus not at goal [facility]) 10/22/2016   Pt here for follow up on labs. Pt states he has not been taking his lisinopril for the past month. He states that [facility] says they do not have his rx on file. He also states he has not been taking his [place] the past month and half due to [facility] not having the med in stock.    Review of Systems BP (!) 153/93   Pulse 68   Temp 97.8 F (36.6 C) (Oral)   Wt 264 lb 8 oz (120 kg)   BMI 30.57 kg/m   Allergies as of 09/17/2017   No Known Allergies     Medication List        Accurate as of 09/17/17  9:55 AM. Always use your most recent med list.          hydrochlorothiazide 25 MG tablet Commonly known as:  HYDRODIURIL Take 1 tablet (25 mg total) by mouth daily.   Insulin Glargine 100 UNIT/ML Solostar Pen Commonly known as:  LANTUS SOLOSTAR Start with 20 units each evening. Increase dose by 2 units every three days if morning blood sugar over 130, as directed.   levothyroxine 200 MCG tablet Commonly known as:  SYNTHROID, LEVOTHROID Take 1 tablet (200 mcg total) by mouth daily before breakfast.   levothyroxine 25 MCG tablet Commonly known as:  SYNTHROID, LEVOTHROID Take 1 tablet (25 mcg total) by mouth daily before breakfast.   lisinopril 20 MG tablet Commonly known as:  PRINIVIL,ZESTRIL Take 2 tablets (40 mg total) by mouth daily. Take two tablets dailly (40mg )   metFORMIN 1000 MG tablet Commonly known as:  GLUCOPHAGE TAKE ONE TABLET BY MOUTH 2 TIMES A DAY WITH A MEAL.   metoprolol tartrate 50 MG tablet Commonly known as:  LOPRESSOR TAKE ONE TABLET BY MOUTH 2 TIMES A DAY   omeprazole 20 MG capsule Commonly  known as:  PRILOSEC TAKE ONE CAPSULE BY MOUTH AT BEDTIME   Pen Needles 32G X 5 MM Misc 1 each by Does not apply route daily.   sitaGLIPtin 100 MG tablet Commonly known as:  JANUVIA Take 1 tablet (100 mg total) by mouth daily.          Objective:   Physical Exam  Constitutional: He is oriented to person, place, and time.  Cardiovascular: Normal rate, regular rhythm and normal heart sounds.  Pulmonary/Chest: Effort normal and breath sounds normal.  Neurological: He is alert and oriented to person, place, and time.          Assessment & Plan:  BP is elevated today, Pt has been out of medication because [facility] failed to provide the medications that have been prescribed on last visit. Effort will be made to correct this as soon as possible.  [name] wants to get those numbers down. Labs look good. Thyroid is balanced with medication pt is taking 225 mcg per day. RTC in 3 months. Labs prior.

## 2017-09-19 ENCOUNTER — Other Ambulatory Visit: Payer: Self-pay | Admitting: Internal Medicine

## 2017-09-19 DIAGNOSIS — E0590 Thyrotoxicosis, unspecified without thyrotoxic crisis or storm: Secondary | ICD-10-CM

## 2017-09-24 ENCOUNTER — Telehealth: Payer: Self-pay

## 2017-09-24 NOTE — Telephone Encounter (Signed)
Received PAP application from [facility] for lantus placed for provider to sign.  

## 2017-09-24 NOTE — Telephone Encounter (Signed)
Placed signed application/script in [facility] folder for pickup. 

## 2017-10-09 ENCOUNTER — Telehealth: Payer: Self-pay | Admitting: Pharmacist

## 2017-10-09 NOTE — Telephone Encounter (Signed)
10/12/17 Faxing [employer] a Programmer, applications for [name] daily dose 30 units daily, # 2 boxes.[name]

## 2017-10-10 ENCOUNTER — Telehealth: Payer: Self-pay | Admitting: Pharmacist

## 2017-10-10 NOTE — Telephone Encounter (Signed)
10/10/17 Received approval letter from [name] for [name] - patient enrolled till 10/25/2018.Richard Riley

## 2017-11-20 ENCOUNTER — Telehealth: Payer: Self-pay | Admitting: Pharmacist

## 2017-11-20 NOTE — Telephone Encounter (Signed)
11/20/17 Called [employer] and placed a refill on Januvia 100mg , allow 7-10 business days to receive.Richard Riley

## 2017-11-28 ENCOUNTER — Telehealth: Payer: Self-pay | Admitting: Pharmacist

## 2017-11-28 ENCOUNTER — Other Ambulatory Visit: Payer: Self-pay

## 2017-11-28 DIAGNOSIS — E0590 Thyrotoxicosis, unspecified without thyrotoxic crisis or storm: Secondary | ICD-10-CM

## 2017-11-28 NOTE — Telephone Encounter (Signed)
11/28/17 Sending [employer] refill request to [facility] for provider to sign to refill Lantus Solostar Inject Max 30 units once daily # 3 -Inject 20 units each evening, increase by 2 units every three days if AM BS >130.Richard Riley

## 2017-12-11 ENCOUNTER — Other Ambulatory Visit: Payer: Self-pay

## 2017-12-11 DIAGNOSIS — Z0000 Encounter for general adult medical examination without abnormal findings: Secondary | ICD-10-CM

## 2017-12-12 LAB — HEMOGLOBIN A1C
ESTIMATED AVERAGE GLUCOSE: 154 mg/dL
HEMOGLOBIN A1C: 7 % — AB (ref 4.8–5.6)

## 2017-12-12 LAB — LIPID PANEL
CHOL/HDL RATIO: 5.5 ratio — AB (ref 0.0–5.0)
Cholesterol, Total: 192 mg/dL (ref 100–199)
HDL: 35 mg/dL — ABNORMAL LOW (ref >39)
LDL CALC: 101 mg/dL — AB (ref 0–99)
Triglycerides: 279 mg/dL — ABNORMAL HIGH (ref 0–149)
VLDL Cholesterol Cal: 56 mg/dL — ABNORMAL HIGH (ref 5–40)

## 2017-12-12 LAB — CBC WITH DIFFERENTIAL
BASOS ABS: 0.1 10*3/uL (ref 0.0–0.2)
Basos: 1 %
EOS (ABSOLUTE): 0.4 10*3/uL (ref 0.0–0.4)
Eos: 4 %
Hematocrit: 52.7 % — ABNORMAL HIGH (ref 37.5–51.0)
Hemoglobin: 18 g/dL — ABNORMAL HIGH (ref 13.0–17.7)
IMMATURE GRANULOCYTES: 1 %
Immature Grans (Abs): 0.1 10*3/uL (ref 0.0–0.1)
LYMPHS: 31 %
Lymphocytes Absolute: 3.2 10*3/uL — ABNORMAL HIGH (ref 0.7–3.1)
MCH: 31.3 pg (ref 26.6–33.0)
MCHC: 34.2 g/dL (ref 31.5–35.7)
MCV: 92 fL (ref 79–97)
MONOS ABS: 0.7 10*3/uL (ref 0.1–0.9)
Monocytes: 7 %
NEUTROS PCT: 56 %
Neutrophils Absolute: 5.9 10*3/uL (ref 1.4–7.0)
RBC: 5.76 x10E6/uL (ref 4.14–5.80)
RDW: 13.1 % (ref 12.3–15.4)
WBC: 10.3 10*3/uL (ref 3.4–10.8)

## 2017-12-12 LAB — COMPREHENSIVE METABOLIC PANEL
ALT: 58 IU/L — AB (ref 0–44)
AST: 33 IU/L (ref 0–40)
Albumin/Globulin Ratio: 1.6 (ref 1.2–2.2)
Albumin: 4.9 g/dL (ref 3.5–5.5)
Alkaline Phosphatase: 75 IU/L (ref 39–117)
BILIRUBIN TOTAL: 0.8 mg/dL (ref 0.0–1.2)
BUN/Creatinine Ratio: 21 — ABNORMAL HIGH (ref 9–20)
BUN: 21 mg/dL (ref 6–24)
CALCIUM: 10.3 mg/dL — AB (ref 8.7–10.2)
CHLORIDE: 99 mmol/L (ref 96–106)
CO2: 26 mmol/L (ref 20–29)
Creatinine, Ser: 1.02 mg/dL (ref 0.76–1.27)
GFR calc Af Amer: 101 mL/min/{1.73_m2} (ref >59)
GFR, EST NON AFRICAN AMERICAN: 88 mL/min/{1.73_m2} (ref >59)
GLUCOSE: 215 mg/dL — AB (ref 65–99)
Globulin, Total: 3.1 g/dL (ref 1.5–4.5)
Potassium: 5.2 mmol/L (ref 3.5–5.2)
Sodium: 142 mmol/L (ref 134–144)
Total Protein: 8 g/dL (ref 6.0–8.5)

## 2017-12-12 LAB — URINALYSIS
BILIRUBIN UA: NEGATIVE
GLUCOSE, UA: NEGATIVE
KETONES UA: NEGATIVE
LEUKOCYTES UA: NEGATIVE
NITRITE UA: NEGATIVE
PH UA: 5 (ref 5.0–7.5)
SPEC GRAV UA: 1.027 (ref 1.005–1.030)
Urobilinogen, Ur: 0.2 mg/dL (ref 0.2–1.0)

## 2017-12-12 LAB — TSH: TSH: 10.32 u[IU]/mL — ABNORMAL HIGH (ref 0.450–4.500)

## 2017-12-12 LAB — T4, FREE: FREE T4: 1.65 ng/dL (ref 0.82–1.77)

## 2017-12-15 ENCOUNTER — Telehealth: Payer: Self-pay | Admitting: Pharmacist

## 2017-12-15 NOTE — Telephone Encounter (Signed)
12/15/17 Faxed [employer] refill request for Lantus Solostar Inject 20 units each evening, increase by 2 units every three days if AM BS>130---Max Daily Dose 30 units #3.Richard Riley

## 2017-12-17 ENCOUNTER — Ambulatory Visit: Payer: Self-pay | Admitting: Internal Medicine

## 2017-12-17 ENCOUNTER — Encounter: Payer: Self-pay | Admitting: Internal Medicine

## 2017-12-17 VITALS — BP 162/94 | HR 72 | Temp 97.7°F | Wt 266.1 lb

## 2017-12-17 DIAGNOSIS — E7800 Pure hypercholesterolemia, unspecified: Secondary | ICD-10-CM

## 2017-12-17 MED ORDER — ATORVASTATIN CALCIUM 20 MG PO TABS
20.0000 mg | ORAL_TABLET | Freq: Every day | ORAL | 3 refills | Status: DC
Start: 2017-12-17 — End: 2018-12-23

## 2017-12-17 NOTE — Progress Notes (Signed)
   Subjective:    Patient ID: Richard Riley., male    DOB: [birth year], 47 y.o.   MRN: [id number]  HPI BP (!) 162/94 (BP Location: Left Arm, Patient Position: Sitting)   Pulse 72   Temp 97.7 F (36.5 C) (Oral)   Wt 266 lb 1.6 oz (120.7 kg)   BMI 30.75 kg/m   Current Outpatient Medications on File Prior to Visit  Medication Sig Dispense Refill  . hydrochlorothiazide (HYDRODIURIL) 25 MG tablet Take 1 tablet (25 mg total) by mouth daily. 90 tablet 3  . Insulin Glargine (LANTUS SOLOSTAR) 100 UNIT/ML Solostar Pen Start with 20 units each evening. Increase dose by 2 units every three days if morning blood sugar over 130, as directed. 15 mL 11  . Insulin Pen Needle (PEN NEEDLES) 32G X 5 MM MISC 1 each by Does not apply route daily. 50 each 5  . levothyroxine (SYNTHROID, LEVOTHROID) 200 MCG tablet Take 1 tablet (200 mcg total) by mouth daily before breakfast. 90 tablet 3  . levothyroxine (SYNTHROID, LEVOTHROID) 25 MCG tablet Take 1 tablet (25 mcg total) by mouth daily before breakfast. 90 tablet 3  . lisinopril (PRINIVIL,ZESTRIL) 20 MG tablet Take 2 tablets (40 mg total) by mouth daily. Take two tablets dailly (40mg ) 90 tablet 3  . metFORMIN (GLUCOPHAGE) 1000 MG tablet TAKE ONE TABLET BY MOUTH 2 TIMES A DAY WITH A MEAL. 180 tablet 1  . metoprolol tartrate (LOPRESSOR) 50 MG tablet TAKE ONE TABLET BY MOUTH 2 TIMES A DAY 120 tablet 3  . omeprazole (PRILOSEC) 20 MG capsule TAKE ONE CAPSULE BY MOUTH AT BEDTIME 90 capsule 1  . sitaGLIPtin (JANUVIA) 100 MG tablet Take 1 tablet (100 mg total) by mouth daily. 90 tablet 1   No current facility-administered medications on file prior to visit.    Pt here for 3 month follow up. Bp is high today. Thyroid test is low again. A1c is elevated as well. Cholesterol is elevated. Pt states he has no pain when urinating. Bp 146/90 per Dr. Mable Fill. Pt checks his sugars around 3 times a week. He gets around 130 each time.   Review of Systems Patient Active Problem List    Diagnosis Date Noted  . Medication monitoring encounter 02/11/2017  . Tobacco abuse 02/11/2017  . Essential hypertension, benign 02/11/2017  . S/P thyroidectomy 01/23/2017  . Type 2 diabetes mellitus not at goal [facility]) 10/22/2016       Objective:   Physical Exam  Constitutional: He is oriented to person, place, and time.  Cardiovascular: Normal rate, regular rhythm and normal heart sounds.  Pulmonary/Chest: Effort normal and breath sounds normal.  Neurological: He is alert and oriented to person, place, and time.          Assessment & Plan:  Will do a UA today. Will put on a Cholesterol med. RTC in 6 weeks to check up on cholesterol, thyroid, and urine sample. Labs today clean catch UA and culture, TSH, Free t4, liver panal, and GGT. Will put on lipitor 20 mg.

## 2017-12-18 LAB — SPECIMEN STATUS REPORT

## 2017-12-18 LAB — HEPATIC FUNCTION PANEL
ALBUMIN: 4.7 g/dL (ref 3.5–5.5)
ALT: 51 IU/L — ABNORMAL HIGH (ref 0–44)
AST: 22 IU/L (ref 0–40)
Alkaline Phosphatase: 76 IU/L (ref 39–117)
BILIRUBIN TOTAL: 1 mg/dL (ref 0.0–1.2)
BILIRUBIN, DIRECT: 0.21 mg/dL (ref 0.00–0.40)
TOTAL PROTEIN: 7.6 g/dL (ref 6.0–8.5)

## 2017-12-18 LAB — URINALYSIS
BILIRUBIN UA: NEGATIVE
Ketones, UA: NEGATIVE
Leukocytes, UA: NEGATIVE
NITRITE UA: NEGATIVE
RBC UA: NEGATIVE
Urobilinogen, Ur: 0.2 mg/dL (ref 0.2–1.0)
pH, UA: 5 (ref 5.0–7.5)

## 2017-12-18 LAB — GAMMA GT: GGT: 29 IU/L (ref 0–65)

## 2017-12-18 LAB — T4, FREE: Free T4: 1.49 ng/dL (ref 0.82–1.77)

## 2017-12-18 LAB — TSH: TSH: 6.16 u[IU]/mL — AB (ref 0.450–4.500)

## 2017-12-20 LAB — URINE CULTURE: Organism ID, Bacteria: NO GROWTH

## 2017-12-20 LAB — SPECIMEN STATUS REPORT

## 2018-01-01 ENCOUNTER — Telehealth: Payer: Self-pay | Admitting: Pharmacy Technician

## 2018-01-01 NOTE — Telephone Encounter (Signed)
Patient eligible to receive medication assistance at [facility] through 2019, as long as eligibility requirements continue to be met.  [facility]

## 2018-01-13 ENCOUNTER — Ambulatory Visit: Payer: Self-pay

## 2018-01-15 ENCOUNTER — Encounter: Payer: Self-pay | Admitting: Adult Health

## 2018-01-15 ENCOUNTER — Ambulatory Visit: Payer: Self-pay | Admitting: Adult Health

## 2018-01-15 VITALS — BP 117/77 | HR 76 | Temp 97.8°F | Wt 263.3 lb

## 2018-01-15 DIAGNOSIS — B9789 Other viral agents as the cause of diseases classified elsewhere: Principal | ICD-10-CM

## 2018-01-15 DIAGNOSIS — J069 Acute upper respiratory infection, unspecified: Secondary | ICD-10-CM

## 2018-01-15 DIAGNOSIS — J0100 Acute maxillary sinusitis, unspecified: Secondary | ICD-10-CM

## 2018-01-15 DIAGNOSIS — E119 Type 2 diabetes mellitus without complications: Secondary | ICD-10-CM

## 2018-01-15 MED ORDER — FLUTICASONE PROPIONATE 50 MCG/ACT NA SUSP: 1.0000 | Freq: Two times a day (BID) | NASAL | 0 refills | Status: DC

## 2018-01-15 MED ORDER — BENZONATATE 100 MG PO CAPS: 100.0000 mg | ORAL_CAPSULE | Freq: Three times a day (TID) | ORAL | 0 refills | Status: AC

## 2018-01-15 NOTE — Progress Notes (Signed)
Patient: Richard Riley. Male    DOB: 21-Sep-1971   47 y.o.   MRN: [id number] Visit Date: 01/15/2018  Today's Provider: Mary Sella, NP   Chief Complaint  Patient presents with  . Follow-up    sinus symptoms   Subjective:    HPI  47 Y/O Male who present with nasal congestion, and cough x 5 days. Cough is productive of yellow sputum. Used saline nasal spray and cough drops without improvement. Denies fever, sob, PND,and  nasal drainage. Cough is hacking and interferes with sleep.  He is a former smoker.  He is adherent with all his medications and reports no issues related to his chronic health problems    No Known Allergies Previous Medications   ATORVASTATIN (LIPITOR) 20 MG TABLET    Take 1 tablet (20 mg total) by mouth daily.   HYDROCHLOROTHIAZIDE (HYDRODIURIL) 25 MG TABLET    Take 1 tablet (25 mg total) by mouth daily.   INSULIN GLARGINE (LANTUS SOLOSTAR) 100 UNIT/ML SOLOSTAR PEN    Start with 20 units each evening. Increase dose by 2 units every three days if morning blood sugar over 130, as directed.   INSULIN PEN NEEDLE (PEN NEEDLES) 32G X 5 MM MISC    1 each by Does not apply route daily.   LEVOTHYROXINE (SYNTHROID, LEVOTHROID) 200 MCG TABLET    Take 1 tablet (200 mcg total) by mouth daily before breakfast.   LEVOTHYROXINE (SYNTHROID, LEVOTHROID) 25 MCG TABLET    Take 1 tablet (25 mcg total) by mouth daily before breakfast.   LISINOPRIL (PRINIVIL,ZESTRIL) 20 MG TABLET    Take 2 tablets (40 mg total) by mouth daily. Take two tablets dailly (40mg )   METFORMIN (GLUCOPHAGE) 1000 MG TABLET    TAKE ONE TABLET BY MOUTH 2 TIMES A DAY WITH A MEAL.   METOPROLOL TARTRATE (LOPRESSOR) 50 MG TABLET    TAKE ONE TABLET BY MOUTH 2 TIMES A DAY   OMEPRAZOLE (PRILOSEC) 20 MG CAPSULE    TAKE ONE CAPSULE BY MOUTH AT BEDTIME   SITAGLIPTIN (JANUVIA) 100 MG TABLET    Take 1 tablet (100 mg total) by mouth daily.    Review of Systems  Constitutional: Negative for chills and fever.  HENT: Positive  for congestion, sinus pressure, sinus pain and sore throat. Negative for postnasal drip, rhinorrhea and sneezing.   Eyes: Negative for redness.  Respiratory: Positive for cough. Negative for choking, chest tightness and shortness of breath.    Objective:   BP 117/77   Pulse 76   Temp 97.8 F (36.6 C)   Wt 263 lb 4.8 oz (119.4 kg)   BMI 30.43 kg/m   Physical Exam  Constitutional: He appears well-developed and well-nourished. No distress.  HENT:  Mouth/Throat: Oropharynx is clear and moist.  Eyes: Conjunctivae and EOM are normal. Pupils are equal, round, and reactive to light.  Cardiovascular: Normal rate and regular rhythm.  Pulmonary/Chest: Effort normal and breath sounds normal. No respiratory distress. He has no wheezes. He has no rales.  Skin: He is not diaphoretic.      Assessment & Plan:  Viral URI with cough: Self-limiting. No need for antibiotics. Tessalon perls for cough, increase fluid intake and prn benadryl at bedtime  Acute maxillary sinusitis, recurrence not specified: Likely viral. No purulent nasal drainage and no fever. Continue saline nasal spray and flonase.  RTC if symptoms worsen Patient already has a scheduled appointment with Dr. Melynda Keller, NP   [facility]

## 2018-01-22 ENCOUNTER — Other Ambulatory Visit: Payer: Self-pay

## 2018-01-22 ENCOUNTER — Telehealth: Payer: Self-pay | Admitting: Pharmacist

## 2018-01-22 DIAGNOSIS — Z0000 Encounter for general adult medical examination without abnormal findings: Secondary | ICD-10-CM

## 2018-01-22 NOTE — Telephone Encounter (Signed)
01/22/2018 8:27:52 AM - [name] refill  01/22/18 Called [facility] to place refill for Januvia 100mg .[name]

## 2018-01-23 LAB — URINALYSIS
BILIRUBIN UA: NEGATIVE
KETONES UA: NEGATIVE
Leukocytes, UA: NEGATIVE
Nitrite, UA: NEGATIVE
PH UA: 5.5 (ref 5.0–7.5)
RBC UA: NEGATIVE
Urobilinogen, Ur: 0.2 mg/dL (ref 0.2–1.0)

## 2018-01-23 LAB — COMPREHENSIVE METABOLIC PANEL
ALK PHOS: 78 IU/L (ref 39–117)
ALT: 48 IU/L — AB (ref 0–44)
AST: 35 IU/L (ref 0–40)
Albumin/Globulin Ratio: 1.9 (ref 1.2–2.2)
Albumin: 4.6 g/dL (ref 3.5–5.5)
BUN/Creatinine Ratio: 20 (ref 9–20)
BUN: 20 mg/dL (ref 6–24)
Bilirubin Total: 0.8 mg/dL (ref 0.0–1.2)
CALCIUM: 9.5 mg/dL (ref 8.7–10.2)
CO2: 26 mmol/L (ref 20–29)
CREATININE: 1.02 mg/dL (ref 0.76–1.27)
Chloride: 95 mmol/L — ABNORMAL LOW (ref 96–106)
GFR calc Af Amer: 101 mL/min/{1.73_m2} (ref >59)
GFR, EST NON AFRICAN AMERICAN: 88 mL/min/{1.73_m2} (ref >59)
GLUCOSE: 197 mg/dL — AB (ref 65–99)
Globulin, Total: 2.4 g/dL (ref 1.5–4.5)
Potassium: 4.3 mmol/L (ref 3.5–5.2)
Sodium: 138 mmol/L (ref 134–144)
Total Protein: 7 g/dL (ref 6.0–8.5)

## 2018-01-23 LAB — LIPID PANEL
CHOL/HDL RATIO: 4.6 ratio (ref 0.0–5.0)
Cholesterol, Total: 120 mg/dL (ref 100–199)
HDL: 26 mg/dL — ABNORMAL LOW (ref >39)
LDL Calculated: 34 mg/dL (ref 0–99)
Triglycerides: 300 mg/dL — ABNORMAL HIGH (ref 0–149)
VLDL CHOLESTEROL CAL: 60 mg/dL — AB (ref 5–40)

## 2018-01-23 LAB — CBC
Hematocrit: 43.6 % (ref 37.5–51.0)
Hemoglobin: 14.8 g/dL (ref 13.0–17.7)
MCH: 30.4 pg (ref 26.6–33.0)
MCHC: 33.9 g/dL (ref 31.5–35.7)
MCV: 90 fL (ref 79–97)
PLATELETS: 150 10*3/uL (ref 150–379)
RBC: 4.87 x10E6/uL (ref 4.14–5.80)
RDW: 12.7 % (ref 12.3–15.4)
WBC: 11.2 10*3/uL — AB (ref 3.4–10.8)

## 2018-01-23 LAB — T4, FREE: Free T4: 1.61 ng/dL (ref 0.82–1.77)

## 2018-01-23 LAB — TSH: TSH: 5.93 u[IU]/mL — AB (ref 0.450–4.500)

## 2018-01-28 ENCOUNTER — Ambulatory Visit: Payer: Self-pay | Admitting: Internal Medicine

## 2018-01-28 ENCOUNTER — Encounter: Payer: Self-pay | Admitting: Internal Medicine

## 2018-01-28 ENCOUNTER — Other Ambulatory Visit: Payer: Self-pay

## 2018-01-28 VITALS — BP 163/93 | HR 82 | Temp 97.6°F | Wt 264.3 lb

## 2018-01-28 DIAGNOSIS — J0100 Acute maxillary sinusitis, unspecified: Secondary | ICD-10-CM

## 2018-01-28 DIAGNOSIS — E119 Type 2 diabetes mellitus without complications: Secondary | ICD-10-CM

## 2018-01-28 MED ORDER — FLUTICASONE PROPIONATE 50 MCG/ACT NA SUSP: 1.0000 | Freq: Two times a day (BID) | NASAL | 1 refills | Status: DC

## 2018-01-28 MED ORDER — LORATADINE 10 MG PO TABS: 10.0000 mg | ORAL_TABLET | Freq: Every day | ORAL | 3 refills | Status: DC

## 2018-01-28 MED ORDER — SULFAMETHOXAZOLE-TRIMETHOPRIM 800-160 MG PO TABS: 1.0000 | ORAL_TABLET | Freq: Two times a day (BID) | ORAL | 0 refills | Status: DC

## 2018-01-28 NOTE — Progress Notes (Signed)
NA

## 2018-01-28 NOTE — Progress Notes (Signed)
F/U labs.  Sinus infection for 1 month - has taken nasal spray and anti cough suppressant

## 2018-01-28 NOTE — Addendum Note (Signed)
Addended by: Meda Klinefelter D on: 01/28/2018 11:16 AM   Modules accepted: Orders

## 2018-01-28 NOTE — Progress Notes (Signed)
   Subjective:    Patient ID: Richard Riley., male    DOB: Dec 31, 1970, 47 y.o.   MRN: [id number]  HPI   Pt is here for a f/u on lab results. He presents with a chief complaint of sinus pressure and chest congestion, which he has had for about a month.   Allergies as of 01/28/2018   No Known Allergies     Medication List        Accurate as of 01/28/18 10:42 AM. Always use your most recent med list.          atorvastatin 20 MG tablet Commonly known as:  LIPITOR Take 1 tablet (20 mg total) by mouth daily.   fluticasone 50 MCG/ACT nasal spray Commonly known as:  FLONASE Place 1 spray into both nostrils 2 (two) times daily for 5 days.   hydrochlorothiazide 25 MG tablet Commonly known as:  HYDRODIURIL Take 1 tablet (25 mg total) by mouth daily.   Insulin Glargine 100 UNIT/ML Solostar Pen Commonly known as:  LANTUS SOLOSTAR Start with 20 units each evening. Increase dose by 2 units every three days if morning blood sugar over 130, as directed.   levothyroxine 200 MCG tablet Commonly known as:  SYNTHROID, LEVOTHROID Take 1 tablet (200 mcg total) by mouth daily before breakfast.   levothyroxine 25 MCG tablet Commonly known as:  SYNTHROID, LEVOTHROID Take 1 tablet (25 mcg total) by mouth daily before breakfast.   lisinopril 20 MG tablet Commonly known as:  PRINIVIL,ZESTRIL Take 2 tablets (40 mg total) by mouth daily. Take two tablets dailly (40mg )   metFORMIN 1000 MG tablet Commonly known as:  GLUCOPHAGE TAKE ONE TABLET BY MOUTH 2 TIMES A DAY WITH A MEAL.   metoprolol tartrate 50 MG tablet Commonly known as:  LOPRESSOR TAKE ONE TABLET BY MOUTH 2 TIMES A DAY   omeprazole 20 MG capsule Commonly known as:  PRILOSEC TAKE ONE CAPSULE BY MOUTH AT BEDTIME   Pen Needles 32G X 5 MM Misc 1 each by Does not apply route daily.   sitaGLIPtin 100 MG tablet Commonly known as:  JANUVIA Take 1 tablet (100 mg total) by mouth daily.      Patient Active Problem List   Diagnosis  Date Noted  . Medication monitoring encounter 02/11/2017  . Tobacco abuse 02/11/2017  . Essential hypertension, benign 02/11/2017  . S/P thyroidectomy 01/23/2017  . Type 2 diabetes mellitus not at goal [facility]) 10/22/2016      Review of Systems     Objective:   Physical Exam  Constitutional: He is oriented to person, place, and time.  Cardiovascular: Normal rate, regular rhythm and normal heart sounds.  Pulmonary/Chest: Effort normal and breath sounds normal.  Neurological: He is alert and oriented to person, place, and time.    BP (!) 163/93 (BP Location: Left Arm, Patient Position: Sitting, Cuff Size: Normal)   Pulse 82   Temp 97.6 F (36.4 C) (Oral)   Wt 264 lb 4.8 oz (119.9 kg)   BMI 30.54 kg/m        Assessment & Plan:   Prescribe medications for possible sinus infection. F/u in 3 months with labs a week prior.

## 2018-01-29 LAB — MICROALBUMIN, URINE: Microalbumin, Urine: 1158.5 ug/mL

## 2018-03-10 ENCOUNTER — Other Ambulatory Visit: Payer: Self-pay

## 2018-03-10 DIAGNOSIS — E119 Type 2 diabetes mellitus without complications: Secondary | ICD-10-CM

## 2018-03-10 MED ORDER — METFORMIN HCL 1000 MG PO TABS: ORAL_TABLET | ORAL | 1 refills | Status: DC

## 2018-03-31 ENCOUNTER — Telehealth: Payer: Self-pay | Admitting: Pharmacist

## 2018-03-31 NOTE — Telephone Encounter (Signed)
03/31/2018 10:14:33 AM - [name] refill  03/31/18 Per [employer] no refills left-patient enrolled till Oct. 2018. Printed script and sending to [facility] for Dr. Mable Fill to sign.[name]

## 2018-04-10 ENCOUNTER — Telehealth: Payer: Self-pay | Admitting: Pharmacist

## 2018-04-10 NOTE — Telephone Encounter (Signed)
04/10/2018 3:17:18 PM - Mailing script to [employer] for refill  04/10/18 mailing script to [employer] for refill on Januvia 100mg  Take one tablet by mouth every day. According to call patient enrolled till Oct. 2019-need script for refill.Richard Riley

## 2018-04-30 ENCOUNTER — Other Ambulatory Visit: Payer: Self-pay

## 2018-04-30 DIAGNOSIS — E119 Type 2 diabetes mellitus without complications: Secondary | ICD-10-CM

## 2018-05-01 LAB — COMPREHENSIVE METABOLIC PANEL
A/G RATIO: 1.8 (ref 1.2–2.2)
ALK PHOS: 82 IU/L (ref 39–117)
ALT: 44 IU/L (ref 0–44)
AST: 26 IU/L (ref 0–40)
Albumin: 4.8 g/dL (ref 3.5–5.5)
BILIRUBIN TOTAL: 1 mg/dL (ref 0.0–1.2)
BUN/Creatinine Ratio: 16 (ref 9–20)
BUN: 19 mg/dL (ref 6–24)
CHLORIDE: 101 mmol/L (ref 96–106)
CO2: 28 mmol/L (ref 20–29)
Calcium: 10.5 mg/dL — ABNORMAL HIGH (ref 8.7–10.2)
Creatinine, Ser: 1.22 mg/dL (ref 0.76–1.27)
GFR calc non Af Amer: 71 mL/min/{1.73_m2} (ref >59)
GFR, EST AFRICAN AMERICAN: 82 mL/min/{1.73_m2} (ref >59)
Globulin, Total: 2.7 g/dL (ref 1.5–4.5)
Glucose: 252 mg/dL — ABNORMAL HIGH (ref 65–99)
POTASSIUM: 4.5 mmol/L (ref 3.5–5.2)
Sodium: 143 mmol/L (ref 134–144)
Total Protein: 7.5 g/dL (ref 6.0–8.5)

## 2018-05-01 LAB — HEMOGLOBIN A1C
Est. average glucose Bld gHb Est-mCnc: 194 mg/dL
HEMOGLOBIN A1C: 8.4 % — AB (ref 4.8–5.6)

## 2018-05-01 LAB — URINALYSIS
Bilirubin, UA: NEGATIVE
LEUKOCYTES UA: NEGATIVE
Nitrite, UA: NEGATIVE
RBC, UA: NEGATIVE
Specific Gravity, UA: >=1.03 — AB (ref 1.005–1.030)
UUROB: 0.2 mg/dL (ref 0.2–1.0)
pH, UA: 5 (ref 5.0–7.5)

## 2018-05-01 LAB — TSH: TSH: 6.95 u[IU]/mL — ABNORMAL HIGH (ref 0.450–4.500)

## 2018-05-01 LAB — T4, FREE: Free T4: 1.47 ng/dL (ref 0.82–1.77)

## 2018-05-01 LAB — MICROALBUMIN, URINE: MICROALBUM., U, RANDOM: 1459.7 ug/mL

## 2018-05-06 ENCOUNTER — Ambulatory Visit: Payer: Self-pay | Admitting: Internal Medicine

## 2018-05-06 ENCOUNTER — Encounter: Payer: Self-pay | Admitting: Internal Medicine

## 2018-05-06 VITALS — BP 163/97 | HR 70 | Temp 97.7°F | Ht 75.0 in | Wt 266.1 lb

## 2018-05-06 DIAGNOSIS — E119 Type 2 diabetes mellitus without complications: Secondary | ICD-10-CM

## 2018-05-06 NOTE — Progress Notes (Signed)
Subjective:    Patient ID: Richard Riley., male    DOB: 1970-12-07, 47 y.o.   MRN: [id number]  HPI  Pt presents for FU appt regarding diabetes, hypertension, and hypothyroidism  Pt reports checking his blood glucose at home about every other day before breakfast   Review of Systems   Patient Active Problem List   Diagnosis Date Noted  . Medication monitoring encounter 02/11/2017  . Tobacco abuse 02/11/2017  . Essential hypertension, benign 02/11/2017  . S/P thyroidectomy 01/23/2017  . Type 2 diabetes mellitus not at goal [facility]) 10/22/2016   Allergies as of 05/06/2018   No Known Allergies     Medication List        Accurate as of 05/06/18  9:53 AM. Always use your most recent med list.          atorvastatin 20 MG tablet Commonly known as:  LIPITOR Take 1 tablet (20 mg total) by mouth daily.   cetirizine 10 MG tablet Commonly known as:  ZYRTEC Take 10 mg by mouth daily.   fluticasone 50 MCG/ACT nasal spray Commonly known as:  FLONASE Place 1 spray into both nostrils 2 (two) times daily.   hydrochlorothiazide 25 MG tablet Commonly known as:  HYDRODIURIL Take 1 tablet (25 mg total) by mouth daily.   Insulin Glargine 100 UNIT/ML Solostar Pen Commonly known as:  LANTUS SOLOSTAR Start with 20 units each evening. Increase dose by 2 units every three days if morning blood sugar over 130, as directed.   levothyroxine 200 MCG tablet Commonly known as:  SYNTHROID, LEVOTHROID Take 1 tablet (200 mcg total) by mouth daily before breakfast.   levothyroxine 25 MCG tablet Commonly known as:  SYNTHROID, LEVOTHROID Take 1 tablet (25 mcg total) by mouth daily before breakfast.   lisinopril 20 MG tablet Commonly known as:  PRINIVIL,ZESTRIL Take 2 tablets (40 mg total) by mouth daily. Take two tablets dailly (40mg )   loratadine 10 MG tablet Commonly known as:  CLARITIN Take 1 tablet (10 mg total) by mouth daily.   metFORMIN 1000 MG tablet Commonly known as:   GLUCOPHAGE TAKE ONE TABLET BY MOUTH 2 TIMES A DAY WITH A MEAL.   metoprolol tartrate 50 MG tablet Commonly known as:  LOPRESSOR TAKE ONE TABLET BY MOUTH 2 TIMES A DAY   omeprazole 20 MG capsule Commonly known as:  PRILOSEC TAKE ONE CAPSULE BY MOUTH AT BEDTIME   Pen Needles 32G X 5 MM Misc 1 each by Does not apply route daily.   sitaGLIPtin 100 MG tablet Commonly known as:  JANUVIA Take 1 tablet (100 mg total) by mouth daily.   sulfamethoxazole-trimethoprim 800-160 MG tablet Commonly known as:  BACTRIM DS,SEPTRA DS Take 1 tablet by mouth 2 (two) times daily.           Objective:   Physical Exam  Constitutional: He is oriented to person, place, and time.  Cardiovascular: Normal rate, regular rhythm and normal heart sounds.  Pulmonary/Chest: Effort normal and breath sounds normal. No respiratory distress.  Neurological: He is alert and oriented to person, place, and time.    BP (!) 163/97 (BP Location: Left Arm, Patient Position: Sitting)   Pulse 70   Temp 97.7 F (36.5 C) (Oral)   Ht 6\' 3"  (1.905 m)   Wt 266 lb 1.6 oz (120.7 kg)   BMI 33.26 kg/m   BP in exam room at 10:05AM --- 130/88    Assessment & Plan:   Referral for Endocrinology clinic for evaluation of  diabetes (unmanaged) and assessment of thyroid medication (is he on the appropriate dose?)  Pt advised to check blood glucose 1 week prior to endo appt at different times during the day before meals  Follow up in 3 months with labs one week prior

## 2018-05-13 ENCOUNTER — Telehealth: Payer: Self-pay | Admitting: Pharmacist

## 2018-05-13 NOTE — Telephone Encounter (Signed)
05/13/2018 10:39:50 AM - Lantus Solostar refill  05/13/18 Printed [employer] refill request for [name] 30 units daily in the evening #2; Inject 20 units each evening, increase by 2 units every three days if BS is > 130. Sending refill request to [facility] for Dr. Mable Fill to sign for refill.Richard Riley

## 2018-05-22 ENCOUNTER — Telehealth: Payer: Self-pay | Admitting: Pharmacist

## 2018-05-22 NOTE — Telephone Encounter (Signed)
05/22/2018 9:26:50 AM - Lantus Solostar refill  05/22/18 Faxed [employer] refill request for Lantus Solostar Max 30 units daily in the evening # 2 - Inject 20 units each evening, increase by 2 units every three days if BS is > 130.[name]

## 2018-05-26 IMAGING — US US SOFT TISSUE HEAD/NECK
1 series · 13 of 25 positions shown · non-contrast
Comparison: Scintigraphy 08/05/2016

CLINICAL DATA: Cold nodules suspected on recent scintigraphy .
Hyperthyroidism.

EXAM:
THYROID ULTRASOUND
TECHNIQUE: Ultrasound examination of the thyroid gland and adjacent soft
tissues was performed.

[Series 1: us soft tissue head/neck · 0.08mm/px · 13 of 101 slices shown]
[im 1/101]
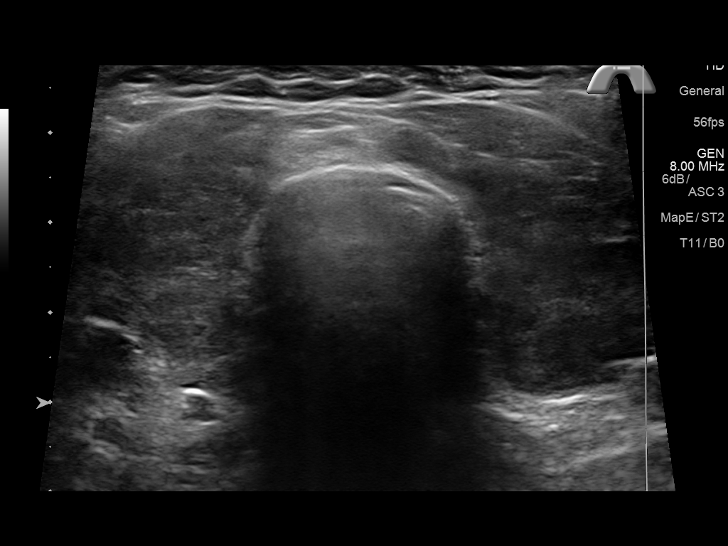
[im 9/101]
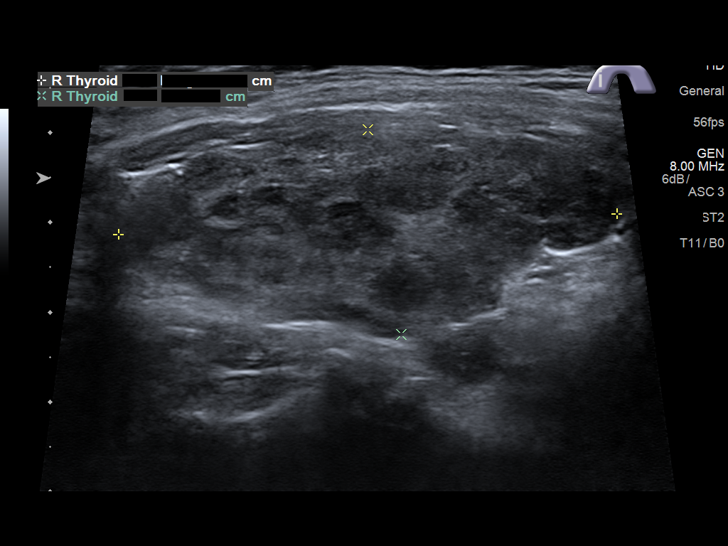
[im 17/101]
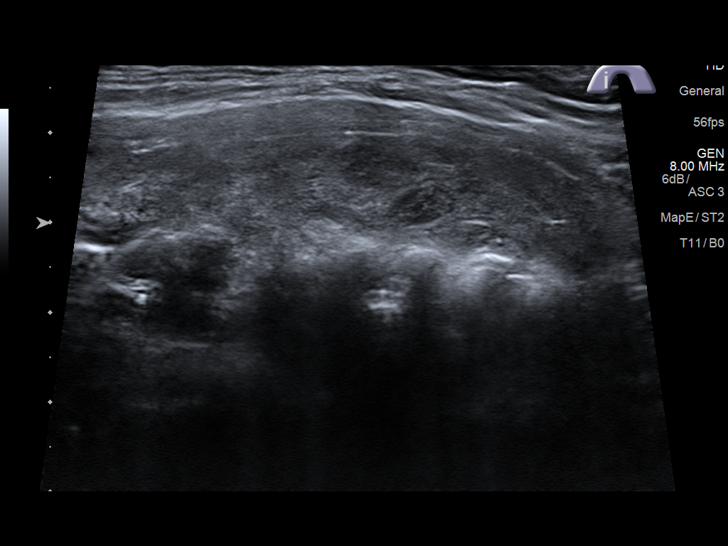
[im 26/101]
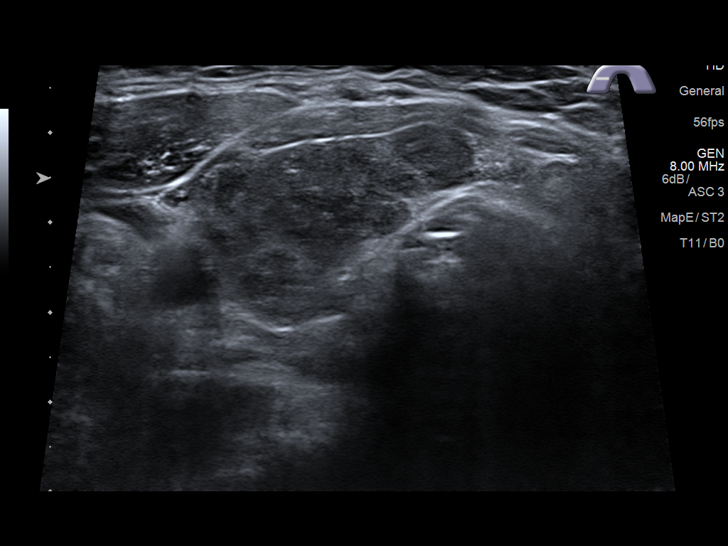
[im 34/101]
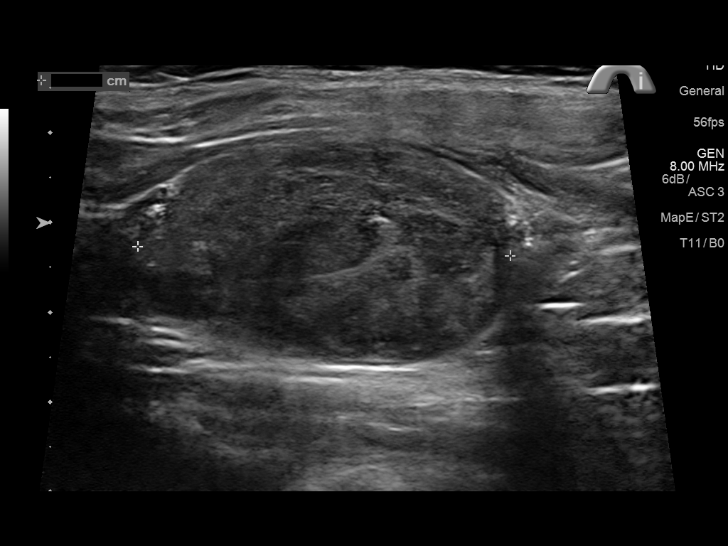
[im 42/101]
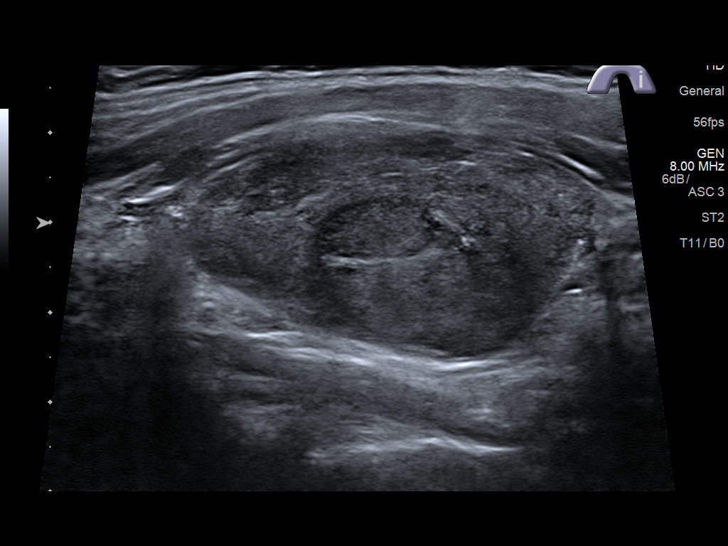
[im 51/101]
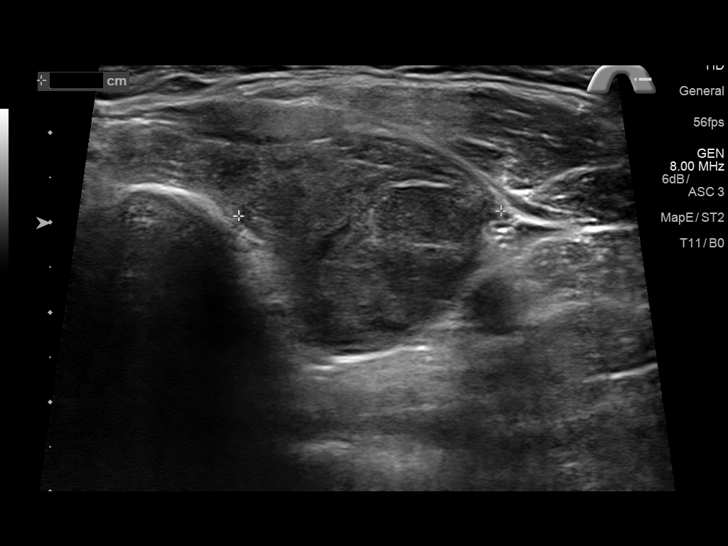
[im 59/101]
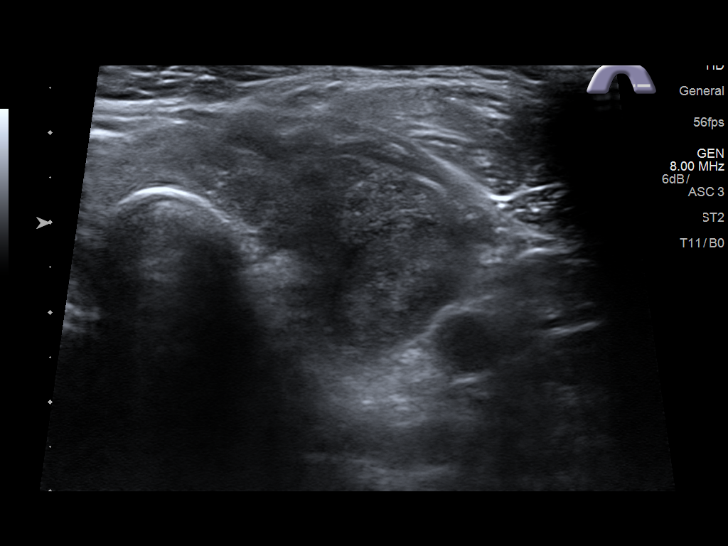
[im 67/101]
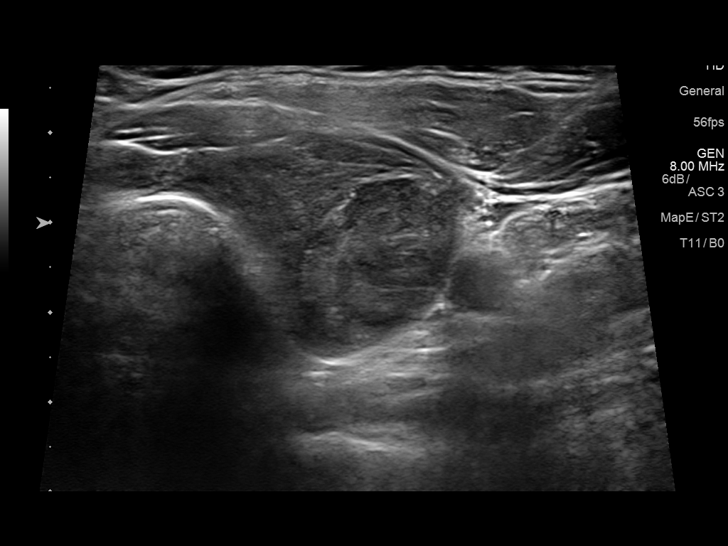
[im 76/101]
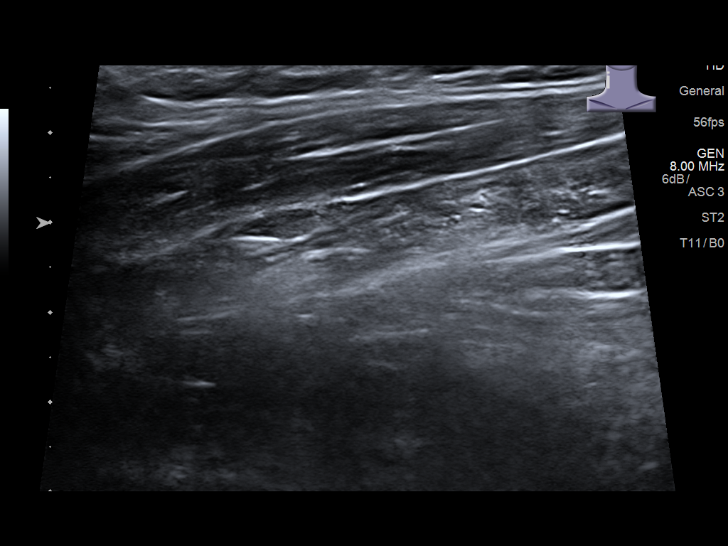
[im 84/101]
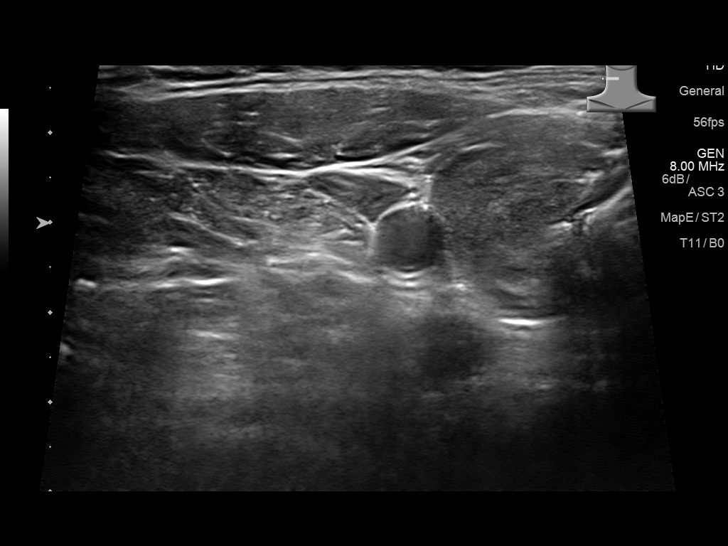
[im 92/101]
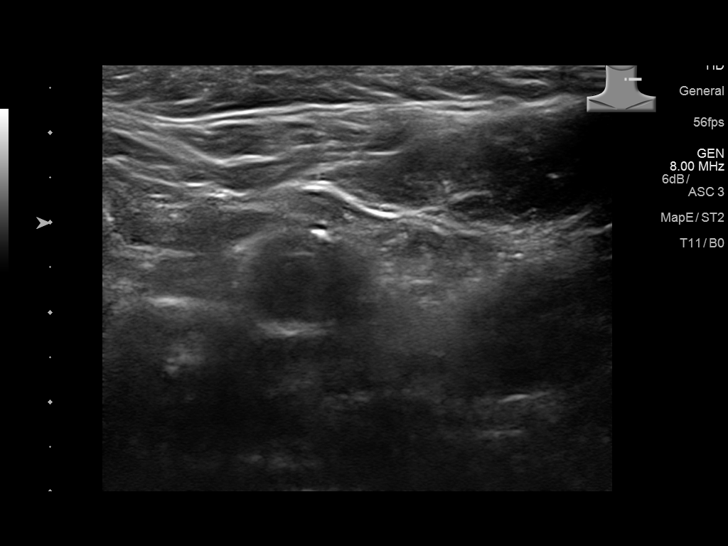
[im 101/101]
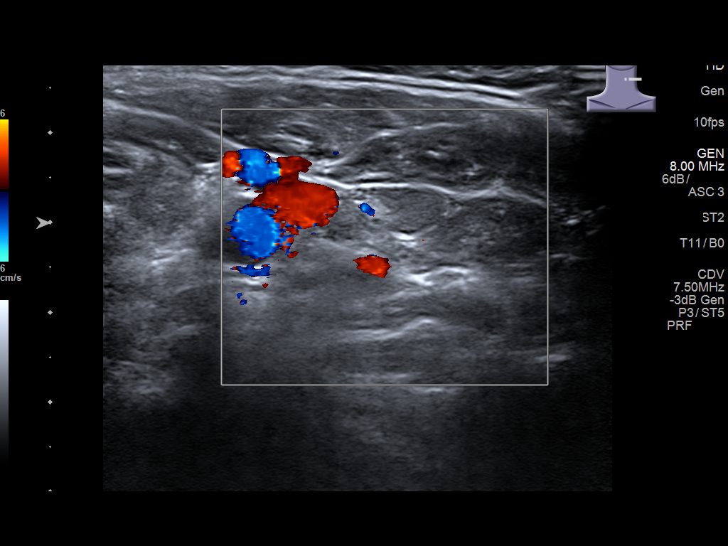

[13 of 25 positions shown; findings below may reference images not displayed]

FINDINGS: Parenchymal Echotexture: Moderately heterogenous

Estimated total number of nodules >/= 1 cm: 1

Number of spongiform nodules >/=  2 cm not described below (TR1): 0

Number of mixed cystic and solid nodules >/= 1.5 cm not described
below (TR2): 0

_________________________________________________________

Isthmus: 0.6 cm thickness

No discrete nodules are identified within the thyroid isthmus.

_________________________________________________________

Right lobe: 5.6 x 2.3 x 2.6 cm

Nodular heterogeneous contour without discrete lesion.

_________________________________________________________

Left lobe: 4.2 x 2.4 x 2.9 cm

Nodule # 1:

Location: Left; Inferior

Size: 2.7 x 1.9 x 1.7 cm

Composition: solid/almost completely solid (2)

Echogenicity: hypoechoic (2)

Shape: taller-than-wide (3)

Margins: smooth (0)

Echogenic foci: none (0)

ACR TI-RADS total points: 7.

ACR TI-RADS risk category: TR5 (>/= 7 points).

ACR TI-RADS recommendations:

**Given size (>/= 1.0 cm) and appearance, fine needle aspiration of
this highly suspicious nodule should be considered based on TI-RADS
criteria.

The lesion corresponds to a cold defect on scintigraphy
IMPRESSION: 1. Suspicious 2.7 cm inferior left nodule.  Recommend FNA biopsy.

The above is in keeping with the ACR TI-RADS recommendations - [HOSPITAL] 3937;[DATE].

## 2018-06-02 ENCOUNTER — Telehealth: Payer: Self-pay | Admitting: Pharmacist

## 2018-06-02 ENCOUNTER — Other Ambulatory Visit: Payer: Self-pay | Admitting: Internal Medicine

## 2018-06-02 DIAGNOSIS — E0590 Thyrotoxicosis, unspecified without thyrotoxic crisis or storm: Secondary | ICD-10-CM

## 2018-06-02 DIAGNOSIS — J0100 Acute maxillary sinusitis, unspecified: Secondary | ICD-10-CM

## 2018-06-02 NOTE — Telephone Encounter (Signed)
06/02/2018 2:46:41 PM - Januvia call to [employer] on status  06/02/18 Inez Catalina brought me chart to check on Januvia with [employer]-a script was mailed 04/10/18 and we have not received-Betty received a call today from patient. I called [employer] spoke with Drake Leach stated that there was 1 refill left, she processed while I as on the phone allow 10-14 days to receive. This will be last refill-patient will need renewal in Oct. 2019. Irma was not sure why I was told no refills.[name]

## 2018-07-10 ENCOUNTER — Telehealth: Payer: Self-pay | Admitting: Pharmacist

## 2018-07-10 NOTE — Telephone Encounter (Signed)
07/10/2018 9:42:15 AM - [name] pending  07/10/18 I have received the provider portion of [employer] application for Januvia 100mg  back, I am holding for patient to return his portion, this was mailed to patient 07/01/18.Richard Riley

## 2018-07-28 ENCOUNTER — Other Ambulatory Visit: Payer: Self-pay

## 2018-07-28 DIAGNOSIS — E119 Type 2 diabetes mellitus without complications: Secondary | ICD-10-CM

## 2018-07-29 LAB — COMPREHENSIVE METABOLIC PANEL
ALT: 53 IU/L — ABNORMAL HIGH (ref 0–44)
AST: 35 IU/L (ref 0–40)
Albumin/Globulin Ratio: 2 (ref 1.2–2.2)
Albumin: 5 g/dL (ref 3.5–5.5)
Alkaline Phosphatase: 77 IU/L (ref 39–117)
BILIRUBIN TOTAL: 0.9 mg/dL (ref 0.0–1.2)
BUN / CREAT RATIO: 20 (ref 9–20)
BUN: 19 mg/dL (ref 6–24)
CALCIUM: 9.9 mg/dL (ref 8.7–10.2)
CHLORIDE: 100 mmol/L (ref 96–106)
CO2: 24 mmol/L (ref 20–29)
Creatinine, Ser: 0.93 mg/dL (ref 0.76–1.27)
GFR, EST AFRICAN AMERICAN: 113 mL/min/{1.73_m2} (ref >59)
GFR, EST NON AFRICAN AMERICAN: 98 mL/min/{1.73_m2} (ref >59)
GLOBULIN, TOTAL: 2.5 g/dL (ref 1.5–4.5)
Glucose: 187 mg/dL — ABNORMAL HIGH (ref 65–99)
Potassium: 3.9 mmol/L (ref 3.5–5.2)
SODIUM: 143 mmol/L (ref 134–144)
TOTAL PROTEIN: 7.5 g/dL (ref 6.0–8.5)

## 2018-07-29 LAB — CBC
HEMATOCRIT: 46.9 % (ref 37.5–51.0)
Hemoglobin: 16.1 g/dL (ref 13.0–17.7)
MCH: 30.7 pg (ref 26.6–33.0)
MCHC: 34.3 g/dL (ref 31.5–35.7)
MCV: 90 fL (ref 79–97)
Platelets: 162 10*3/uL (ref 150–450)
RBC: 5.24 x10E6/uL (ref 4.14–5.80)
RDW: 13.5 % (ref 12.3–15.4)
WBC: 9.8 10*3/uL (ref 3.4–10.8)

## 2018-07-29 LAB — LIPID PANEL
CHOL/HDL RATIO: 4.3 ratio (ref 0.0–5.0)
CHOLESTEROL TOTAL: 163 mg/dL (ref 100–199)
HDL: 38 mg/dL — AB (ref >39)
LDL Calculated: 81 mg/dL (ref 0–99)
Triglycerides: 219 mg/dL — ABNORMAL HIGH (ref 0–149)
VLDL Cholesterol Cal: 44 mg/dL — ABNORMAL HIGH (ref 5–40)

## 2018-07-29 LAB — TSH: TSH: 8.29 u[IU]/mL — ABNORMAL HIGH (ref 0.450–4.500)

## 2018-07-29 LAB — HEMOGLOBIN A1C
Est. average glucose Bld gHb Est-mCnc: 246 mg/dL
Hgb A1c MFr Bld: 10.2 % — ABNORMAL HIGH (ref 4.8–5.6)

## 2018-07-31 ENCOUNTER — Telehealth: Payer: Self-pay | Admitting: Pharmacist

## 2018-07-31 NOTE — Telephone Encounter (Signed)
07/31/2018 11:16:00 AM - Richard Riley  07/31/18 Mailing [employer] application for [employer] 100mg  Take 1 tablet by mouth once daily.[name]

## 2018-08-05 ENCOUNTER — Ambulatory Visit: Payer: Self-pay | Admitting: Internal Medicine

## 2018-08-05 ENCOUNTER — Encounter: Payer: Self-pay | Admitting: Internal Medicine

## 2018-08-05 VITALS — BP 150/89 | HR 74 | Temp 97.5°F | Ht 73.0 in | Wt 256.8 lb

## 2018-08-05 DIAGNOSIS — I10 Essential (primary) hypertension: Secondary | ICD-10-CM

## 2018-08-05 DIAGNOSIS — E119 Type 2 diabetes mellitus without complications: Secondary | ICD-10-CM

## 2018-08-05 DIAGNOSIS — E890 Postprocedural hypothyroidism: Secondary | ICD-10-CM

## 2018-08-05 NOTE — Progress Notes (Signed)
Subjective:    Patient ID: Richard Leber., male    DOB: [birth year], 47 y.o.   MRN: [id number]  HPI  Pt reports feelings well and does not have any medical complaints. He also reports continuing medications as prescribed.    Pt does report experiencing unexplained fatigue.   Review of Systems Patient Active Problem List   Diagnosis Date Noted  . Medication monitoring encounter 02/11/2017  . Tobacco abuse 02/11/2017  . Essential hypertension, benign 02/11/2017  . S/P thyroidectomy 01/23/2017  . Type 2 diabetes mellitus not at goal [facility]) 10/22/2016   Allergies as of 08/05/2018   No Known Allergies     Medication List        Accurate as of 08/05/18 10:00 AM. Always use your most recent med list.          atorvastatin 20 MG tablet Commonly known as:  LIPITOR Take 1 tablet (20 mg total) by mouth daily.   cetirizine 10 MG tablet Commonly known as:  ZYRTEC Take 10 mg by mouth daily.   cetirizine 10 MG tablet Commonly known as:  ZYRTEC TAKE ONE TABLET BY MOUTH EVERY DAY   fluticasone 50 MCG/ACT nasal spray Commonly known as:  FLONASE Place 1 spray into both nostrils 2 (two) times daily.   hydrochlorothiazide 25 MG tablet Commonly known as:  HYDRODIURIL Take 1 tablet (25 mg total) by mouth daily.   Insulin Glargine 100 UNIT/ML Solostar Pen Commonly known as:  LANTUS Start with 20 units each evening. Increase dose by 2 units every three days if morning blood sugar over 130, as directed.   levothyroxine 200 MCG tablet Commonly known as:  SYNTHROID, LEVOTHROID Take 1 tablet (200 mcg total) by mouth daily before breakfast.   levothyroxine 25 MCG tablet Commonly known as:  SYNTHROID, LEVOTHROID Take 1 tablet (25 mcg total) by mouth daily before breakfast.   lisinopril 20 MG tablet Commonly known as:  PRINIVIL,ZESTRIL Take 2 tablets (40 mg total) by mouth daily. Take two tablets dailly ('40mg'$ )   metFORMIN 1000 MG tablet Commonly known as:  GLUCOPHAGE TAKE ONE  TABLET BY MOUTH 2 TIMES A DAY WITH A MEAL.   metoprolol tartrate 50 MG tablet Commonly known as:  LOPRESSOR TAKE ONE TABLET BY MOUTH 2 TIMES A DAY   omeprazole 20 MG capsule Commonly known as:  PRILOSEC TAKE ONE CAPSULE BY MOUTH AT BEDTIME   Pen Needles 32G X 5 MM Misc 1 each by Does not apply route daily.   sitaGLIPtin 100 MG tablet Commonly known as:  JANUVIA Take 1 tablet (100 mg total) by mouth daily.   sulfamethoxazole-trimethoprim 800-160 MG tablet Commonly known as:  BACTRIM DS,SEPTRA DS Take 1 tablet by mouth 2 (two) times daily.          Objective:   Physical Exam  Constitutional: He is oriented to person, place, and time.  Cardiovascular: Normal rate, regular rhythm and normal heart sounds.  Pulmonary/Chest: Effort normal and breath sounds normal.  Neurological: He is alert and oriented to person, place, and time.   BP (!) 150/89   Pulse 74   Temp (!) 97.5 F (36.4 C) (Oral)   Ht '6\' 1"'$  (1.854 m)   Wt 256 lb 12.8 oz (116.5 kg)   BMI 33.88 kg/m      Assessment & Plan:   Refer to [facility] Endocrinology: Pt's A1C continues to increase; 8.4 (04/30/18) to 10.2 (07/28/18); Also to assess thyroid medication dose.   Return in 4 months with labs (A1c,  T4, TSH, Met C, CBC, UA, & Microalbumin)

## 2018-08-19 ENCOUNTER — Ambulatory Visit: Payer: Self-pay | Admitting: Internal Medicine

## 2018-09-22 ENCOUNTER — Ambulatory Visit: Payer: Self-pay

## 2018-09-22 VITALS — BP 140/93 | HR 74 | Temp 97.7°F | Ht 72.0 in | Wt 265.0 lb

## 2018-09-22 LAB — GLUCOSE, POCT (MANUAL RESULT ENTRY): POC Glucose: 271 mg/dl — AB (ref 70–99)

## 2018-09-23 ENCOUNTER — Other Ambulatory Visit: Payer: Self-pay | Admitting: Internal Medicine

## 2018-09-23 DIAGNOSIS — E119 Type 2 diabetes mellitus without complications: Secondary | ICD-10-CM

## 2018-09-23 DIAGNOSIS — J0100 Acute maxillary sinusitis, unspecified: Secondary | ICD-10-CM

## 2018-09-23 DIAGNOSIS — E0590 Thyrotoxicosis, unspecified without thyrotoxic crisis or storm: Secondary | ICD-10-CM

## 2018-09-24 ENCOUNTER — Ambulatory Visit: Payer: Self-pay | Admitting: Family Medicine

## 2018-09-24 VITALS — BP 144/90 | HR 72 | Temp 97.7°F | Ht 72.0 in | Wt 267.6 lb

## 2018-09-24 DIAGNOSIS — E119 Type 2 diabetes mellitus without complications: Secondary | ICD-10-CM

## 2018-09-24 DIAGNOSIS — E1169 Type 2 diabetes mellitus with other specified complication: Secondary | ICD-10-CM | POA: Insufficient documentation

## 2018-09-24 DIAGNOSIS — E890 Postprocedural hypothyroidism: Secondary | ICD-10-CM

## 2018-09-24 DIAGNOSIS — Z9889 Other specified postprocedural states: Secondary | ICD-10-CM

## 2018-09-24 DIAGNOSIS — E785 Hyperlipidemia, unspecified: Secondary | ICD-10-CM

## 2018-09-24 MED ORDER — LEVOTHYROXINE SODIUM 75 MCG PO TABS: 75.0000 ug | ORAL_TABLET | Freq: Every day | ORAL | 0 refills | Status: DC

## 2018-09-24 MED ORDER — LEVOTHYROXINE SODIUM 200 MCG PO TABS: ORAL_TABLET | ORAL | 0 refills | Status: DC

## 2018-09-24 NOTE — Progress Notes (Signed)
Subjective:    Patient ID: Richard Riley., male    DOB: [birth year], 47 y.o.   MRN: [id number]  HPI  1.  DM.  He is on lantus but has not been taking it consistently, saying he based the dose on his BS at the time.  He varied his dose from 0-40 units a night.  Is taking Metformin and Januvia regularly.  Has fatigue.  Some increased urination.  No hypoglycemic symptoms.  A1c was 10.2 on 07/28/18. 2.  Hypothyroidism.  Has been on Synthroid 225 ug daily for many months. Has had fatigue and weight gain for months.  Is colder than usual.  BM's are more or less the same.  TSH was 8.29 when last checked. 3.  Hyperlipidemia.  Is on Atorvastatin 20 mg daily. No side effect son the med.  Liver enzymes are normal.  Review of Systems Patient Active Problem List   Diagnosis Date Noted  . Medication monitoring encounter 02/11/2017  . Tobacco abuse 02/11/2017  . Essential hypertension, benign 02/11/2017  . S/P thyroidectomy 01/23/2017  . Type 2 diabetes mellitus not at goal [facility]) 10/22/2016   Allergies as of 09/24/2018   No Known Allergies     Medication List        Accurate as of 09/24/18  5:46 PM. Always use your most recent med list.          atorvastatin 20 MG tablet Commonly known as:  LIPITOR Take 1 tablet (20 mg total) by mouth daily.   cetirizine 10 MG tablet Commonly known as:  ZYRTEC Take 10 mg by mouth daily.   cetirizine 10 MG tablet Commonly known as:  ZYRTEC TAKE ONE TABLET BY MOUTH EVERY DAY   fluticasone 50 MCG/ACT nasal spray Commonly known as:  FLONASE PLACE 1 SPRAY INTO BOTH NOSTRILS 2 TIMES A DAY   hydrochlorothiazide 25 MG tablet Commonly known as:  HYDRODIURIL TAKE ONE TABLET BY MOUTH EVERY DAY   Insulin Glargine 100 UNIT/ML Solostar Pen Commonly known as:  LANTUS Start with 20 units each evening. Increase dose by 2 units every three days if morning blood sugar over 130, as directed.   levothyroxine 25 MCG tablet Commonly known as:  SYNTHROID,  LEVOTHROID Take 1 tablet (25 mcg total) by mouth daily before breakfast.   levothyroxine 200 MCG tablet Commonly known as:  SYNTHROID, LEVOTHROID TAKE ONE TABLET BY MOUTH EVERY DAY BEFORE BREAKFAST   lisinopril 40 MG tablet Commonly known as:  PRINIVIL,ZESTRIL TAKE ONE TABLET (40 MG) BY MOUTH EVERY DAY   metFORMIN 1000 MG tablet Commonly known as:  GLUCOPHAGE TAKE ONE TABLET BY MOUTH 2 TIMES A DAY WITH A MEAL.   metoprolol tartrate 50 MG tablet Commonly known as:  LOPRESSOR TAKE ONE TABLET BY MOUTH 2 TIMES A DAY   omeprazole 20 MG capsule Commonly known as:  PRILOSEC TAKE ONE CAPSULE BY MOUTH AT BEDTIME   Pen Needles 32G X 5 MM Misc 1 each by Does not apply route daily.   sitaGLIPtin 100 MG tablet Commonly known as:  JANUVIA Take 1 tablet (100 mg total) by mouth daily.          Objective:   Physical Exam  Constitutional: He is oriented to person, place, and time.  Cardiovascular: Normal rate, regular rhythm and normal heart sounds.  Pulmonary/Chest: Effort normal and breath sounds normal.  Neurological: He is alert and oriented to person, place, and time.  Abdomen: NT/ND, mildly obese. EXT: +2 pulse, no edema.  BP Marland Kitchen)  144/90   Pulse 72   Temp 97.7 F (36.5 C)   Ht 6' (1.829 m)   Wt 267 lb 9.6 oz (121.4 kg)   BMI 36.29 kg/m      Assessment & Plan:  1.  DM.  Explained that Lantus needed to be taken every day and dose only adjusted after assessing its effects after several days.  Can restart Lantus 20 units nightly.  Can titrate up the dose 2 units every 4-5 days if BS's are running > 130-140.  Stay on oral meds as currently. 2.   Hypothyroidism.  Increased Levothroid ot 275 ug daily.  Check labs in 2 months. 3.  Hyperlipidemia.  Stay on Atorvastatin 20 mg daily.  4.  Return in about 2 months for labs (A1c, T4, TSH, Met C, CBC, UA, & Microalbumin) and recheck.

## 2018-10-19 ENCOUNTER — Telehealth: Payer: Self-pay | Admitting: Pharmacist

## 2018-10-19 NOTE — Telephone Encounter (Signed)
10/19/2018 12:03:59 PM - Lantus Solostar pending  10/19/18 I have received the provider portion of [employer] application back for Lantus Solostar, holding for patient to return his portion--mailed to patient 10/06/18.[name]

## 2018-10-23 ENCOUNTER — Telehealth: Payer: Self-pay | Admitting: Pharmacist

## 2018-10-23 NOTE — Telephone Encounter (Signed)
10/23/2018 10:25:58 AM - Lantus Solostar renewal  10/23/18 Faxed [employer] renewal application for Lantus Solostar Inject max 30 units daily #3.[name]

## 2018-10-30 ENCOUNTER — Telehealth: Payer: Self-pay | Admitting: Pharmacist

## 2018-10-30 NOTE — Telephone Encounter (Signed)
10/30/2018 10:52:26 AM - [name] refill  10/30/18 Called [employer] spoke with Jonelle Sidle for refill on Januvia 100mg , this leaves 2 refills, allow 7-10 business days to receive.[name]

## 2018-12-01 ENCOUNTER — Other Ambulatory Visit: Payer: Self-pay

## 2018-12-08 ENCOUNTER — Other Ambulatory Visit: Payer: Self-pay

## 2018-12-08 DIAGNOSIS — I10 Essential (primary) hypertension: Secondary | ICD-10-CM

## 2018-12-08 DIAGNOSIS — E890 Postprocedural hypothyroidism: Secondary | ICD-10-CM

## 2018-12-08 DIAGNOSIS — E119 Type 2 diabetes mellitus without complications: Secondary | ICD-10-CM

## 2018-12-09 ENCOUNTER — Ambulatory Visit: Payer: Self-pay | Admitting: Internal Medicine

## 2018-12-09 LAB — COMPREHENSIVE METABOLIC PANEL
ALBUMIN: 4.2 g/dL (ref 4.0–5.0)
ALK PHOS: 67 IU/L (ref 39–117)
ALT: 37 IU/L (ref 0–44)
AST: 21 IU/L (ref 0–40)
Albumin/Globulin Ratio: 1.7 (ref 1.2–2.2)
BUN / CREAT RATIO: 18 (ref 9–20)
BUN: 17 mg/dL (ref 6–24)
Bilirubin Total: 1.2 mg/dL (ref 0.0–1.2)
CALCIUM: 9.6 mg/dL (ref 8.7–10.2)
CO2: 26 mmol/L (ref 20–29)
CREATININE: 0.94 mg/dL (ref 0.76–1.27)
Chloride: 98 mmol/L (ref 96–106)
GFR calc Af Amer: 111 mL/min/{1.73_m2} (ref >59)
GFR, EST NON AFRICAN AMERICAN: 96 mL/min/{1.73_m2} (ref >59)
GLOBULIN, TOTAL: 2.5 g/dL (ref 1.5–4.5)
GLUCOSE: 182 mg/dL — AB (ref 65–99)
Potassium: 3.7 mmol/L (ref 3.5–5.2)
SODIUM: 142 mmol/L (ref 134–144)
TOTAL PROTEIN: 6.7 g/dL (ref 6.0–8.5)

## 2018-12-09 LAB — MICROALBUMIN / CREATININE URINE RATIO
CREATININE, UR: 165.4 mg/dL
MICROALBUM., U, RANDOM: 544.1 ug/mL
Microalb/Creat Ratio: 329 mg/g creat — ABNORMAL HIGH (ref 0–29)

## 2018-12-09 LAB — CBC
HEMATOCRIT: 47.2 % (ref 37.5–51.0)
Hemoglobin: 15.7 g/dL (ref 13.0–17.7)
MCH: 29.8 pg (ref 26.6–33.0)
MCHC: 33.3 g/dL (ref 31.5–35.7)
MCV: 90 fL (ref 79–97)
PLATELETS: 135 10*3/uL — AB (ref 150–450)
RBC: 5.27 x10E6/uL (ref 4.14–5.80)
RDW: 12.6 % (ref 11.6–15.4)
WBC: 12.8 10*3/uL — AB (ref 3.4–10.8)

## 2018-12-09 LAB — URINALYSIS
BILIRUBIN UA: NEGATIVE
GLUCOSE, UA: NEGATIVE
Ketones, UA: NEGATIVE
LEUKOCYTES UA: NEGATIVE
Nitrite, UA: NEGATIVE
RBC, UA: NEGATIVE
Specific Gravity, UA: 1.024 (ref 1.005–1.030)
Urobilinogen, Ur: 0.2 mg/dL (ref 0.2–1.0)
pH, UA: 5 (ref 5.0–7.5)

## 2018-12-09 LAB — HEMOGLOBIN A1C
ESTIMATED AVERAGE GLUCOSE: 240 mg/dL
Hgb A1c MFr Bld: 10 % — ABNORMAL HIGH (ref 4.8–5.6)

## 2018-12-09 LAB — T4: T4, Total: 11.2 ug/dL (ref 4.5–12.0)

## 2018-12-09 LAB — TSH: TSH: 0.627 u[IU]/mL (ref 0.450–4.500)

## 2018-12-15 ENCOUNTER — Other Ambulatory Visit: Payer: Self-pay | Admitting: Internal Medicine

## 2018-12-15 DIAGNOSIS — J0100 Acute maxillary sinusitis, unspecified: Secondary | ICD-10-CM

## 2018-12-23 ENCOUNTER — Ambulatory Visit: Payer: Self-pay | Admitting: Internal Medicine

## 2018-12-23 ENCOUNTER — Encounter: Payer: Self-pay | Admitting: Internal Medicine

## 2018-12-23 VITALS — BP 144/88 | HR 75 | Temp 98.0°F | Wt 267.3 lb

## 2018-12-23 DIAGNOSIS — Z9889 Other specified postprocedural states: Secondary | ICD-10-CM

## 2018-12-23 DIAGNOSIS — E890 Postprocedural hypothyroidism: Secondary | ICD-10-CM

## 2018-12-23 DIAGNOSIS — E785 Hyperlipidemia, unspecified: Secondary | ICD-10-CM

## 2018-12-23 DIAGNOSIS — E1169 Type 2 diabetes mellitus with other specified complication: Secondary | ICD-10-CM

## 2018-12-23 DIAGNOSIS — I10 Essential (primary) hypertension: Secondary | ICD-10-CM

## 2018-12-23 DIAGNOSIS — E119 Type 2 diabetes mellitus without complications: Secondary | ICD-10-CM

## 2018-12-23 DIAGNOSIS — J3089 Other allergic rhinitis: Secondary | ICD-10-CM

## 2018-12-23 MED ORDER — FLUTICASONE PROPIONATE 50 MCG/ACT NA SUSP: NASAL | 0 refills | Status: DC

## 2018-12-23 MED ORDER — LISINOPRIL 40 MG PO TABS: ORAL_TABLET | ORAL | 3 refills | Status: DC

## 2018-12-23 MED ORDER — LEVOTHYROXINE SODIUM 75 MCG PO TABS: 75.0000 ug | ORAL_TABLET | Freq: Every day | ORAL | 3 refills | Status: DC

## 2018-12-23 MED ORDER — PEN NEEDLES 32G X 5 MM MISC: 1.0000 | Freq: Every day | 5 refills | Status: AC

## 2018-12-23 MED ORDER — ATORVASTATIN CALCIUM 20 MG PO TABS: 20.0000 mg | ORAL_TABLET | Freq: Every day | ORAL | 3 refills | Status: DC

## 2018-12-23 MED ORDER — HYDROCHLOROTHIAZIDE 25 MG PO TABS: 25.0000 mg | ORAL_TABLET | Freq: Every day | ORAL | 3 refills | Status: DC

## 2018-12-23 MED ORDER — INSULIN GLARGINE 100 UNIT/ML SOLOSTAR PEN: PEN_INJECTOR | SUBCUTANEOUS | 11 refills | Status: DC

## 2018-12-23 MED ORDER — LEVOTHYROXINE SODIUM 200 MCG PO TABS: ORAL_TABLET | ORAL | 3 refills | Status: DC

## 2018-12-23 MED ORDER — CETIRIZINE HCL 10 MG PO TABS: 10.0000 mg | ORAL_TABLET | Freq: Every day | ORAL | 3 refills | Status: DC

## 2018-12-23 NOTE — Progress Notes (Signed)
Subjective:    Patient ID: Richard Riley., male    DOB: 08-19-1971, 49 y.o.   MRN: [id number]  HPI  Patient is a 48 year old male that presents for follow up appointment. No new medical concerns. Patient's main concern is medication refills.   Chief Complaint  Patient presents with  . Follow-up      Review of Systems   Patient Active Problem List   Diagnosis Date Noted  . Hyperlipidemia associated with type 2 diabetes mellitus ([place]) 09/24/2018  . Medication monitoring encounter 02/11/2017  . Tobacco abuse 02/11/2017  . Essential hypertension, benign 02/11/2017  . S/P thyroidectomy 01/23/2017  . Type 2 diabetes mellitus not at goal [facility]) 10/22/2016   Allergies as of 12/23/2018   No Known Allergies     Medication List       Accurate as of December 23, 2018  9:17 AM. Always use your most recent med list.        atorvastatin 20 MG tablet Commonly known as:  LIPITOR Take 1 tablet (20 mg total) by mouth daily.   cetirizine 10 MG tablet Commonly known as:  ZYRTEC Take 10 mg by mouth daily.   cetirizine 10 MG tablet Commonly known as:  ZYRTEC TAKE ONE TABLET BY MOUTH EVERY DAY   fluticasone 50 MCG/ACT nasal spray Commonly known as:  FLONASE PLACE 1 SPRAY INTO BOTH NOSTRILS 2 TIMES A DAY   hydrochlorothiazide 25 MG tablet Commonly known as:  HYDRODIURIL TAKE ONE TABLET BY MOUTH EVERY DAY   Insulin Glargine 100 UNIT/ML Solostar Pen Commonly known as:  LANTUS SOLOSTAR Start with 20 units each evening. Increase dose by 2 units every three days if morning blood sugar over 130, as directed.   levothyroxine 75 MCG tablet Commonly known as:  SYNTHROID, LEVOTHROID Take 1 tablet (75 mcg total) by mouth daily before breakfast.   levothyroxine 200 MCG tablet Commonly known as:  SYNTHROID, LEVOTHROID TAKE ONE TABLET BY MOUTH EVERY DAY BEFORE BREAKFAST   lisinopril 40 MG tablet Commonly known as:  PRINIVIL,ZESTRIL TAKE ONE TABLET (40 MG) BY MOUTH EVERY DAY     metFORMIN 1000 MG tablet Commonly known as:  GLUCOPHAGE TAKE ONE TABLET BY MOUTH 2 TIMES A DAY WITH A MEAL.   metoprolol tartrate 50 MG tablet Commonly known as:  LOPRESSOR TAKE ONE TABLET BY MOUTH 2 TIMES A DAY   omeprazole 20 MG capsule Commonly known as:  PRILOSEC TAKE ONE CAPSULE BY MOUTH AT BEDTIME   Pen Needles 32G X 5 MM Misc 1 each by Does not apply route daily.   sitaGLIPtin 100 MG tablet Commonly known as:  JANUVIA Take 1 tablet (100 mg total) by mouth daily.          Objective:   Physical Exam  BP (!) 144/88   Pulse 75   Temp 98 F (36.7 C) (Oral)   Wt 267 lb 4.8 oz (121.2 kg)   BMI 36.25 kg/m       Assessment & Plan:   1. Type 2 diabetes mellitus not at goal [facility]) Slightly improved. Increasing Insulin Glargine baseline to 22 units plus sliding scale.   Order Today: - Insulin Glargine (LANTUS SOLOSTAR) 100 UNIT/ML Solostar Pen; Start with 22 units each evening. Increase dose by 2 units every three days if morning blood sugar over 130, as directed.  Dispense: 15 mL; Refill: 11 - Insulin Pen Needle (PEN NEEDLES) 32G X 5 MM MISC; 1 each by Does not apply route daily.  Dispense: 50  each; Refill: 5  Check in 3 Months: - Comprehensive metabolic panel; Future - Hemoglobin A1c; Future  2. S/P thyroidectomy Pt's TSH has stabilized since last visit. Increasing Levothyroxine from 225 mcg  daily to 275 mcg daily seems effective.  Refill Today:  - levothyroxine (SYNTHROID, LEVOTHROID) 200 MCG tablet; TAKE ONE TABLET BY MOUTH EVERY DAY BEFORE BREAKFAST  Dispense: 90 tablet; Refill: 3 - levothyroxine (SYNTHROID, LEVOTHROID) 75 MCG tablet; Take 1 tablet (75 mcg total) by mouth daily before breakfast.  Dispense: 90 tablet; Refill: 3  Check in 3 Months: - T4 AND TSH; Future  3. Essential hypertension, benign Controlled.  Refill Today:  - hydrochlorothiazide (HYDRODIURIL) 25 MG tablet; Take 1 tablet (25 mg total) by mouth daily.  Dispense: 90 tablet; Refill:  3 - lisinopril (PRINIVIL,ZESTRIL) 40 MG tablet; TAKE ONE TABLET (40 MG) BY MOUTH EVERY DAY  Dispense: 90 tablet; Refill: 3  4. Hyperlipidemia associated with type 2 diabetes mellitus ([place])  Refill Today:  - atorvastatin (LIPITOR) 20 MG tablet; Take 1 tablet (20 mg total) by mouth daily.  Dispense: 90 tablet; Refill: 3  5. Environmental and seasonal allergies Managed.   Refill Today:  - cetirizine (ZYRTEC) 10 MG tablet; Take 1 tablet (10 mg total) by mouth daily.  Dispense: 90 tablet; Refill: 3 - fluticasone (FLONASE) 50 MCG/ACT nasal spray; PLACE 1 SPRAY INTO BOTH NOSTRILS 2 TIMES A DAY  Dispense: 16 g; Refill: 0        RTE in 3 months

## 2019-01-11 ENCOUNTER — Telehealth: Payer: Self-pay | Admitting: Pharmacist

## 2019-01-11 NOTE — Telephone Encounter (Signed)
01/11/2019 11:42:19 AM - Richard Riley refill  01/11/2019 Called [employer] to refill Januvia 100mg .[name]

## 2019-02-03 ENCOUNTER — Telehealth: Payer: Self-pay | Admitting: Pharmacy Technician

## 2019-02-03 NOTE — Telephone Encounter (Signed)
Received 2020 proof of income.  Patient eligible to receive medication assistance at [facility] as long as eligibility requirements continue to be met.  [facility]

## 2019-02-19 ENCOUNTER — Telehealth: Payer: Self-pay | Admitting: Pharmacist

## 2019-02-19 NOTE — Telephone Encounter (Signed)
02/19/2019 9:36:03 AM - Lantus Solostar pending  02/19/2019 I have received the signed portion of [employer] application for Lantus Solostar from provider-I am holding for patient to return his portion mailed to him 02/09/2019.Richard Riley

## 2019-02-24 ENCOUNTER — Telehealth: Payer: Self-pay | Admitting: Pharmacist

## 2019-02-24 NOTE — Telephone Encounter (Signed)
02/24/2019 1:55:42 PM - Lantus Solostar renewal  02/24/2019 Faxed [employer] application for renewal of Lantus Solostar Inject 20 units each evening, increase by 2 units every three days if AM BS is > 130. Max daily dose 30 units. [name]

## 2019-02-26 ENCOUNTER — Telehealth: Payer: Self-pay | Admitting: Pharmacist

## 2019-02-26 NOTE — Telephone Encounter (Signed)
02/26/2019 12:15:28 PM - [name] approval Lantus Solostar  02/26/2019 Received letter from [name] stating patient approved till 02/25/2020 for Lantus Solostar.Richard Riley

## 2019-03-11 ENCOUNTER — Other Ambulatory Visit: Payer: Self-pay | Admitting: Internal Medicine

## 2019-03-11 ENCOUNTER — Other Ambulatory Visit: Payer: Self-pay

## 2019-03-11 DIAGNOSIS — E119 Type 2 diabetes mellitus without complications: Secondary | ICD-10-CM

## 2019-03-11 DIAGNOSIS — J3089 Other allergic rhinitis: Secondary | ICD-10-CM

## 2019-03-11 DIAGNOSIS — E0590 Thyrotoxicosis, unspecified without thyrotoxic crisis or storm: Secondary | ICD-10-CM

## 2019-03-11 MED ORDER — METOPROLOL TARTRATE 50 MG PO TABS: ORAL_TABLET | ORAL | 0 refills | Status: DC

## 2019-03-11 MED ORDER — METFORMIN HCL 1000 MG PO TABS: ORAL_TABLET | ORAL | 0 refills | Status: DC

## 2019-03-11 MED ORDER — OMEPRAZOLE 20 MG PO CPDR: 20.0000 mg | DELAYED_RELEASE_CAPSULE | Freq: Every day | ORAL | 0 refills | Status: DC

## 2019-03-16 ENCOUNTER — Other Ambulatory Visit: Payer: Self-pay

## 2019-03-17 ENCOUNTER — Other Ambulatory Visit: Payer: Self-pay

## 2019-03-17 DIAGNOSIS — E119 Type 2 diabetes mellitus without complications: Secondary | ICD-10-CM

## 2019-03-17 DIAGNOSIS — Z9889 Other specified postprocedural states: Secondary | ICD-10-CM

## 2019-03-17 DIAGNOSIS — E890 Postprocedural hypothyroidism: Secondary | ICD-10-CM

## 2019-03-18 LAB — COMPREHENSIVE METABOLIC PANEL
ALT: 41 IU/L (ref 0–44)
AST: 21 IU/L (ref 0–40)
Albumin/Globulin Ratio: 1.8 (ref 1.2–2.2)
Albumin: 4.5 g/dL (ref 4.0–5.0)
Alkaline Phosphatase: 68 IU/L (ref 39–117)
BUN/Creatinine Ratio: 19 (ref 9–20)
BUN: 17 mg/dL (ref 6–24)
Bilirubin Total: 1.7 mg/dL — ABNORMAL HIGH (ref 0.0–1.2)
CO2: 27 mmol/L (ref 20–29)
Calcium: 9.8 mg/dL (ref 8.7–10.2)
Chloride: 95 mmol/L — ABNORMAL LOW (ref 96–106)
Creatinine, Ser: 0.91 mg/dL (ref 0.76–1.27)
GFR calc Af Amer: 116 mL/min/{1.73_m2} (ref >59)
GFR calc non Af Amer: 100 mL/min/{1.73_m2} (ref >59)
Globulin, Total: 2.5 g/dL (ref 1.5–4.5)
Glucose: 233 mg/dL — ABNORMAL HIGH (ref 65–99)
Potassium: 4 mmol/L (ref 3.5–5.2)
Sodium: 138 mmol/L (ref 134–144)
Total Protein: 7 g/dL (ref 6.0–8.5)

## 2019-03-18 LAB — HEMOGLOBIN A1C
Est. average glucose Bld gHb Est-mCnc: 214 mg/dL
Hgb A1c MFr Bld: 9.1 % — ABNORMAL HIGH (ref 4.8–5.6)

## 2019-03-18 LAB — T4 AND TSH
T4, Total: 12.1 ug/dL — ABNORMAL HIGH (ref 4.5–12.0)
TSH: 0.919 u[IU]/mL (ref 0.450–4.500)

## 2019-03-24 ENCOUNTER — Ambulatory Visit: Payer: Self-pay | Admitting: Internal Medicine

## 2019-03-24 DIAGNOSIS — R809 Proteinuria, unspecified: Secondary | ICD-10-CM

## 2019-03-24 DIAGNOSIS — E890 Postprocedural hypothyroidism: Secondary | ICD-10-CM

## 2019-03-24 DIAGNOSIS — Z9889 Other specified postprocedural states: Secondary | ICD-10-CM

## 2019-03-24 DIAGNOSIS — E119 Type 2 diabetes mellitus without complications: Secondary | ICD-10-CM

## 2019-03-24 NOTE — Progress Notes (Signed)
Subjective:    Patient ID: Richard Riley., male    DOB: May 06, 1971, 48 y.o.   MRN: [id number]  HPI  Patient is a 48 year old male who presents for a telephonic visit. Patient complains of head pain/ "funny feeling in head" that remains all throughout the day. It doesn't feel like a headache pain, it's different. Pain medication doesn't help. Pt states that he had a lot of head injuries as a child and young adult. Hx of playing football.   There is a bump on the top of his head. Pt claims that it is round as a quarter, half an inch high on top of head towards the back. Pt doesn't think it has pus in it.   Patient claims he has been restless in the evenings and gets up to urinate many times. Pt's sugars have been fluctuating, he has had readings of 160 all the way up to the 400's. Pt states that he has to adjust insulin from 22 units to 30 units once or twice a week based on his sugar readings. Tries to keep sugar between 100-130.    Patient has not been checking blood pressure at home.    Review of Systems  Patient Active Problem List   Diagnosis Date Noted  . Hyperlipidemia associated with type 2 diabetes mellitus ([place]) 09/24/2018  . Medication monitoring encounter 02/11/2017  . Tobacco abuse 02/11/2017  . Essential hypertension, benign 02/11/2017  . S/P thyroidectomy 01/23/2017  . Type 2 diabetes mellitus not at goal [facility]) 10/22/2016   Allergies as of 03/24/2019   No Known Allergies     Medication List       Accurate as of Mar 24, 2019  9:57 AM. If you have any questions, ask your nurse or doctor.        atorvastatin 20 MG tablet Commonly known as:  LIPITOR Take 1 tablet (20 mg total) by mouth daily.   cetirizine 10 MG tablet Commonly known as:  ZYRTEC Take 1 tablet (10 mg total) by mouth daily.   fluticasone 50 MCG/ACT nasal spray Commonly known as:  FLONASE PLACE 1 SPRAY INTO BOTH NOSTRILS 2 TIMES A DAY   hydrochlorothiazide 25 MG tablet Commonly known as:   HYDRODIURIL Take 1 tablet (25 mg total) by mouth daily.   Insulin Glargine 100 UNIT/ML Solostar Pen Commonly known as:  Lantus SoloStar Start with 22 units each evening. Increase dose by 2 units every three days if morning blood sugar over 130, as directed.   levothyroxine 200 MCG tablet Commonly known as:  SYNTHROID TAKE ONE TABLET BY MOUTH EVERY DAY BEFORE BREAKFAST   levothyroxine 75 MCG tablet Commonly known as:  SYNTHROID Take 1 tablet (75 mcg total) by mouth daily before breakfast.   lisinopril 40 MG tablet Commonly known as:  ZESTRIL TAKE ONE TABLET (40 MG) BY MOUTH EVERY DAY   metFORMIN 1000 MG tablet Commonly known as:  GLUCOPHAGE TAKE ONE TABLET BY MOUTH 2 TIMES A DAY WITH A MEAL.   metoprolol tartrate 50 MG tablet Commonly known as:  LOPRESSOR TAKE ONE TABLET BY MOUTH 2 TIMES A DAY   omeprazole 20 MG capsule Commonly known as:  PRILOSEC TAKE ONE CAPSULE BY MOUTH AT BEDTIME   Pen Needles 32G X 5 MM Misc 1 each by Does not apply route daily.   sitaGLIPtin 100 MG tablet Commonly known as:  JANUVIA Take 1 tablet (100 mg total) by mouth daily.  Objective:   Physical Exam  Telephonic Visit      Assessment & Plan:   1. Type 2 diabetes mellitus not at goal [facility]) Some improvement of A1C: went down from 10.0 (12/2018) to 9.1 (03/2019). According to patient's at home FSBS readings, sugars remain unstable. Patient encouraged to continue to closely monitor FSBS to try to stabilize sugars.  Check in 3 Months.  - Hemoglobin A1c; Future - Comprehensive metabolic panel; Future - CBC; Future - Sedimentation rate; Future - UA/M w/rflx Culture, Routine; Future  2. S/P thyroidectomy TSH is normal. Since thyroidectomy patient has remained stable.   Check in 3 Months:  - T4 AND TSH; Future  3. Proteinuria, unspecified type Check Next Week:  - Protein Electrophoresis, Urine Rflx.

## 2019-03-31 ENCOUNTER — Other Ambulatory Visit: Payer: Self-pay

## 2019-03-31 DIAGNOSIS — E119 Type 2 diabetes mellitus without complications: Secondary | ICD-10-CM

## 2019-04-06 LAB — PROTEIN ELECTROPHORESIS, URINE REFLEX
Albumin ELP, Urine: 7 %
Alpha-1-Globulin, U: 0.7 %
Alpha-2-Globulin, U: 4 %
Beta Globulin, U: 10.1 %
Gamma Globulin, U: 3 %
Protein, Ur: 88.6 mg/dL

## 2019-04-07 ENCOUNTER — Other Ambulatory Visit: Payer: Self-pay

## 2019-04-07 ENCOUNTER — Ambulatory Visit: Payer: Self-pay | Admitting: Internal Medicine

## 2019-04-07 DIAGNOSIS — E1165 Type 2 diabetes mellitus with hyperglycemia: Secondary | ICD-10-CM

## 2019-04-07 NOTE — Progress Notes (Signed)
   Subjective:    Patient ID: Richard Riley., male    DOB: Mar 27, 1971, 48 y.o.   MRN: [id number]  HPI Pt's labs look healthy. No abnormality in the urine. No new problems. Pt is having a foggy mind problem. Pt is having high sugars. The highest has been around 280 and pt has changed his meds to 30-40 units each time. Pt is having trouble walking.   Patient Active Problem List   Diagnosis Date Noted  . Hyperlipidemia associated with type 2 diabetes mellitus ([place]) 09/24/2018  . Medication monitoring encounter 02/11/2017  . Tobacco abuse 02/11/2017  . Essential hypertension, benign 02/11/2017  . S/P thyroidectomy 01/23/2017  . Type 2 diabetes mellitus not at goal [facility]) 10/22/2016   Allergies as of 04/07/2019   No Known Allergies     Medication List       Accurate as of April 07, 2019 10:15 AM. If you have any questions, ask your nurse or doctor.        atorvastatin 20 MG tablet Commonly known as:  LIPITOR Take 1 tablet (20 mg total) by mouth daily.   cetirizine 10 MG tablet Commonly known as:  ZYRTEC Take 1 tablet (10 mg total) by mouth daily.   fluticasone 50 MCG/ACT nasal spray Commonly known as:  FLONASE PLACE 1 SPRAY INTO BOTH NOSTRILS 2 TIMES A DAY   hydrochlorothiazide 25 MG tablet Commonly known as:  HYDRODIURIL Take 1 tablet (25 mg total) by mouth daily.   Insulin Glargine 100 UNIT/ML Solostar Pen Commonly known as:  Lantus SoloStar Start with 22 units each evening. Increase dose by 2 units every three days if morning blood sugar over 130, as directed.   levothyroxine 200 MCG tablet Commonly known as:  SYNTHROID TAKE ONE TABLET BY MOUTH EVERY DAY BEFORE BREAKFAST   levothyroxine 75 MCG tablet Commonly known as:  SYNTHROID Take 1 tablet (75 mcg total) by mouth daily before breakfast.   lisinopril 40 MG tablet Commonly known as:  ZESTRIL TAKE ONE TABLET (40 MG) BY MOUTH EVERY DAY   metFORMIN 1000 MG tablet Commonly known as:  GLUCOPHAGE TAKE ONE TABLET BY  MOUTH 2 TIMES A DAY WITH A MEAL.   metoprolol tartrate 50 MG tablet Commonly known as:  LOPRESSOR TAKE ONE TABLET BY MOUTH 2 TIMES A DAY   omeprazole 20 MG capsule Commonly known as:  PRILOSEC TAKE ONE CAPSULE BY MOUTH AT BEDTIME   Pen Needles 32G X 5 MM Misc 1 each by Does not apply route daily.   sitaGLIPtin 100 MG tablet Commonly known as:  JANUVIA Take 1 tablet (100 mg total) by mouth daily.         Review of Systems    deferred  Objective:   Physical Exam  deferred      Assessment & Plan:  Pt needs to check his sugars BID before breakfast and dinner for a few weeks. PT needs to do some walking everyday at least 10 min BID. Pt needs to keep a record. RTC in 6 weeks for labs-cbc,tsh, a1c, and sed rate.

## 2019-04-08 ENCOUNTER — Ambulatory Visit: Payer: Self-pay | Admitting: Licensed Clinical Social Worker

## 2019-04-08 ENCOUNTER — Other Ambulatory Visit: Payer: Self-pay

## 2019-04-08 DIAGNOSIS — F332 Major depressive disorder, recurrent severe without psychotic features: Secondary | ICD-10-CM

## 2019-04-08 DIAGNOSIS — F411 Generalized anxiety disorder: Secondary | ICD-10-CM

## 2019-04-08 NOTE — BH Specialist Note (Signed)
Integrated Behavioral Health Comprehensive Clinical Assessment Via Phone  MRM: [id number] Name: Mohsen Odenthal.  Type of Service: Integrated Behavioral Health-Individual Interpretor: No. Interpretor Name and Language: Not applicable.  PRESENTING CONCERNS: Glennie Rodda. is a 48 y.o. male accompanied by himself.[name]. was referred by Dr. Mable Fill MD to [facility] clinician for mental health.  Previous mental health services Have you ever been treated for a mental health problem? No If "Yes", when were you treated and whom did you see? BN/A. Have you ever been hospitalized for mental health treatment? No Have you ever been treated for any of the following? Past Psychiatric History/Hospitalization(S): Anxiety: Yes Mr. Seals reports that he has been experiencing anxiety his entire life. He explains that he worries about everything. He describes feeling so overwhelmed that he will have crying spells and episodes of irritability. His symptoms include: feeling nervous, anxious, or on edge nearly everyday, not being able to stop or control worrying, worrying too much about different things, trouble relaxing, being so restless its hard to sit still, becoming easily annoyed or irritable, and feeling afraid as if something awful might happen.  Bipolar Disorder: Negative Depression: Yes Mr. Whittley reports that he started experiencing symptoms of depression in the last three years and it worsened in the past year. He describes feeling down and depressed nearly everyday, difficulty staying asleep, fatigue, poor appetite, feeling bad about himself, difficulty concentrating, and restlessness. He reports that he has suicidal thoughts without intent. He reports that he would either hang himself or shoot himself. He admitted that their was access to a fire arm in the home but his father is the only person with a key. He reports that what stops him from acting on his thoughts is his family and  belief in christ.  Mania: Negative Psychosis: Negative Schizophrenia: Negative Personality Disorder: Negative Hospitalization for psychiatric illness: Negative History of Electroconvulsive Shock Therapy: Negative Prior Suicide Attempts: No Have you ever had thoughts of harming yourself or others or attempted suicide? See above.   Medical history  has a past medical history of Heart murmur, Hypertension, PONV (postoperative nausea and vomiting), Sleep apnea, Thyroid disease, Tobacco abuse (02/11/2017), and Type 2 diabetes mellitus not at goal [facility]) (10/22/2016). Primary Care Physician: Tawni Millers, MD Date of last physical exam: 04/07/2019 Allergies: No Known Allergies Current medications:  Outpatient Encounter Medications as of 04/08/2019  Medication Sig  . atorvastatin (LIPITOR) 20 MG tablet Take 1 tablet (20 mg total) by mouth daily.  . cetirizine (ZYRTEC) 10 MG tablet Take 1 tablet (10 mg total) by mouth daily.  . fluticasone (FLONASE) 50 MCG/ACT nasal spray PLACE 1 SPRAY INTO BOTH NOSTRILS 2 TIMES A DAY  . hydrochlorothiazide (HYDRODIURIL) 25 MG tablet Take 1 tablet (25 mg total) by mouth daily.  . Insulin Glargine (LANTUS SOLOSTAR) 100 UNIT/ML Solostar Pen Start with 22 units each evening. Increase dose by 2 units every three days if morning blood sugar over 130, as directed.  . Insulin Pen Needle (PEN NEEDLES) 32G X 5 MM MISC 1 each by Does not apply route daily.  Marland Kitchen levothyroxine (SYNTHROID, LEVOTHROID) 200 MCG tablet TAKE ONE TABLET BY MOUTH EVERY DAY BEFORE BREAKFAST  . levothyroxine (SYNTHROID, LEVOTHROID) 75 MCG tablet Take 1 tablet (75 mcg total) by mouth daily before breakfast.  . lisinopril (PRINIVIL,ZESTRIL) 40 MG tablet TAKE ONE TABLET (40 MG) BY MOUTH EVERY DAY  . metFORMIN (GLUCOPHAGE) 1000 MG tablet TAKE ONE TABLET BY MOUTH 2 TIMES A DAY  WITH A MEAL.  . metoprolol tartrate (LOPRESSOR) 50 MG tablet TAKE ONE TABLET BY MOUTH 2 TIMES A DAY  . omeprazole (PRILOSEC) 20 MG  capsule TAKE ONE CAPSULE BY MOUTH AT BEDTIME  . sitaGLIPtin (JANUVIA) 100 MG tablet Take 1 tablet (100 mg total) by mouth daily.   No facility-administered encounter medications on file as of 04/08/2019.    Have you ever had any serious medication reactions? Yes- methimazole Is there any history of mental health problems or substance abuse in your family? Yes- Mr. Glace reports that his aunt who was not blood related took her own life when he was a child but doesn't really know the details. He notes that alcoholism and drug addiction runs on the paternal side of his family. He reports that his father was an alcoholic and used to take him to bars with him until he turned 38. He reports that his several aunts and uncles on the dad's side of the family were alcoholics and drug addicts.  Has anyone in your family been hospitalized for mental health treatment? No  Social/family history Who lives in your current household? Mr. Chauvin lives with his mom and dad. He is unemployed. He has thought about applying for disability but has yet to do so.  What is your family of origin, childhood history? Mr. Morten was born in [place], [place]. Where were you born? See below. Where did you grow up?Mr. Molony grew up in [place], [place].  How many different homes have you lived in? A few.  Describe your childhood: Mr. Kley describes his childhood as being good for the most part. He reports that his dad used to drink a lot when he was growing up, took him around to different bars, and he was introduced to alcohol at an early age.  Do you have siblings, step/half siblings? Yes- Mr. Apuzzo has one brother who is 53 years younger than he is.  What are their names, relation, sex, age? See above. Are your parents separated or divorced? No What are your social supports? Mr. Geoghegan has the support of his parents.  He has three children ages 42, 3, and 33. He has been married twice. His first marriage last five years and had his  first and second child with his first wife. He reports that his first marriage didn't work out because he got married too young. His second marriage last 33 years and had his youngest child with his second wife. He explains that his second marriage ended because his wife lied to him a lot and became an addict when they separated.  Education How many grades have you completed? 12th grade Did you have any problems in school? No  Employment/financial issues Mr. Deutscher is unemployed and hasn't worked in the last three years due to health problems. He owes child support but is unable to pay it. He was provided with information for applying for disability.   Sleep Usual bedtime varies Sleeping arrangements: alone. Problems with snoring: No Obstructive sleep apnea is not a concern. Problems with nightmares: No Problems with night terrors: No Problems with sleepwalking: No  Trauma/Abuse history Have you ever experienced or been exposed to any form of abuse? No Have you ever experienced or been exposed to something traumatic? No  Substance use Do you use alcohol, nicotine or caffeine? Mr. Solis reports that he used to drink beer until he passed out and last use was under a year ago.  How old were you when  you first tasted alcohol? Teens. Have you ever used illicit drugs or abused prescription medications? Mr. Hubbert denies abusing drugs. He has not previously been in substance abuse treatment.   Mental status General appearance/Behavior:  Eye contact: Absent Motor behavior: unable to assess due to phone visit.  Speech: Normal Level of consciousness: Alert Mood: Depressed Affect: Appropriate Anxiety level: Moderate Thought process: Coherent Thought content: WNL Perception: Normal Judgment: Fair Insight: Present  Diagnosis No diagnosis found.  GOALS ADDRESSED: Patient will reduce symptoms of: anxiety, depression and insomnia and increase knowledge and/or ability of: coping skills,  healthy habits, self-management skills and stress reduction and also: Increase healthy adjustment to current life circumstances              INTERVENTIONS: Interventions utilized: Psychoeducation and/or Health Education Standardized Assessments completed: GAD-7 and PHQ 9   ASSESSMENT/OUTCOME:  Taylen Osorto is a 48 year old Caucasian male who presents via phone for a mental health assessment and was referred by Dr. Mable Fill, MD. Mr. Achord reports that he has been experiencing symptoms of anxiety all of his life. He reports that his symptoms of depression started about about three years ago when he was no longer able to work and worsened in the last year. He has not previously been hospitalized for mental illness or substance abuse. He has not previously been prescribed psychotropic medications for mental illness. He had had three DWI's due to alcohol abuse and last DWI was about 7 years ago. He reports that his last drink of alcohol was a little less than a year ago.   Mr. Kristiansen is an established patient at [facility] [place], [place]. He has a history of hypertension, Type II diabetes, and hyperlipidemia. He had a thyroidectomy in March of 2018. He is a former smoker. He reported that he had experienced an adverse drug reaction to methimazole in the past due to causing pain in his eyes. He reports that he has seasonal allergies.   Mr. Lizer is unemployed. He lives with his parents. He has a younger brother, 33 year age gap between them. He has three children; 27, 10, and 14. He has been married twice. His first marriage lasted for five years and has his first two children with his first wife. His second marriage lasted 41 years and had his youngest daughter with his second wife. He has been arrested in the past for three DWI's (last one seven years ago) and for back child support. Per the patient he is unable to work due to chronic pain, difficulty walking, and blood sugar has been high.    PLAN: Case consultation with Dr. Octavia Heir, MD, Psychiatric Consultant to start Mr. Bromwell on Mirtazapine 15 mg at bedtime for insomnia, depression, and anxiety. It tolerated, Mirtazapine will be increase from 15 mg to 30 mg at bedtime. Discuss with the patient the potential side effects, risks, and benefits of the medications.  Recommendation to contain release of information for the patient's parents to discuss concern for patient's suicidal ideation and plan without intent. Discuss the limits of Confidentiality. Provide DBT psychotherapy at least once to twice a week focusing on a list of reasons to live and reviewing it daily. Safety plan if suicidal thoughts worsen with intent and plan to go to the call her at the clinic, mobile crisis, or go to the nearest emergency room. If patient refuses, due to limits of confidentiality when a patient is a harm to himself or others, that the police  will be called to have him involuntarily committed to the hospital.   Scheduled next visit: Follow up in one week or earlier on Thursday June 11th @ 4 pm.  [employer]

## 2019-04-13 ENCOUNTER — Encounter: Payer: Self-pay | Admitting: Licensed Clinical Social Worker

## 2019-04-15 ENCOUNTER — Other Ambulatory Visit: Payer: Self-pay

## 2019-04-15 ENCOUNTER — Ambulatory Visit: Payer: Self-pay | Admitting: Licensed Clinical Social Worker

## 2019-04-15 DIAGNOSIS — F332 Major depressive disorder, recurrent severe without psychotic features: Secondary | ICD-10-CM

## 2019-04-15 DIAGNOSIS — F411 Generalized anxiety disorder: Secondary | ICD-10-CM

## 2019-04-15 NOTE — BH Specialist Note (Signed)
Integrated Behavioral Health Follow Up Visit Via Phone  MRN: [id number] Name: Richard Riley.  Number of [place] Clinician visits: 1/6 Session Start time: 4:00 pm  Session End time: 4:50 pm Total time: 50 minutes  Type of Service: [place] Interpretor:No. Interpretor Name and Language: Not applicable.  SUBJECTIVE: Richard Riley. is a 48 y.o. male accompanied by himself. Patient was referred by Andrey Farmer, MD for mental health. Patient reports the following symptoms/concerns: He reports that he hasn't heard of Mirtazapine but is willing to take it and hopes he feels better. He reports that he is willing to fill out a consent form and give verbal consent for the therapist to speak with his parents. He notes that he has a dog that mostly stays out in the back yard. He reports that his day consists of taking care of his dogs, meals, medications, and watching TV. He notes that he used to play corn hole, pool, and horse shoes until he started having health problems. 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 He explains that he used to go to church with his mom and grandma until they both stopped going. He denies suicidal and homicidal thoughts.  Duration of problem: ; Severity of problem: moderate  OBJECTIVE: Mood: Euthymic and Affect: Appropriate Risk of harm to self or others: No plan to harm self or others  LIFE CONTEXT: Family and Social:see above. School/Work: see above. Self-Care: see above. Life Changes: see above.  GOALS ADDRESSED: Patient will: 1.  Reduce symptoms of: depression and insomnia  2.  Increase knowledge and/or ability of: coping skills, healthy habits, self-management skills and stress reduction  3.  Demonstrate ability to: Increase healthy adjustment to current life circumstances  INTERVENTIONS: Interventions utilized:  DBT was utilizing the clinician focusing on the patient's depression and  coming up with a list of things to live for. Clinician processed with the patient regarding how he has been doing since the last follow up session. Clinician discussed with the patient regarding that the Dr. Octavia Heir, MD, psychiatric consultant wants to start him on Mirtazapine 15 mg at bedtime for anxiety, depression, and insomnia. Clinician explained to the patient that due to the risk of safety, she asked if he would be willing to fill out a consent of release of information to speak with his parents. Clinician asked the patient prior to his health declining, did he have any hobbies or things he did for enjoyment. Clinician suggested that the patient come up with a list of things to look forward to and work on establishing a routine. Clinician explained to the patient that its important that he has a routine for the morning, afternoon, and evening. Clinician explained to the patient that part of his morning routine could be walking for ten to fifteen minutes, hygiene, eating breakfast, and cleaning his room. Clinician explained that his afternoon routine could be taking care of his dog, helping out with a chore around the house, or asking his parents if anything needs to be fix.  Clinician explained to the patient that in the evening that he could eat dinner, wash the dishes, go on another ten to fifteen minute walk, take care of his dog, and watch TV until time for bed. Clinician encouraged the patient to consider attending church again once the pandemic ends.  Standardized Assessments completed: GAD-7 and PHQ 9  ASSESSMENT: Patient currently experiencing see above.   Patient may benefit from see above.  PLAN: 1. Follow up with behavioral health clinician on : one week or earlier if needed. 2. Behavioral recommendations: see above.  3. Referral(s): [place] (In Clinic) 4. "From scale of 1-10, how likely are you to follow plan?":   Bayard Hugger, LCSW

## 2019-04-20 ENCOUNTER — Other Ambulatory Visit: Payer: Self-pay

## 2019-04-20 MED ORDER — MIRTAZAPINE 15 MG PO TABS: 15.0000 mg | ORAL_TABLET | Freq: Every day | ORAL | 0 refills | Status: DC

## 2019-04-20 NOTE — Progress Notes (Signed)
Recommended Richard Riley start this medication at night

## 2019-04-22 ENCOUNTER — Ambulatory Visit: Payer: Self-pay | Admitting: Licensed Clinical Social Worker

## 2019-04-22 ENCOUNTER — Telehealth: Payer: Self-pay | Admitting: Pharmacist

## 2019-04-22 ENCOUNTER — Other Ambulatory Visit: Payer: Self-pay

## 2019-04-22 DIAGNOSIS — F411 Generalized anxiety disorder: Secondary | ICD-10-CM

## 2019-04-22 DIAGNOSIS — F332 Major depressive disorder, recurrent severe without psychotic features: Secondary | ICD-10-CM

## 2019-04-22 NOTE — BH Specialist Note (Signed)
Integrated Behavioral Health Follow Up Visit Via Phone  MRN: [id number] Name: Richard Riley.  Number of [place] Clinician visits: 2/6  Type of Service: [place] Interpretor:No. Interpretor Name and Language: Not applicable.  SUBJECTIVE: Richard Riley. is a 48 y.o. male accompanied by himself. Patient was referred by Andrey Farmer, MD for mental health. Patient reports the following symptoms/concerns: He reports that he has been walking for at least ten minutes twice a day. He reports that he received the disability application information in the mail today. He reports that he just started taking the Mirtazapine last night. He describes having weird dreams this past week and not being able to fall back asleep. He notes that he is not laying in the bed as often and is trying to spend sometime in the living room. He asked about what foods he could eat to lower his blood sugar. He admits to suicidal thoughts denies intent or plan.  Duration of problem: ; Severity of problem: severe  OBJECTIVE: Mood: Euthymic and Affect: Appropriate Risk of harm to self or others: No plan to harm self or others  LIFE CONTEXT: Family and Social: See above. School/Work: See above. Self-Care: See above. Life Changes: See above.   GOALS ADDRESSED: Patient will: 1.  Reduce symptoms of: depression  2.  Increase knowledge and/or ability of: coping skills, healthy habits, self-management skills and stress reduction  3.  Demonstrate ability to: Increase healthy adjustment to current life circumstances  INTERVENTIONS: Interventions utilized:  Brief DBT was utilized by the clinician focusing on the patient's overall health and depression. Clinician processed with the patient regarding how he has been doing since the last follow up session. Clinician asked the patient if he has made any changes in his routine since the last follow up session. Clinician  explained to the patient that it sounds like he is spending less time in his bed and isn't isolating himself away from his family as much. Clinician encouraged the patient to continue walking and to gradually increase his time to fifteen minutes twice a day. Clinician discussed with the patient healthy carbohydrates versus unhealthy carbohydrates. Clinician explained to the patient to avoid foods that have saturated fats, trans fat, high amount of sodium, and that will raise his cholesterol. Clinician encouraged the patient to continue to establish a routine for the morning, afternoon, and evening.  Standardized Assessments completed: GAD-7 and PHQ 9  ASSESSMENT: Patient currently experiencing see above.   Patient may benefit from see above.  PLAN: 1. Follow up with behavioral health clinician on : one week or earlier if needed. 2. Behavioral recommendations: see above.  3. Referral(s): [place] (In Clinic) 4. "From scale of 1-10, how likely are you to follow plan?":   Bayard Hugger, LCSW

## 2019-04-22 NOTE — Telephone Encounter (Signed)
04/22/2019 9:56:05 AM - Richard Riley refill  04/22/2019 Called [facility] for refill, allow 7-10 business days to receive, this is patient's last refill.[name]

## 2019-04-29 ENCOUNTER — Other Ambulatory Visit: Payer: Self-pay

## 2019-04-29 ENCOUNTER — Ambulatory Visit: Payer: Self-pay | Admitting: Licensed Clinical Social Worker

## 2019-04-29 DIAGNOSIS — F332 Major depressive disorder, recurrent severe without psychotic features: Secondary | ICD-10-CM

## 2019-04-29 DIAGNOSIS — F411 Generalized anxiety disorder: Secondary | ICD-10-CM

## 2019-04-29 NOTE — BH Specialist Note (Signed)
Integrated Behavioral Health Follow Up Visit Via Phone  MRN: [id number] Name: Richard Riley.  Number of [place] Clinician visits: 3/6   Type of Service: [place] Interpretor:No. Interpretor Name and Language:   SUBJECTIVE: Richard Mccard. is a 48 y.o. male accompanied by himself. Patient was referred by Andrey Farmer for mental health. Patient reports the following symptoms/concerns: He reports that the Mirtazapine helped him to sleep the majority of the night the second day he took it but hasn't helped since then. He reports that he wakes up every hour or less. He denies that he has ever had a sleep study. He notes that he has been told by several people in the past that he snores and has quit breathing in his sleep. He reports that he has been walking twice a day and sitting outside for thirty minutes a day while his dog runs around. He notes that he is spending less time in lying in his bed during the day. He explains that he hasn't noticed any difference in his symptoms of anxiety and depression since starting the Mirtazapine 15 mg at bedtime. He expressed concern that his blood sugar is running 150 to 191 in the morning about ranging from 127 and above 200 in the evenings. He reports that he will wash dishes, takes care of his dog, and vacuum the floor sometimes. He reports that he has been experiencing pain for awhile now but hasn't mentioned it to Dr. 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 He notes that he spoke to two of his daughters on father's day via text and spoke with his youngest daughter last night, who wants to visit him after COVID 19 is gone. He admits to suicidal thoughts and denies intent with plan. He denies homicidal thoughts.  Duration of problem: ; Severity of problem: severe  OBJECTIVE: Mood: Depressed and Affect: Appropriate Risk of harm to self or others: No plan to harm self or others  LIFE  CONTEXT: Family and Social: See above. School/Work: See above. Self-Care: See above. Life Changes: See above.   GOALS ADDRESSED: Patient will: 1.  Reduce symptoms of: depression  2.  Increase knowledge and/or ability of: coping skills and healthy habits  3.  Demonstrate ability to: Increase healthy adjustment to current life circumstances  INTERVENTIONS: Interventions utilized:  Brief DBT was utilized by the clinician focusing on the patient's depression. Clinician processed with the patient regarding how he has been doing since the last follow up session. Clinician explained to the patient that it takes psychotropic medications at least four to six weeks to fully get into your system. Clinician explained to the patient that if he doesn't notice a difference after its been fully in his system that the dosage of Mirtazapine can be increased to 30 mg at bedtime. Clinician discussed with the patient the running list of things to live for. Clinician encouraged the patient to continue exercising and establishing a routine. Clinician discussed with the patient potential options of things that he could do for enjoyment. Clinician explained to the patient that behavioral activation is a combination of established routine, responsibilities, and things for enjoyment. Clinician encouraged the patient to share with Dr. Mable Fill, his issues with chronic pain.  Standardized Assessments completed: next follow up session.  ASSESSMENT: Patient currently experiencing see above.   Patient may benefit from see above.   PLAN: 1. Follow up with behavioral health clinician on :  2. Behavioral recommendations: Clinician  will relay Dr. Octavia Heir, MD, psychiatric consultant's recommendation to Dr. Mable Fill in regards to referring the patient for a sleep study for sleep apnea due to history of insomnia, poor quality of sleep, and snoring.  3. Referral(s): [place] (In Clinic) 4. "From scale of  1-10, how likely are you to follow plan?":   Bayard Hugger, LCSW

## 2019-05-06 ENCOUNTER — Telehealth: Payer: Self-pay | Admitting: Pharmacist

## 2019-05-06 ENCOUNTER — Ambulatory Visit: Payer: Self-pay | Admitting: Licensed Clinical Social Worker

## 2019-05-06 ENCOUNTER — Other Ambulatory Visit: Payer: Self-pay

## 2019-05-06 DIAGNOSIS — F332 Major depressive disorder, recurrent severe without psychotic features: Secondary | ICD-10-CM

## 2019-05-06 DIAGNOSIS — F411 Generalized anxiety disorder: Secondary | ICD-10-CM

## 2019-05-06 NOTE — Telephone Encounter (Signed)
05/06/2019 10:45:16 AM - Lantus Solostar refill  05/06/2019 Faxed [facility] refill request for Lantus Solostar Max 30 units daily, #3. [name]

## 2019-05-06 NOTE — BH Specialist Note (Signed)
Integrated Behavioral Health Follow Up Visit Via Phone  MRN: [id number] Name: Richard Riley.  Number of Richard Riley visits: 4/6  Type of Service: [place] Interpretor:No. Interpretor Name and Language: Not applicable.  SUBJECTIVE: Richard Riley. is a 48 y.o. male accompanied by himself. Patient was referred by Richard Farmer, MD for mental health. Patient reports the following symptoms/concerns: He reports that he hasn't had the best week. He reports that he is still not sleeping well and hasn't notice a difference in his mental health symptoms since starting the Mirtazapine. He explains that he is having a hard time shifting his focus away from the fact that he can no longer work, has health issues, lives with his parents, has no independence, and financially has no income to be able to pay child support/back child support. He reports that he contacted a disability attorney who told him to contact the social security office to get a form showing that his past pay when he was employed came in the form of cash. He notes that he has been walking twice a day a few days this week because he hasn't had much motivation to do anything. He notes that he is back to laying in the bed during the day. He reports that he hasn't worked on [employer] or things for enjoyment in the last week. He denies suicidal to suicidal thoughts but denies intent or plan. He denies homicidal thoughts.   Duration of problem: ; Severity of problem: severe  OBJECTIVE: Mood: Euthymic and Affect: Appropriate Risk of harm to self or others: No plan to harm self or others  LIFE CONTEXT: Family and Social: see above. School/Work: see above. Self-Care: see above. Life Changes: see above.  GOALS ADDRESSED: Patient will: 1.  Reduce symptoms of: depression  2.  Increase knowledge and/or ability of: coping skills, self-management skills and stress reduction  3.   Demonstrate ability to: Increase healthy adjustment to current life circumstances  INTERVENTIONS: Interventions utilized:  DBT was utilized by the Riley focusing on his depression and insomnia. Riley processed with the patient regarding how he has been doing since the last follow up session. Riley explained to the patient that he is spending a lot of time focusing on the past and things that are out of his control. Riley explained to the patient that he has to figure out a way to accept the situation that he is in in terms of not being able to work, having to live with his parents, and having no independence. Riley explained to the patient that she want him to make an actual list for a routine for the morning, afternoon, and evening to be reviewed at the next follow up session. Riley instructed the patient to journal about his emotions through out the day. Riley explained to the patient that its important that he open up to his parents about what he is experiencing and don't assume that they know already.  Standardized Assessments completed: GAD-7 and PHQ 9  ASSESSMENT: Patient currently experiencing see above .   Patient may benefit from see above.  PLAN: 1. Follow up with behavioral health Riley on : one week or earlier if needed. 2. Behavioral recommendations: Case consultation with Dr. Octavia Heir, MD, Psychiatric Consultant on Tuesday July 7th @ 9 am.  3. Referral(s): [facility] (In Clinic) 4. "From scale of 1-10, how likely are you to follow plan?":   Richard Hugger, LCSW

## 2019-05-11 ENCOUNTER — Telehealth: Payer: Self-pay | Admitting: Licensed Clinical Social Worker

## 2019-05-11 NOTE — Telephone Encounter (Signed)
Clinician returned the patient's voicemail wanting to know the name of the psychiatrist who is treating him for his anxiety and depression. She relayed that the psychiatrist name is Dr. Royston Bake, MD.

## 2019-05-12 ENCOUNTER — Other Ambulatory Visit: Payer: Self-pay

## 2019-05-12 DIAGNOSIS — Z9889 Other specified postprocedural states: Secondary | ICD-10-CM

## 2019-05-12 DIAGNOSIS — E119 Type 2 diabetes mellitus without complications: Secondary | ICD-10-CM

## 2019-05-12 DIAGNOSIS — E890 Postprocedural hypothyroidism: Secondary | ICD-10-CM

## 2019-05-13 ENCOUNTER — Ambulatory Visit: Payer: Self-pay | Admitting: Licensed Clinical Social Worker

## 2019-05-13 DIAGNOSIS — F332 Major depressive disorder, recurrent severe without psychotic features: Secondary | ICD-10-CM

## 2019-05-13 DIAGNOSIS — F411 Generalized anxiety disorder: Secondary | ICD-10-CM

## 2019-05-13 LAB — UA/M W/RFLX CULTURE, ROUTINE
Bilirubin, UA: NEGATIVE
Ketones, UA: NEGATIVE
Leukocytes,UA: NEGATIVE
Nitrite, UA: NEGATIVE
RBC, UA: NEGATIVE
Specific Gravity, UA: >=1.03 — AB (ref 1.005–1.030)
Urobilinogen, Ur: 0.2 mg/dL (ref 0.2–1.0)
pH, UA: 5 (ref 5.0–7.5)

## 2019-05-13 LAB — COMPREHENSIVE METABOLIC PANEL
ALT: 42 IU/L (ref 0–44)
AST: 17 IU/L (ref 0–40)
Albumin/Globulin Ratio: 1.8 (ref 1.2–2.2)
Albumin: 4.6 g/dL (ref 4.0–5.0)
Alkaline Phosphatase: 108 IU/L (ref 39–117)
BUN/Creatinine Ratio: 21 — ABNORMAL HIGH (ref 9–20)
BUN: 25 mg/dL — ABNORMAL HIGH (ref 6–24)
Bilirubin Total: 1.2 mg/dL (ref 0.0–1.2)
CO2: 24 mmol/L (ref 20–29)
Calcium: 10 mg/dL (ref 8.7–10.2)
Chloride: 94 mmol/L — ABNORMAL LOW (ref 96–106)
Creatinine, Ser: 1.19 mg/dL (ref 0.76–1.27)
GFR calc Af Amer: 84 mL/min/{1.73_m2} (ref >59)
GFR calc non Af Amer: 72 mL/min/{1.73_m2} (ref >59)
Globulin, Total: 2.6 g/dL (ref 1.5–4.5)
Glucose: 376 mg/dL — ABNORMAL HIGH (ref 65–99)
Potassium: 4.2 mmol/L (ref 3.5–5.2)
Sodium: 134 mmol/L (ref 134–144)
Total Protein: 7.2 g/dL (ref 6.0–8.5)

## 2019-05-13 LAB — CBC
Hematocrit: 46.1 % (ref 37.5–51.0)
Hemoglobin: 15.5 g/dL (ref 13.0–17.7)
MCH: 29.7 pg (ref 26.6–33.0)
MCHC: 33.6 g/dL (ref 31.5–35.7)
MCV: 88 fL (ref 79–97)
Platelets: 180 10*3/uL (ref 150–450)
RBC: 5.22 x10E6/uL (ref 4.14–5.80)
RDW: 12.6 % (ref 11.6–15.4)
WBC: 10.3 10*3/uL (ref 3.4–10.8)

## 2019-05-13 LAB — MICROSCOPIC EXAMINATION
Casts: NONE SEEN /lpf
Epithelial Cells (non renal): NONE SEEN /hpf (ref 0–10)

## 2019-05-13 LAB — T4 AND TSH
T4, Total: 11.7 ug/dL (ref 4.5–12.0)
TSH: 1.36 u[IU]/mL (ref 0.450–4.500)

## 2019-05-13 LAB — HEMOGLOBIN A1C
Est. average glucose Bld gHb Est-mCnc: 226 mg/dL
Hgb A1c MFr Bld: 9.5 % — ABNORMAL HIGH (ref 4.8–5.6)

## 2019-05-13 LAB — SEDIMENTATION RATE: Sed Rate: 14 mm/hr (ref 0–15)

## 2019-05-13 NOTE — BH Specialist Note (Signed)
Integrated Behavioral Health Follow Up Visit Via Phone  MRN: [id number] Name: Richard Riley.  Number of [place] Clinician visits: 5/6  Type of Service: [place] Interpretor:No. Interpretor Name and Language: Not applicable.  SUBJECTIVE: Richard Riley. is a 48 y.o. male accompanied by himself. Patient was referred by Andrey Farmer MD for mental health. Patient reports the following symptoms/concerns: He reports that the disability phone interview he had was mainly him giving the lady he spoke to information to fill out the application and she will send him a copy in the mail. He reports that he is still not able to sleep at night. He describe an increase in his symptoms of anxiety due to having court August 6th for child support and fears that he may go to jail again. He reports that he looked for fishing poles in the garage to go fishing but they don't have any. He notes that he is washing dishes in the evening, watching TV in the living room, completed a few word search/crossword puzzles, taking care of his dog, and walking for ten minutes twice a day. He notes that he did visit his grandmother for her 48st birthday and enjoyed spending time with her. He admits to suicidal thoughts denies intent or plan. He denies homicidal thoughts.  Duration of problem: ; Severity of problem: severe  OBJECTIVE: Mood: Euthymic and Affect: Appropriate Risk of harm to self or others: No plan to harm self or others  LIFE CONTEXT: Family and Social: See above. School/Work: See above. Self-Care: See above. Life Changes: See above.  GOALS ADDRESSED: Patient will: 1.  Reduce symptoms of: anxiety  2.  Increase knowledge and/or ability of: coping skills, healthy habits, self-management skills and stress reduction  3.  Demonstrate ability to: Increase healthy adjustment to current life circumstances  INTERVENTIONS: Interventions utilized:  DBT was  utilized by the clinician focusing on in order to improve his mental health symptoms that he will have to start changing his behaviors. Clinician processed with the patient regarding how he has been doing since the last follow up session. Clinician discussed with the patient that she is glad to hear that he was able to get the disability process started. Clinician explained to the patient that its been a little over a month since he started taking the Mirtazapine and will have a case consultation with Dr. Octavia Heir, MD, psychiatric consultant on Tuesday July 14th@ 9 am to discuss option of making changes in medications. Clinician explained to the patient that it sounds like he has already resigned himself to the fact that he will go to jail. Clinician discussed with the patient that in order to improve his mental health symptoms that he will have to start changing his behaviors. Clinician explained to the patient that isolating himself and lying in bed is only going to strengthening his symptoms of depression. Clinician explained to the patient that he needs to spend less time in his room, be social, and be around others. Clinician explained to the patient that it sounds like he was able to have an escape yesterday when he visited with his grandmother and enjoyed himself. Clinician explained to the patient that he is focusing a lot on what he cannot control versus what he can control.  Standardized Assessments completed: next session  ASSESSMENT: Patient currently experiencing see above.   Patient may benefit from see above.  PLAN: 1. Follow up with behavioral health clinician on : one week or earlier if  needed. 2. Behavioral recommendations: Case consultation with Dr. Octavia Heir, MD, Psychiatric consultant on Tuesday July 14th @ 9 am.  3. Referral(s): [place] (In Clinic) "From scale of 1-10, how likely are you to follow plan?":  Bayard Hugger, LCSW

## 2019-05-17 ENCOUNTER — Other Ambulatory Visit: Payer: Self-pay | Admitting: Internal Medicine

## 2019-05-18 ENCOUNTER — Other Ambulatory Visit: Payer: Self-pay

## 2019-05-18 MED ORDER — MIRTAZAPINE 30 MG PO TABS: 30.0000 mg | ORAL_TABLET | Freq: Every day | ORAL | 0 refills | Status: DC

## 2019-05-19 ENCOUNTER — Ambulatory Visit: Payer: Self-pay | Admitting: Internal Medicine

## 2019-05-20 ENCOUNTER — Ambulatory Visit: Payer: Self-pay | Admitting: Licensed Clinical Social Worker

## 2019-05-20 ENCOUNTER — Other Ambulatory Visit: Payer: Self-pay

## 2019-05-20 DIAGNOSIS — F332 Major depressive disorder, recurrent severe without psychotic features: Secondary | ICD-10-CM

## 2019-05-20 DIAGNOSIS — F411 Generalized anxiety disorder: Secondary | ICD-10-CM

## 2019-05-20 NOTE — BH Specialist Note (Signed)
Integrated Behavioral Health Follow Up Visit Via Phone  MRN: [id number] Name: Richard Riley.  Number of [place] Clinician visits: 6/6  Type of Service: [place] Interpretor:No. Interpretor Name and Language: not applicable.  SUBJECTIVE: [name]. is a 48 y.o. male accompanied by himself. Patient was referred by Andrey Farmer MD for mental health. Patient reports the following symptoms/concerns: He reports that he has yet to pick up the Mirtazapine 30 mg yet and asked if he could take two of the 15 mg until he picks up the new prescription. He explained that he is stressed out because he received a copy of the application that a woman at the social security office filled out for him and put on there that he is never been married. He explains that he has to call a number to have the application corrected but cannot remember the exact dates of his first or second marriage. He notes that he has had good days and bad days in terms of his depression and anxiety. He notes that he was feel asleep prior to dinner time until the following morning. He explains that is the most sleep he has gotten in awhile. He reports that he gave his dog a bath, took care of his dog, washed the dishes, and vacuumed the house some. He explains that he has been trying to focus on figuring things out for enjoyment but does not have a notebook yet. He reports that he is not experiencing suicidal thoughts everyday like he has in the past. He admits to thoughts half the time but denies intent or plan. He denies homicidal thoughts.  Duration of problem: ; Severity of problem: severe  OBJECTIVE: Mood: Euthymic and Affect: Appropriate Risk of harm to self or others: No plan to harm self or others  LIFE CONTEXT: Family and Social: See. above. School/Work: See above. Self-Care: See above. Life Changes: See above.  GOALS ADDRESSED: Patient will: 1.  Reduce symptoms  of: depression  2.  Increase knowledge and/or ability of: coping skills, healthy habits, self-management skills and stress reduction  3.  Demonstrate ability to: Increase healthy adjustment to current life circumstances  INTERVENTIONS: Interventions utilized:  DBT was utilized by the clinician focusing on the patient's mood and affect on behavior. Clinician processed with the patient regarding how he has been doing since the last follow up session. Clinician explained to the patient that he should probably reach back out to disability to rectify the issues on his application. Clinician provided the patient with the phone numbers for the Department of deeds and records for both [place] and [place]. Clinician discussed with the patient the case consultation that she had with Dr. Octavia Heir, MD, psychiatric consultant who recommended increasing his Mirtazapine from 15 mg to 30 mg at bedtime. Clinician asked the client if he has followed through with coming up with a list of things for enjoyment and establishing a routine. Clinician encouraged the patient to focus on what he can control versus what he cannot control. Standardized Assessments completed: GAD-7 and PHQ 9  ASSESSMENT: Patient currently experiencing see above.   Patient may benefit from see above.  PLAN: 1. Follow up with behavioral health clinician on : one week or earlier if needed. 2. Behavioral recommendations: Case consultation with Dr. Octavia Heir, MD, psychiatric consultant recommended to increase Mirtazapine from 15 mg to 30 mg at bedtime for insomnia, depression, and anxiety. Patient was agreeable to the plans.  3. Referral(s): [facility] 4. "From scale of 1-10, how likely are you to follow plan?":   Bayard Hugger, LCSW

## 2019-05-26 ENCOUNTER — Other Ambulatory Visit: Payer: Self-pay

## 2019-05-26 ENCOUNTER — Ambulatory Visit: Payer: Self-pay | Admitting: Internal Medicine

## 2019-05-26 DIAGNOSIS — E1140 Type 2 diabetes mellitus with diabetic neuropathy, unspecified: Secondary | ICD-10-CM

## 2019-05-26 MED ORDER — GABAPENTIN 100 MG PO CAPS: 300.0000 mg | ORAL_CAPSULE | Freq: Every day | ORAL | 2 refills | Status: DC

## 2019-05-26 NOTE — Progress Notes (Signed)
   Subjective:    Patient ID: Richard Riley., male    DOB: 1971-08-22, 48 y.o.   MRN: [id number]  Pt is a 48 year old male presenting for a telephonic visit.  Review of Systems Patient Active Problem List   Diagnosis Date Noted  . Hyperlipidemia associated with type 2 diabetes mellitus ([place]) 09/24/2018  . Medication monitoring encounter 02/11/2017  . Tobacco abuse 02/11/2017  . Essential hypertension, benign 02/11/2017  . S/P thyroidectomy 01/23/2017  . Type 2 diabetes mellitus not at goal [facility]) 10/22/2016   Allergies as of 05/26/2019   No Known Allergies     Medication List       Accurate as of May 26, 2019 10:32 AM. If you have any questions, ask your nurse or doctor.        atorvastatin 20 MG tablet Commonly known as: LIPITOR Take 1 tablet (20 mg total) by mouth daily.   cetirizine 10 MG tablet Commonly known as: ZYRTEC Take 1 tablet (10 mg total) by mouth daily.   fluticasone 50 MCG/ACT nasal spray Commonly known as: FLONASE PLACE 1 SPRAY INTO BOTH NOSTRILS 2 TIMES A DAY   hydrochlorothiazide 25 MG tablet Commonly known as: HYDRODIURIL Take 1 tablet (25 mg total) by mouth daily.   Insulin Glargine 100 UNIT/ML Solostar Pen Commonly known as: Lantus SoloStar Start with 22 units each evening. Increase dose by 2 units every three days if morning blood sugar over 130, as directed.   levothyroxine 200 MCG tablet Commonly known as: SYNTHROID TAKE ONE TABLET BY MOUTH EVERY DAY BEFORE BREAKFAST   levothyroxine 75 MCG tablet Commonly known as: SYNTHROID Take 1 tablet (75 mcg total) by mouth daily before breakfast.   lisinopril 40 MG tablet Commonly known as: ZESTRIL TAKE ONE TABLET (40 MG) BY MOUTH EVERY DAY   metFORMIN 1000 MG tablet Commonly known as: GLUCOPHAGE TAKE ONE TABLET BY MOUTH 2 TIMES A DAY WITH A MEAL.   metoprolol tartrate 50 MG tablet Commonly known as: LOPRESSOR TAKE ONE TABLET BY MOUTH 2 TIMES A DAY   mirtazapine 30 MG tablet Commonly  known as: REMERON Take 1 tablet (30 mg total) by mouth at bedtime.   omeprazole 20 MG capsule Commonly known as: PRILOSEC TAKE ONE CAPSULE BY MOUTH AT BEDTIME   Pen Needles 32G X 5 MM Misc 1 each by Does not apply route daily.   sitaGLIPtin 100 MG tablet Commonly known as: JANUVIA Take 1 tablet (100 mg total) by mouth daily.          Objective:   Physical Exam   No PE - telephonic visit     Assessment & Plan:   1. Diabetes  Self readings sound stable in the 200 range. Pt reports no low sugar symptoms. Pt continues to have pain and weakness in legs.Possible neuropathy related to his diabetes. Will empirically try New medication at an introductory dose:  - Gabapentin, 300 mg once a day at supper for suspected diabetic neuropathy.

## 2019-05-27 ENCOUNTER — Ambulatory Visit: Payer: Self-pay | Admitting: Licensed Clinical Social Worker

## 2019-05-27 ENCOUNTER — Other Ambulatory Visit: Payer: Self-pay

## 2019-05-27 DIAGNOSIS — F332 Major depressive disorder, recurrent severe without psychotic features: Secondary | ICD-10-CM

## 2019-05-27 DIAGNOSIS — F411 Generalized anxiety disorder: Secondary | ICD-10-CM

## 2019-05-27 NOTE — BH Specialist Note (Signed)
Integrated Behavioral Health Follow Up Visit Via Phone  MRN: [id number] Name: Richard Riley.  Type of Service: [place] Interpretor:No. Interpretor Name and Language: Not applicable.  SUBJECTIVE: Richard Riley. is a 48 y.o. male accompanied by himself. Patient was referred by Andrey Farmer MD for mental health. Patient reports the following symptoms/concerns: He reports that he spoke with Dr. Mable Fill yesterday who told him that the pain he has been experiencing is due to neuropathy and prescribed him Gabapentin 300 mg once a day around dinner time. He reports that he has been taking two of the Mirtazapine 15 mg at bedtime until he can pick up the 30 mg prescription. He reports that Mirtazapine seems to be helping a little bit more because he has been able to sleep longer than an hour at a time. He reports that he has noticed a slight difference in his overall mood but he still has his days. He notes that he still goes outside lets his dog run around, walk around, washes dishes, texts his children everyday, cleans some around the house,and watches TV some in the living room. He notes that he tried to contact disability three times about the mistakes to his application and left a voicemail. He denies suicidal and homicidal thoughts.  Duration of problem: ; Severity of problem: severe  OBJECTIVE: Mood: Euthymic and Affect: Appropriate Risk of harm to self or others: No plan to harm self or others  LIFE CONTEXT: Family and Social: See above. School/Work: See above. Self-Care: See above. Life Changes: See above.  GOALS ADDRESSED: Patient will: 1.  Reduce symptoms of: anxiety and depression  2.  Increase knowledge and/or ability of: coping skills, healthy habits, self-management skills and stress reduction  3.  Demonstrate ability to: Increase healthy adjustment to current life circumstances  INTERVENTIONS: Interventions utilized:  DBT was utilized by the  clinician focusing on the patient's mood and affect on his behavior. Clinician processed with the patient regarding how he has been doing since the last follow up session. Clinician discussed with the patient regarding how his appointment went with Dr. Mable Fill. Clinician asked the patient if he has noticed a difference with his depression, anxiety, or insomnia since the Mirtazapine was increased to 30 mg at bedtime. Clinician explained to the patient it sounds like he has been experiencing some relief in terms of insomnia and slightly improvement in mood symptoms.  Standardized Assessments completed: next session  ASSESSMENT: Patient currently experiencing see above.   Patient may benefit from see above.  PLAN: 1. Follow up with behavioral health clinician on : one week or earlier if needed. 2. Behavioral recommendations: see above. 3. Referral(s): [place] (In Clinic) 4. "From scale of 1-10, how likely are you to follow plan?":  Bayard Hugger, LCSW

## 2019-06-03 ENCOUNTER — Ambulatory Visit: Payer: Self-pay | Admitting: Licensed Clinical Social Worker

## 2019-06-03 ENCOUNTER — Other Ambulatory Visit: Payer: Self-pay

## 2019-06-03 DIAGNOSIS — F411 Generalized anxiety disorder: Secondary | ICD-10-CM

## 2019-06-03 DIAGNOSIS — F332 Major depressive disorder, recurrent severe without psychotic features: Secondary | ICD-10-CM

## 2019-06-03 NOTE — BH Specialist Note (Signed)
Integrated Behavioral Health Follow Up Visit Via Phone  MRN: [id number] Name: Richard Riley.  Type of Service: [place] Interpretor:No. Interpretor Name and Language: not applicable.  SUBJECTIVE: Richard Riley. is a 48 y.o. male accompanied by himself. Patient was referred by Andrey Farmer MD for mental health. Patient reports the following symptoms/concerns: He reports that he hasn't been feeling too good this week compared to last week. He reports that he has been feeling kind of sad lately because he isn't able to do anything. He explains that he has better days in terms of his depression than he used to. He reports that the gabapentin is helping with the pain. He describes having 2 or 3 night mares a night that will interrupt his sleep of people trying to harm him. He asked if the nightmares could be a side effect of the medication. He reports that the nightmares occur in the first half of his sleep and once he goes back to sleep, he can have the same nightmare and wake up again. He describes feeling intense fear and anxiety after one of these nightmares. He explains that it the time it takes for him to fall back asleep varies from night to night. He reports that he is stressing about life in general and about having to go to court for child support August 21 st. He explains that he fears having to go back to jail. He denies suicidal and homicidal thoughts.  Duration of problem: ; Severity of problem: moderate  OBJECTIVE: Mood: Euthymic and Affect: Appropriate Risk of harm to self or others: No plan to harm self or others  LIFE CONTEXT: Family and Social: see above. School/Work: Mr. Gleaves has applied for disability.  Self-Care: see above. Life Changes: see above.  GOALS ADDRESSED: Patient will: 1.  Reduce symptoms of: anxiety and insomnia  2.  Increase knowledge and/or ability of: coping skills, healthy habits, self-management skills and stress  reduction  3.  Demonstrate ability to: Increase healthy adjustment to current life circumstances  INTERVENTIONS: Interventions utilized: CBT was utilized by the clinician focusing on the patient's anxiety and nightmares. Clinician processed with the patient regarding how he has been doing since the last follow up session. Clinician discussed with the patient regarding that she can speak with Dr. Octavia Heir, MD, psychiatric consultant and ask him if nightmares are a side effect of Mirtazapine. Clinician discussed the severity of the patient's nightmares; ie frequency, plot, and duration. Clinician asked the patient what are some things that he is worrying and stressed about during his waking hours. Clinician explained to the patient that she thinks his anxiety over court for child support could be due to his underlying fear of having to go back to jail again. Clinician explained to the patient that despite not experiencing trauma in jail that it sounds like for him that it was a very unpleasant experience that he does not wish to repeat. Clinician discussed the concept of sleep hygiene with the patient. Clinician suggested that the patient try to leave his bed after waking up from a nightmare at least for half an hour or more then returning to bed to see if sleep is less interrupted rather than lying in bed or watching TV in bed.  Standardized Assessments completed: GAD-7 and PHQ 9  ASSESSMENT: Patient currently experiencing see above.   Patient may benefit from see above.  PLAN: 1. Follow up with behavioral health clinician on : one week or earlier if needed. 2.  Behavioral recommendations: see above.  3. Referral(s): [place] (In Clinic) 4. "From scale of 1-10, how likely are you to follow plan?":   Bayard Hugger, LCSW

## 2019-06-10 ENCOUNTER — Other Ambulatory Visit: Payer: Self-pay

## 2019-06-10 ENCOUNTER — Ambulatory Visit: Payer: Self-pay | Admitting: Licensed Clinical Social Worker

## 2019-06-10 DIAGNOSIS — F332 Major depressive disorder, recurrent severe without psychotic features: Secondary | ICD-10-CM

## 2019-06-10 DIAGNOSIS — F411 Generalized anxiety disorder: Secondary | ICD-10-CM

## 2019-06-10 NOTE — BH Specialist Note (Signed)
Integrated Behavioral Health Follow Up Visit Via Phone  MRN: [id number] Name: Richard Riley.  Type of Service: [place] Interpretor:No. Interpretor Name and Language: not applicable.  SUBJECTIVE: Richard Riley. is a 48 y.o. male accompanied by himself. Patient was referred by Andrey Farmer, MD for mental health. Patient reports the following symptoms/concerns: He reports that he feels like the Mirtazapine is not helping enough and is interested in trying the psychiatric consultant's recommendation of trying Cymbalta. He reports that disability sent additional paperwork to him to fill out regarding his employment history. He notes that he mailed a written letter with the corrections needed for his disability application. He notes that the fact that the person messed up his application for disability is frustrating. He notes that his mood has been up and down over the last week. He notes that he has been writing in a journal some. He reports that he has been getting outside and doing some walking with his dog. He notes that the weather has been a deterrent to spending time outside lately. He notes that he is still washing the dishes, taking out the trash, and vacuuming the floor. He notes that he did have some energy this morning to cook breakfast for himself and his parents. He describes having a couple of nightmares and they are not occuring as often. He notes that with being unable to sleep at night that he cannot help but to take naps during the day. He explains that leg pain and cramps all over is waking him up at night. He denies suicidal and homicidal thoughts.  Duration of problem: ; Severity of problem: severe  OBJECTIVE: Mood: Euthymic and Affect: Appropriate Risk of harm to self or others: No plan to harm self or others  LIFE CONTEXT: Family and Social: See above. School/Work: See above. Self-Care: See above. Life Changes: See above.   GOALS  ADDRESSED: Patient will: 1.  Reduce symptoms of: depression  2.  Increase knowledge and/or ability of: coping skills, healthy habits, self-management skills and stress reduction  3.  Demonstrate ability to: Increase healthy adjustment to current life circumstances  INTERVENTIONS: Interventions utilized: Brief DBT was utilized by the clinician focusing on the patient's depression and affect on behaviors. Clinician processed with the patient regarding how he has been doing since the last follow up session. Clinician asked the patient if the Mirtazapine is helping with his symptoms of insomnia, depression, and anxiety. Clinician explained to the patient that she had a case consultation with Dr. Octavia Heir, MD, psychiatric consultant who recommended that if Mirtazapine is not effective to discontinue it and start him on Cymbalta 60 mg daily. Clinician provided psycho education to the patient that Cymbalta is an antidepressant that can assist with depression, anxiety, and neuropathic pain. Clinician discussed with the patient regarding how he has been occupying his time in the last week. Clinician explained to the patient that she thinks that part of the nightmares he has been having is due to his upcoming court date for child support and fear that he will go back to jail again.  Standardized Assessments completed: Next session  ASSESSMENT: Patient currently experiencing see above..   Patient may benefit from see above.  PLAN: 1. Follow up with behavioral health clinician on : one week or earlier if needed. 2. Behavioral recommendations: Case consultation with Dr. Octavia Heir, MD, psychiatric consultant on 06/08/2019 recommended that if Mirtazapine 30 mg at bedtime was not working per the patient, to discontinue Mirtazapine and  start the patient on Cymbalta 60 mg daily. Patient was agreeable to the plans.  3. Referral(s): [place] (In Clinic) 4. "From scale of 1-10, how likely are you  to follow plan?":   Bayard Hugger, LCSW

## 2019-06-15 ENCOUNTER — Other Ambulatory Visit: Payer: Self-pay

## 2019-06-15 ENCOUNTER — Other Ambulatory Visit: Payer: Self-pay | Admitting: Internal Medicine

## 2019-06-15 DIAGNOSIS — E0590 Thyrotoxicosis, unspecified without thyrotoxic crisis or storm: Secondary | ICD-10-CM

## 2019-06-15 MED ORDER — DULOXETINE HCL 60 MG PO CPEP: 60.0000 mg | ORAL_CAPSULE | Freq: Every day | ORAL | 0 refills | Status: DC

## 2019-06-16 ENCOUNTER — Other Ambulatory Visit: Payer: Self-pay

## 2019-06-17 ENCOUNTER — Ambulatory Visit: Payer: Self-pay | Admitting: Licensed Clinical Social Worker

## 2019-06-17 ENCOUNTER — Other Ambulatory Visit: Payer: Self-pay

## 2019-06-17 ENCOUNTER — Telehealth: Payer: Self-pay | Admitting: Licensed Clinical Social Worker

## 2019-06-17 ENCOUNTER — Telehealth: Payer: Self-pay | Admitting: Pharmacist

## 2019-06-17 DIAGNOSIS — F411 Generalized anxiety disorder: Secondary | ICD-10-CM

## 2019-06-17 DIAGNOSIS — F332 Major depressive disorder, recurrent severe without psychotic features: Secondary | ICD-10-CM

## 2019-06-17 NOTE — BH Specialist Note (Signed)
Integrated Behavioral Health Follow Up Visit Via Phone  MRN: [id number] Name: Richard Riley.  Type of Service: [place] Interpretor:No. Interpretor Name and Language: not applicable.  SUBJECTIVE: Richard Riley. is a 48 y.o. male accompanied by himself. Patient was referred by Andrey Farmer MD for mental health. Patient reports the following symptoms/concerns: He reports that he is trying to go out for ten minutes twice a day. He notes that he has been cooking breakfast for the family in the mornings. He reports that he went to visit his grandma yesterday at the assisted living and she seems to be doing well. He notes that he picked up the Cymbalta along with other medications he needed today and plans to start the Cymbalta tomorrow. He denies having any nightmares this week. He explains that his sleep has been fluctuating this week between sleeping good and not being able to sleep at all. He notes that he experienced a lot of pain last night in his feet. He explains that he had a cramp in his shoulder and lower back that was so painful that he almost cried. He notes that his anxiety is still frequent but not as bad as it was. He notes that he was denied for disability and knows he has the opportunity to appeal the decision. He notes that he has child support next Friday and doesn't want to have to go back to jail. He reports that he texts his daughters back and forth everyday. He notes that he plans to visit his grandma more often and hopes to see his daughters some time soon. He denies suicidal and homicidal thoughts.  Duration of problem: ; Severity of problem: moderate  OBJECTIVE: Mood: Euthymic and Affect: Appropriate Risk of harm to self or others: No plan to harm self or others  LIFE CONTEXT: Family and Social: See above. School/Work: See above. Self-Care: See above. Life Changes: See above.   GOALS ADDRESSED: Patient will: 1.  Reduce symptoms of:  anxiety  2.  Increase knowledge and/or ability of: coping skills and healthy habits  3.  Demonstrate ability to: Increase healthy adjustment to current life circumstances  INTERVENTIONS: Interventions utilized:  DBT was utilized by the clinician focusing on the patient's anxiety. Clinician processed with the patient regarding how he has been doing since the last follow up session. Clinician asked the patient if he has started his Cymbalta yet. Clinician suggested that the patient not take his last dosage of Mirtazapine tonight and take the first dosage of Cymbalta in the morning. Clinician explained to the patient that it sounds like his exhaustion takes over and he is able to sleep some. Clinician encouraged the patient to continue taking the Gabapentin and to let her and Dr. Mable Fill, MD, know of worsening pain. Clinician discussed with the patient if he is sticking to his routine and trying to do things for enjoyment. Clinician encouraged the patient to get out of the house as much as possible to visit his kids or his grandma.  Standardized Assessments completed: GAD-7 and PHQ 9  ASSESSMENT: Patient currently experiencing see above.   Patient may benefit from see above.  PLAN: 1. Follow up with behavioral health clinician on : one week or earlier if needed. 2. Behavioral recommendations: see above. 3. Referral(s): [place] (In Clinic) 4. "From scale of 1-10, how likely are you to follow plan?":   Richard Hugger, LCSW

## 2019-06-17 NOTE — Telephone Encounter (Signed)
06/17/2019 2:43:06 PM - Lantus Solostar refill & new provider to [facility]  06/17/2019 Changed provider to [name], she has signed and I have today faxed to [name] for refill on Lantus Solostar Max daily dose 30 units #3 for refill.[name]

## 2019-06-17 NOTE — Telephone Encounter (Signed)
Opened in error

## 2019-06-17 NOTE — Telephone Encounter (Signed)
06/17/2019 2:43:55 PM - Richard Riley 100mg  to [facility] new provider  06/17/2019 [name] signed for [employer] 100mg  Take 1 tablet daily, mailing to [employer] for [employer]

## 2019-06-23 ENCOUNTER — Ambulatory Visit: Payer: Self-pay | Admitting: Internal Medicine

## 2019-06-23 ENCOUNTER — Other Ambulatory Visit: Payer: Self-pay

## 2019-06-23 DIAGNOSIS — E119 Type 2 diabetes mellitus without complications: Secondary | ICD-10-CM

## 2019-06-23 DIAGNOSIS — Z794 Long term (current) use of insulin: Secondary | ICD-10-CM

## 2019-06-23 NOTE — Progress Notes (Addendum)
   Subjective:    Patient ID: Richard Leber., male    DOB: Dec 02, 1970, 48 y.o.   MRN: [id number]  HPI  Patient is a 48 year old male presenting for a telephonic visit.  Review of Systems Allergies as of 06/23/2019   No Known Allergies     Medication List       Accurate as of June 23, 2019  9:34 AM. If you have any questions, ask your nurse or doctor.        atorvastatin 20 MG tablet Commonly known as: LIPITOR Take 1 tablet (20 mg total) by mouth daily.   cetirizine 10 MG tablet Commonly known as: ZYRTEC Take 1 tablet (10 mg total) by mouth daily.   DULoxetine 60 MG capsule Commonly known as: Cymbalta Take 1 capsule (60 mg total) by mouth daily.   fluticasone 50 MCG/ACT nasal spray Commonly known as: FLONASE PLACE 1 SPRAY INTO BOTH NOSTRILS 2 TIMES A DAY   gabapentin 100 MG capsule Commonly known as: NEURONTIN Take 3 capsules (300 mg total) by mouth daily after supper.   hydrochlorothiazide 25 MG tablet Commonly known as: HYDRODIURIL Take 1 tablet (25 mg total) by mouth daily.   Insulin Glargine 100 UNIT/ML Solostar Pen Commonly known as: Lantus SoloStar Start with 22 units each evening. Increase dose by 2 units every three days if morning blood sugar over 130, as directed.   levothyroxine 200 MCG tablet Commonly known as: SYNTHROID TAKE ONE TABLET BY MOUTH EVERY DAY BEFORE BREAKFAST   levothyroxine 75 MCG tablet Commonly known as: SYNTHROID Take 1 tablet (75 mcg total) by mouth daily before breakfast.   lisinopril 40 MG tablet Commonly known as: ZESTRIL TAKE ONE TABLET (40 MG) BY MOUTH EVERY DAY   metFORMIN 1000 MG tablet Commonly known as: GLUCOPHAGE TAKE ONE TABLET BY MOUTH 2 TIMES A DAY WITH A MEAL.   metoprolol tartrate 50 MG tablet Commonly known as: LOPRESSOR TAKE ONE TABLET BY MOUTH 2 TIMES A DAY   omeprazole 20 MG capsule Commonly known as: PRILOSEC TAKE ONE CAPSULE BY MOUTH AT BEDTIME   Pen Needles 32G X 5 MM Misc 1 each by Does not  apply route daily.   sitaGLIPtin 100 MG tablet Commonly known as: JANUVIA Take 1 tablet (100 mg total) by mouth daily.      Patient Active Problem List   Diagnosis Date Noted  . Hyperlipidemia associated with type 2 diabetes mellitus ([place]) 09/24/2018  . Medication monitoring encounter 02/11/2017  . Tobacco abuse 02/11/2017  . Essential hypertension, benign 02/11/2017  . S/P thyroidectomy 01/23/2017  . Type 2 diabetes mellitus not at goal [facility]) 10/22/2016       Objective:   Physical Exam  No PE - telephonic visit      Assessment & Plan:   1. Diabetes Mellitus  Patient reports that neuropathy suspected to be associated with diabetes has improved 80% with new gabapentin at 300mg  nightly.  Plan is to continue with gabapentin at 300mg  and reevaluate in 2 weeks.  Dr. Mable Fill to discuss possible sleep study.

## 2019-06-24 ENCOUNTER — Ambulatory Visit: Payer: Self-pay | Admitting: Licensed Clinical Social Worker

## 2019-06-29 ENCOUNTER — Ambulatory Visit: Payer: Self-pay | Admitting: Licensed Clinical Social Worker

## 2019-07-01 ENCOUNTER — Other Ambulatory Visit: Payer: Self-pay

## 2019-07-01 ENCOUNTER — Ambulatory Visit: Payer: Self-pay | Admitting: Licensed Clinical Social Worker

## 2019-07-01 DIAGNOSIS — F332 Major depressive disorder, recurrent severe without psychotic features: Secondary | ICD-10-CM

## 2019-07-01 DIAGNOSIS — F411 Generalized anxiety disorder: Secondary | ICD-10-CM

## 2019-07-01 NOTE — BH Specialist Note (Signed)
Integrated Behavioral Health Follow Up Visit Via Phone  MRN: [id number] Name: Richard Riley.  Type of Service: [place] Interpretor:No. Interpretor Name and Language: not applicable.  SUBJECTIVE: Richard Riley. is a 48 y.o. male accompanied by himself. Patient was referred by Andrey Farmer, MD for mental health. Patient reports the following symptoms/concerns: He explains that he started the Cymbalta but stopped it after four days due to having issues of lock jaw, feeling jittery, and sweaty. He notes that he has started taking the Mirtazapine 30 mg again. He asked if it was okay that he started taking the Mirtazapine again and make sure it's okay with Dr. Octavia Heir, MD. He explains that his sleep is fluctuating between sleeping all the way through some nights and not being able to sleep some nights. He notes that he talked to someone at child support court and court was continued until till sometime in September. He explains that he feels a little bit better that child support court has been pushed back. He explains that he is waiting to hear back from [place]. He explains that he has been experiencing pain just about every day but not consistently through out the day like it was before. He denies suicidal and homicidal thoughts.  Duration of problem: ; Severity of problem: moderate  OBJECTIVE: Mood: Euthymic and Affect: Appropriate Risk of harm to self or others: No plan to harm self or others  LIFE CONTEXT: Family and Social: See above. School/Work: See above. Self-Care: See above. Life Changes: See above.   GOALS ADDRESSED: Patient will: 1.  Reduce symptoms of: depression  2.  Increase knowledge and/or ability of: coping skills and healthy habits  3.  Demonstrate ability to: Increase healthy adjustment to current life circumstances  INTERVENTIONS: Interventions utilized:  Brief CBT was utilized by the clinician focusing on the  patient's mood and slight improvement in sleep. Clinician processed with the patient regarding how he has been doing since the last follow up session. Clinician explained to the patient that she would have a case consultation with Dr. Octavia Heir, MD, psychiatric consultant on Tuesday September 1st @ 9 am to discuss the recent side effects that he has experienced with Cymbalta and the option of resuming taking the Mirtazapine 30 mg at bedtime. Clinician explained to the patient that despite the set back with Cymbalta that it sounds like his sleep has improved over the last two weeks. Clinician explained to the patient that it sounds like he is experiencing less stress by the child support person he spoke with agreeing to push back the court date till the end of September so hopefully he can hear something from disability prior to that date. Clinician encouraged the patient to continue to utilize her coping skills, stick to a routine, work on finding things for enjoyment, and practice self care.  Standardized Assessments completed: GAD-7 and PHQ 9  ASSESSMENT: Patient currently experiencing see above.   Patient may benefit from see above.  PLAN: 1. Follow up with behavioral health clinician on :  2. Behavioral recommendations: Case consultation with Dr. Octavia Heir, MD, regarding discontinuing Cymbalta and resuming the patient on Mirtazapine 30 mg at bedtime on Tuesday September 1st @ 9 am. Dr. Octavia Heir, MD recommended that the Cymbalta 60 mg be discontinued and that the patient resume taking the Mirtazapine 30 mg at bedtime.  3. Referral(s): [place] (In Clinic) 4. "From scale of 1-10, how likely are you to follow plan?":  Bayard Hugger, LCSW

## 2019-07-07 ENCOUNTER — Other Ambulatory Visit: Payer: Self-pay

## 2019-07-07 ENCOUNTER — Ambulatory Visit: Payer: Self-pay | Admitting: Internal Medicine

## 2019-07-07 DIAGNOSIS — E119 Type 2 diabetes mellitus without complications: Secondary | ICD-10-CM

## 2019-07-07 DIAGNOSIS — E118 Type 2 diabetes mellitus with unspecified complications: Secondary | ICD-10-CM

## 2019-07-07 DIAGNOSIS — G479 Sleep disorder, unspecified: Secondary | ICD-10-CM

## 2019-07-07 MED ORDER — MIRTAZAPINE 30 MG PO TABS: 30.0000 mg | ORAL_TABLET | Freq: Every day | ORAL | 2 refills | Status: DC

## 2019-07-07 NOTE — Progress Notes (Signed)
   Subjective:    Patient ID: Richard Leber., male    DOB: 05-Jul-1971, 48 y.o.   MRN: [id number]  HPI  Patient is a 48 year old male presenting for a telephonic visit.  Review of Systems Patient Active Problem List   Diagnosis Date Noted  . Hyperlipidemia associated with type 2 diabetes mellitus ([place]) 09/24/2018  . Medication monitoring encounter 02/11/2017  . Tobacco abuse 02/11/2017  . Essential hypertension, benign 02/11/2017  . S/P thyroidectomy 01/23/2017  . Type 2 diabetes mellitus not at goal [facility]) 10/22/2016   Allergies as of 07/07/2019   No Known Allergies     Medication List       Accurate as of July 07, 2019  9:35 AM. If you have any questions, ask your nurse or doctor.        atorvastatin 20 MG tablet Commonly known as: LIPITOR Take 1 tablet (20 mg total) by mouth daily.   cetirizine 10 MG tablet Commonly known as: ZYRTEC Take 1 tablet (10 mg total) by mouth daily.   DULoxetine 60 MG capsule Commonly known as: Cymbalta Take 1 capsule (60 mg total) by mouth daily.   fluticasone 50 MCG/ACT nasal spray Commonly known as: FLONASE PLACE 1 SPRAY INTO BOTH NOSTRILS 2 TIMES A DAY   gabapentin 100 MG capsule Commonly known as: NEURONTIN Take 3 capsules (300 mg total) by mouth daily after supper.   hydrochlorothiazide 25 MG tablet Commonly known as: HYDRODIURIL Take 1 tablet (25 mg total) by mouth daily.   Insulin Glargine 100 UNIT/ML Solostar Pen Commonly known as: Lantus SoloStar Start with 22 units each evening. Increase dose by 2 units every three days if morning blood sugar over 130, as directed.   levothyroxine 200 MCG tablet Commonly known as: SYNTHROID TAKE ONE TABLET BY MOUTH EVERY DAY BEFORE BREAKFAST   levothyroxine 75 MCG tablet Commonly known as: SYNTHROID Take 1 tablet (75 mcg total) by mouth daily before breakfast.   lisinopril 40 MG tablet Commonly known as: ZESTRIL TAKE ONE TABLET (40 MG) BY MOUTH EVERY DAY   metFORMIN 1000 MG  tablet Commonly known as: GLUCOPHAGE TAKE ONE TABLET BY MOUTH 2 TIMES A DAY WITH A MEAL.   metoprolol tartrate 50 MG tablet Commonly known as: LOPRESSOR TAKE ONE TABLET BY MOUTH 2 TIMES A DAY   omeprazole 20 MG capsule Commonly known as: PRILOSEC TAKE ONE CAPSULE BY MOUTH AT BEDTIME   Pen Needles 32G X 5 MM Misc 1 each by Does not apply route daily.   sitaGLIPtin 100 MG tablet Commonly known as: JANUVIA Take 1 tablet (100 mg total) by mouth daily.          Objective:   Physical Exam  No PE - telelphonic visit      Assessment & Plan:   1. Sleep disorder, unspecified Discussed sleeping issues and possible sleep apnea. Per psychiatric recommendation, patient will be referred to sleep.  2. Diabetes with complication Peripheral neuropathy may not be doing well due to changes in medication.   3. Type 2 diabetes mellitus not at goal Pt has been checking his sugars a few times during the week alternating between breakfast and supper. Last AM check was 70 and PM was 150. Pt remains asymptomatic. No changes in his medication.  Labs ordered -met c -cbc -tsh -t4 -a1c -ua with reflex

## 2019-07-08 ENCOUNTER — Ambulatory Visit: Payer: Self-pay | Admitting: Licensed Clinical Social Worker

## 2019-07-08 ENCOUNTER — Other Ambulatory Visit: Payer: Self-pay

## 2019-07-08 DIAGNOSIS — F332 Major depressive disorder, recurrent severe without psychotic features: Secondary | ICD-10-CM

## 2019-07-08 DIAGNOSIS — F411 Generalized anxiety disorder: Secondary | ICD-10-CM

## 2019-07-08 NOTE — BH Specialist Note (Signed)
Integrated Behavioral Health Follow Up Visit Via Phone  MRN: [id number] Name: Richard Riley.  Type of Service: [place] Interpretor:No. Interpretor Name and Language: Not applicable.   SUBJECTIVE: Richard Netti. is a 48 y.o. male accompanied by himself.  Patient was referred by Andrey Farmer, MD for mental health. Patient reports the following symptoms/concerns: He reports that his two youngest daughters came to the house and stayed a couple of nights with him. He notes that he enjoyed spending time with his daughters and it was the first time he has seen them since Christmas. He reports that his mood has been up and down. He reports that he discussed the sleep study with Dr. Mable Fill, MD and put in a referral for him to do it through [facility]. He notes that he ended up accidentally falling asleep and set an alarm clock early today before the appointment today just in case. He reports that he has been following the same routine. He explains that he has been outside more this week than usual due to having his two youngest daughter and youngest niece visit him. He notes that he has been doing chores around the house. He explains that he swept off the car port, washed dishes a couple of times, and vacuumed. He notes that he experienced less anxiety this week due to having his youngest daughters around. He denies suicidal and homicidal thoughts.  Duration of problem: ; Severity of problem: moderate  OBJECTIVE: Mood: Euthymic and Affect: Appropriate Risk of harm to self or others: No plan to harm self or others  LIFE CONTEXT: Family and Social: See above. School/Work: See above. Self-Care: See above. Life Changes: See above.   GOALS ADDRESSED: Patient will: 1.  Reduce symptoms of: depression  2.  Increase knowledge and/or ability of: coping skills, healthy habits and self-management skills  3.  Demonstrate ability to: Increase healthy adjustment to current life  circumstances  INTERVENTIONS: Interventions utilized:  Brief CBT was utilized by the clinician discussing the patient's depression and slight improvement in his mood symptoms. Clinician processed with the patient regarding how he has been doing since the last follow up session. Clinician discussed with the patient regarding how his visit went with his two youngest daughters this week. Clinician suggested that the patient try to plan spending time with his daughters at least once a month. Clinician processed with the patient regarding if he has been sticking to a routine. Clinician encouraged the patient to continue to work on trying to find a balance with his routine of things for enjoyment and responsibilities. Clinician explained to the patient that this week he thinks that his slight improvement in sleep was due to burning off more energy during the day and spending time with his two youngest daughters. Clinician suggested that the patient try to get out of the house at least twice a week to visit his grandma, one of his children, or go to a store.  Standardized Assessments completed: next session  ASSESSMENT: Patient currently experiencing see above.   Patient may benefit from see above.  PLAN: 1. Follow up with behavioral health clinician on : one week or earlier if needed. 2. Behavioral recommendations: see above.  3. Referral(s): [place] (In Clinic) 4. "From scale of 1-10, how likely are you to follow plan?":   Bayard Hugger, LCSW

## 2019-07-15 ENCOUNTER — Ambulatory Visit: Payer: Self-pay | Admitting: Licensed Clinical Social Worker

## 2019-07-22 ENCOUNTER — Ambulatory Visit: Payer: Self-pay | Admitting: Licensed Clinical Social Worker

## 2019-07-22 ENCOUNTER — Other Ambulatory Visit: Payer: Self-pay

## 2019-07-22 DIAGNOSIS — F332 Major depressive disorder, recurrent severe without psychotic features: Secondary | ICD-10-CM

## 2019-07-22 DIAGNOSIS — F411 Generalized anxiety disorder: Secondary | ICD-10-CM

## 2019-07-22 NOTE — BH Specialist Note (Signed)
Integrated Behavioral Health Follow Up Visit Via Phone  MRN: [id number] Name: Richard Riley.  Type of Service: [place] Interpretor:No. Interpretor Name and Language: not applicable.  SUBJECTIVE: Richard Riley. is a 48 y.o. male accompanied by herself. Patient was referred by Andrey Farmer MD for mental health. Patient reports the following symptoms/concerns: He notes that not a whole lot has been happening since the last follow up session. He notes that he is still going outside letting his dog run around and has tried to walk at least once a day. He describes having nightmares three to four times a week. He notes that he is sleeping more both at night and during the day. He explains that he experiences pain at least twice a week. He notes that he has felt down and depressed three to four days out of the last two weeks. He notes that child support court is next Friday and is not feeling too good about it. He explains that he is scared that he will end up in jail if he has not heard from disability and does not have any money. He notes that he received a letter from disability stating that they want him to see their doctors but its the last he has heard from them. He denies suicidal and homicidal thoughts.  Duration of problem: ; Severity of problem: moderate  OBJECTIVE: Mood: Euthymic and Affect: Appropriate Risk of harm to self or others: No plan to harm self or others  LIFE CONTEXT: Family and Social: see above. School/Work: see above. Self-Care: see above. Life Changes: see above.  GOALS ADDRESSED: Patient will: 1.  Reduce symptoms of: anxiety  2.  Increase knowledge and/or ability of: self-management skills and stress reduction  3.  Demonstrate ability to: Increase healthy adjustment to current life circumstances  INTERVENTIONS: Interventions utilized:  Brief CBT was utilized by the clinician focusing on the patient's anxiety and and affect on  normal cognition. Clinician processed with the patient regarding how he has been doing since the last follow up session. Clinician processed with the patient regarding how he had to cancel his last appointment due to a stomach virus. Clinician explained to the patient that she is glad to hear that he is feeling better. Clinician suggested that the patient reach out to disability to see if he can get some kind of documentation stating that his case is pending to avoid him going to jail for child support court. Clinician encouraged the patient to also reach out the attorney that is working with him for disability to see if she has any suggestions.  Standardized Assessments completed: GAD-7 and PHQ 9  ASSESSMENT: Patient currently experiencing see above.   Patient may benefit from see above.  PLAN: 1. Follow up with behavioral health clinician on : one week or earlier if needed. 2. Behavioral recommendations: see above. 3. Referral(s): [place] (In Clinic) 4. "From scale of 1-10, how likely are you to follow plan?":   Bayard Hugger, LCSW

## 2019-07-29 ENCOUNTER — Other Ambulatory Visit: Payer: Self-pay

## 2019-07-29 ENCOUNTER — Ambulatory Visit: Payer: Self-pay | Admitting: Licensed Clinical Social Worker

## 2019-07-29 DIAGNOSIS — F411 Generalized anxiety disorder: Secondary | ICD-10-CM

## 2019-07-29 DIAGNOSIS — F332 Major depressive disorder, recurrent severe without psychotic features: Secondary | ICD-10-CM

## 2019-07-29 NOTE — BH Specialist Note (Signed)
Integrated Behavioral Health Follow Up Visit Via Phone.  MRN: [id number] Name: Richard Riley.   Type of Service: [place] Interpretor:No. Interpretor Name and Language: Not applicable.  SUBJECTIVE: Richard Riley. is a 48 y.o. male accompanied by himself.  Patient was referred by  Patient reports the following symptoms/concerns: He reports that he has been feeling anxious about child support court tomorrow morning. He notes that his fear is that if cannot pay anything tomorrow that he will have to go to jail. He notes that his sleep has not been the best lately. He notes that he is not sure how things will go tomorrow. He reports that he has not said anything to his kids about child support court. He notes that not much has changed in the last week. He notes that he was glad to be able to leave the house to come to the clinic to pick up what he needed. She notes that he visited his grandma a few weeks ago and she is scared about staying by herself at night in the assisted living home. He notes that he goes outside part of the day, takes care of his dog, wash dishes, watching TV, and vacuum floors. He notes that he has not really been able to come up with anything for enjoyment. He denies suicidal and homicidal thoughts.  Duration of problem: ; Severity of problem: moderate  OBJECTIVE: Mood: Euthymic and Affect: Appropriate Risk of harm to self or others: No plan to harm self or others  LIFE CONTEXT: Family and Social: see above. School/Work: see above. Self-Care: see above. Life Changes: see above.  GOALS ADDRESSED: Patient will: 1.  Reduce symptoms of: anxiety  2.  Increase knowledge and/or ability of: stress reduction  3.  Demonstrate ability to: Increase healthy adjustment to current life circumstances  INTERVENTIONS: Interventions utilized:  Supportive Counseling was utilized by the clinician focusing on the patient's anxiety and affect on  normal cognition. Clinician processed with the patient regarding how he has been doing since the last follow up session. Clinician explained to the patient that its normal to have some anxiety about having to go to court for any kind of reason. Clinician explained to the patient that he has a letter from her and disability showing that he is taking steps to improve his situation. Clinician explained to the patient that hopefully the documentation will work out in his favor. Clinician encouraged the patient to try to get out of the house at least once or twice a week.  Standardized Assessments completed: next session.   ASSESSMENT: Patient currently experiencing see above.  Patient may benefit from see above.  PLAN: 1. Follow up with behavioral health clinician on : one week or earlier if needed.  2. Behavioral recommendations: see above. 3. Referral(s): [place] (In Clinic) 4. "From scale of 1-10, how likely are you to follow plan?":    Bayard Hugger, LCSW

## 2019-08-05 ENCOUNTER — Other Ambulatory Visit: Payer: Self-pay

## 2019-08-05 ENCOUNTER — Ambulatory Visit: Payer: Self-pay | Admitting: Licensed Clinical Social Worker

## 2019-08-05 DIAGNOSIS — F411 Generalized anxiety disorder: Secondary | ICD-10-CM

## 2019-08-05 DIAGNOSIS — F332 Major depressive disorder, recurrent severe without psychotic features: Secondary | ICD-10-CM

## 2019-08-05 NOTE — BH Specialist Note (Signed)
Integrated Behavioral Health Follow Up Visit Via Phone  MRN: [id number] Name: Richard Riley.  Type of Service: [place] Interpretor:No. Interpretor Name and Language:   SUBJECTIVE: Richard Riley. is a 48 y.o. male accompanied by himself. Patient was referred by Andrey Farmer MD for mental health. Patient reports the following symptoms/concerns: He notes that he was pretty nervous when he went to court. He explains that he was provided with a court appointed lawyer and they continued the case until October 30th. He notes that had he not requested a court appointed lawyer that he would have ended up in jail for not being able to pay 1200 dollars. He explains that it was a big relief not having to go to jail. He notes that the case worker kept both letters that she was given. He notes that he is still feeding his dog, let her out everyday, wash dishes, cooks some, and cleaning some around the house. He notes that his kids are supposed to visit this weekend and is looking forward to that. He notes that his mood has been about the same. He notes that he is taking things day by day. He denies suicidal and homicidal thoughts.  Duration of problem: ; Severity of problem: moderate  OBJECTIVE: Mood: Euthymic and Affect: Appropriate Risk of harm to self or others: No plan to harm self or others  LIFE CONTEXT: Family and Social: see above. School/Work: see above. Self-Care: see above. Life Changes: see above.  GOALS ADDRESSED: Patient will: 1.  Reduce symptoms of: stress  2.  Increase knowledge and/or ability of: coping skills and self-management skills  3.  Demonstrate ability to: Increase healthy adjustment to current life circumstances  INTERVENTIONS: Interventions utilized:  Brief CBT was utilized by the clinician focusing on the patient's stress. Clinician processed with the patient regarding how he has been doing since the last follow up session. Clinician  explained to the patient that it sounds like he is relieved that the court date was continued and that he did not have to go to jail. Clinician suggested that he speak with his daughters to testify to the fact that they are no longer in their mother's care to plead his case that he should not have to pay child support. Clinician explained to the patient that the worst thing that could happen is that his daughters say no but hopefully one of them is bound to say yes. Clinician encouraged the patient to practice self care, focus on the positive, and continue to stick to a routine.  Standardized Assessments completed: GAD-7 and PHQ 9  ASSESSMENT: Patient currently experiencing see above.  Patient may benefit from see above.  PLAN: 1. Follow up with behavioral health clinician on : one week or earlier if needed.  2. Behavioral recommendations: see above. 3. Referral(s): [place] (In Clinic) 4. "From scale of 1-10, how likely are you to follow plan?":   Bayard Hugger, LCSW

## 2019-08-13 ENCOUNTER — Other Ambulatory Visit: Payer: Self-pay | Admitting: Internal Medicine

## 2019-08-13 DIAGNOSIS — E0590 Thyrotoxicosis, unspecified without thyrotoxic crisis or storm: Secondary | ICD-10-CM

## 2019-08-13 DIAGNOSIS — E1140 Type 2 diabetes mellitus with diabetic neuropathy, unspecified: Secondary | ICD-10-CM

## 2019-08-19 ENCOUNTER — Ambulatory Visit: Payer: Self-pay | Admitting: Licensed Clinical Social Worker

## 2019-08-19 ENCOUNTER — Telehealth: Payer: Self-pay | Admitting: Licensed Clinical Social Worker

## 2019-08-19 NOTE — Telephone Encounter (Signed)
Clinician attempted to contact the patient for their scheduled phone visit at 6:00 pm twice with both phone numbers on file and left voicemail with call back information.

## 2019-08-25 ENCOUNTER — Other Ambulatory Visit: Payer: Self-pay

## 2019-08-25 DIAGNOSIS — E119 Type 2 diabetes mellitus without complications: Secondary | ICD-10-CM

## 2019-08-26 LAB — MICROSCOPIC EXAMINATION

## 2019-08-26 LAB — COMPREHENSIVE METABOLIC PANEL
ALT: 57 IU/L — ABNORMAL HIGH (ref 0–44)
AST: 32 IU/L (ref 0–40)
Albumin/Globulin Ratio: 1.5 (ref 1.2–2.2)
Albumin: 4.4 g/dL (ref 4.0–5.0)
Alkaline Phosphatase: 91 IU/L (ref 39–117)
BUN/Creatinine Ratio: 24 — ABNORMAL HIGH (ref 9–20)
BUN: 26 mg/dL — ABNORMAL HIGH (ref 6–24)
Bilirubin Total: 1.4 mg/dL — ABNORMAL HIGH (ref 0.0–1.2)
CO2: 26 mmol/L (ref 20–29)
Calcium: 9.5 mg/dL (ref 8.7–10.2)
Chloride: 95 mmol/L — ABNORMAL LOW (ref 96–106)
Creatinine, Ser: 1.09 mg/dL (ref 0.76–1.27)
GFR calc Af Amer: 93 mL/min/{1.73_m2} (ref >59)
GFR calc non Af Amer: 80 mL/min/{1.73_m2} (ref >59)
Globulin, Total: 3 g/dL (ref 1.5–4.5)
Glucose: 313 mg/dL — ABNORMAL HIGH (ref 65–99)
Potassium: 4.4 mmol/L (ref 3.5–5.2)
Sodium: 135 mmol/L (ref 134–144)
Total Protein: 7.4 g/dL (ref 6.0–8.5)

## 2019-08-26 LAB — URINALYSIS, ROUTINE W REFLEX MICROSCOPIC
Bilirubin, UA: NEGATIVE
Ketones, UA: NEGATIVE
Leukocytes,UA: NEGATIVE
Nitrite, UA: NEGATIVE
RBC, UA: NEGATIVE
Specific Gravity, UA: >=1.03 — AB (ref 1.005–1.030)
Urobilinogen, Ur: 0.2 mg/dL (ref 0.2–1.0)
pH, UA: 5 (ref 5.0–7.5)

## 2019-08-26 LAB — CBC
Hematocrit: 49.6 % (ref 37.5–51.0)
Hemoglobin: 16.3 g/dL (ref 13.0–17.7)
MCH: 28.4 pg (ref 26.6–33.0)
MCHC: 32.9 g/dL (ref 31.5–35.7)
MCV: 87 fL (ref 79–97)
Platelets: 156 10*3/uL (ref 150–450)
RBC: 5.73 x10E6/uL (ref 4.14–5.80)
RDW: 12.7 % (ref 11.6–15.4)
WBC: 8.8 10*3/uL (ref 3.4–10.8)

## 2019-08-26 LAB — HEMOGLOBIN A1C
Est. average glucose Bld gHb Est-mCnc: 292 mg/dL
Hgb A1c MFr Bld: 11.8 % — ABNORMAL HIGH (ref 4.8–5.6)

## 2019-08-26 LAB — T4: T4, Total: 12.7 ug/dL — ABNORMAL HIGH (ref 4.5–12.0)

## 2019-08-26 LAB — TSH: TSH: 1.52 u[IU]/mL (ref 0.450–4.500)

## 2019-08-30 ENCOUNTER — Other Ambulatory Visit: Payer: Self-pay | Admitting: Internal Medicine

## 2019-09-01 ENCOUNTER — Other Ambulatory Visit: Payer: Self-pay

## 2019-09-01 ENCOUNTER — Ambulatory Visit: Payer: Self-pay | Admitting: Internal Medicine

## 2019-09-01 VITALS — BP 135/93 | HR 82 | Temp 97.9°F | Ht 72.0 in | Wt 275.9 lb

## 2019-09-01 DIAGNOSIS — R809 Proteinuria, unspecified: Secondary | ICD-10-CM

## 2019-09-01 DIAGNOSIS — E1140 Type 2 diabetes mellitus with diabetic neuropathy, unspecified: Secondary | ICD-10-CM

## 2019-09-01 DIAGNOSIS — I10 Essential (primary) hypertension: Secondary | ICD-10-CM

## 2019-09-01 DIAGNOSIS — Z794 Long term (current) use of insulin: Secondary | ICD-10-CM

## 2019-09-01 DIAGNOSIS — E890 Postprocedural hypothyroidism: Secondary | ICD-10-CM

## 2019-09-01 NOTE — Progress Notes (Signed)
Subjective:    Patient ID: Richard Leber., male    DOB: [birth year], 48 y.o.   MRN: [id number]  HPI  Patient is a 48 year old male presenting for a telephonic visit. Pt reports that his self-monitored blood checks have been running in the 200s. Pt reports taking 30 units of Lantis. Pt reports he gets up 1-2 times at night to use the bathroom. Pt reports dark-colored pee. Pt reports pain in his feet and legs with no improvement. Pt reports no gout or arthritis as far as he knows.  Review of Systems  Patient Active Problem List   Diagnosis Date Noted  . Hyperlipidemia associated with type 2 diabetes mellitus ([place]) 09/24/2018  . Medication monitoring encounter 02/11/2017  . Tobacco abuse 02/11/2017  . Essential hypertension, benign 02/11/2017  . S/P thyroidectomy 01/23/2017  . Type 2 diabetes mellitus not at goal [facility]) 10/22/2016    Allergies as of 09/01/2019   No Known Allergies     Medication List       Accurate as of September 01, 2019 10:33 AM. If you have any questions, ask your nurse or doctor.        atorvastatin 20 MG tablet Commonly known as: LIPITOR Take 1 tablet (20 mg total) by mouth daily.   cetirizine 10 MG tablet Commonly known as: ZYRTEC Take 1 tablet (10 mg total) by mouth daily.   fluticasone 50 MCG/ACT nasal spray Commonly known as: FLONASE PLACE 1 SPRAY INTO BOTH NOSTRILS 2 TIMES A DAY   gabapentin 300 MG capsule Commonly known as: NEURONTIN TAKE 1 CAPSULE ('300MG'$ ) BY MOUTH EVERY DAY AFTER SUPPER   hydrochlorothiazide 25 MG tablet Commonly known as: HYDRODIURIL Take 1 tablet (25 mg total) by mouth daily.   Insulin Glargine 100 UNIT/ML Solostar Pen Commonly known as: Lantus SoloStar Start with 22 units each evening. Increase dose by 2 units every three days if morning blood sugar over 130, as directed.   levothyroxine 200 MCG tablet Commonly known as: SYNTHROID TAKE ONE TABLET BY MOUTH EVERY DAY BEFORE BREAKFAST   levothyroxine 75 MCG tablet  Commonly known as: SYNTHROID Take 1 tablet (75 mcg total) by mouth daily before breakfast.   lisinopril 40 MG tablet Commonly known as: ZESTRIL TAKE ONE TABLET (40 MG) BY MOUTH EVERY DAY   metFORMIN 1000 MG tablet Commonly known as: GLUCOPHAGE TAKE ONE TABLET BY MOUTH 2 TIMES A DAY WITH A MEAL.   metoprolol tartrate 50 MG tablet Commonly known as: LOPRESSOR TAKE ONE TABLET BY MOUTH 2 TIMES A DAY   mirtazapine 30 MG tablet Commonly known as: REMERON Take 1 tablet (30 mg total) by mouth at bedtime.   omeprazole 20 MG capsule Commonly known as: PRILOSEC TAKE ONE CAPSULE BY MOUTH AT BEDTIME   Pen Needles 32G X 5 MM Misc 1 each by Does not apply route daily.   sitaGLIPtin 100 MG tablet Commonly known as: JANUVIA Take 1 tablet (100 mg total) by mouth daily.          Objective:   Physical Exam   No PE     Assessment & Plan:  1. Type 2 diabetes mellitus with diabetic neuropathy, with long-term current use of insulin (HCC) Pt says sugars have been in the 200s, currently on 30 units of Lantis at night. Noted that a1c has deteriorated, now at 11.8, up from 9.5 three months ago. I have stressed the need for him to check his sugars in the morning, increasing his lantis by 2 units  nightly until his morning sugars read at 130 or less. Reports neuropathy is stable and not deteriorating. Pain in his legs remain about the same.   - Comp Met (CMET); Future - CBC w/Diff; Future - HgB A1c; Future - Uric acid; Future - Urine Microalbumin w/creat. ratio; Future - Protein Electrophoresis, (serum); Future - Lipid Profile; Future - TSH; Future - T4; Future - UA/M w/rflx Culture, Routine; Future  2. Postoperative hypothyroidism Previous history of hypothyroid with thyroidectomy. Development of hypothyroid being treated with medication, current dose appears to be therapeutic.   3. Essential hypertension, benign Hypertension Diastolic still elevated, continue current medication.   4. Proteinuria, unspecified type Seems to persist. Work up of cause outside of diabetes is not apparent. Previous urin electrophoresis was non diagnostic (WNL). Uric acid casts have been noticed in recent specimen. High pacific rarity has been noted on multiple specimens. Plan a nephrology evaluation for his proteinuria. - Ambulatory referral to Nephrology   Return in 6 weeks with labs prior

## 2019-09-16 ENCOUNTER — Telehealth: Payer: Self-pay | Admitting: Pharmacist

## 2019-09-16 NOTE — Telephone Encounter (Signed)
09/16/2019 1:39:07 PM - Lantus Solostar call to [name]  09/16/2019 Called [employer] spoke with Sonia Side to check on refill/provider change faxed 06/17/2019--according to him they had just sent a shipment in July and it was too soon to process-patient is due a refill now, he stated I could refax to them to process-- I have refaxed today.[name]

## 2019-09-21 ENCOUNTER — Other Ambulatory Visit: Payer: Self-pay | Admitting: Internal Medicine

## 2019-09-21 DIAGNOSIS — E119 Type 2 diabetes mellitus without complications: Secondary | ICD-10-CM

## 2019-10-06 ENCOUNTER — Other Ambulatory Visit: Payer: Self-pay

## 2019-10-06 DIAGNOSIS — Z794 Long term (current) use of insulin: Secondary | ICD-10-CM

## 2019-10-08 LAB — LIPID PANEL
Chol/HDL Ratio: 5.7 ratio — ABNORMAL HIGH (ref 0.0–5.0)
Cholesterol, Total: 136 mg/dL (ref 100–199)
HDL: 24 mg/dL — ABNORMAL LOW (ref >39)
LDL Chol Calc (NIH): 21 mg/dL (ref 0–99)
Triglycerides: 706 mg/dL (ref 0–149)
VLDL Cholesterol Cal: 91 mg/dL — ABNORMAL HIGH (ref 5–40)

## 2019-10-08 LAB — CBC WITH DIFFERENTIAL/PLATELET
Basophils Absolute: 0.1 10*3/uL (ref 0.0–0.2)
Basos: 1 %
EOS (ABSOLUTE): 0.3 10*3/uL (ref 0.0–0.4)
Eos: 3 %
Hematocrit: 50.9 % (ref 37.5–51.0)
Hemoglobin: 16 g/dL (ref 13.0–17.7)
Immature Grans (Abs): 0 10*3/uL (ref 0.0–0.1)
Immature Granulocytes: 0 %
Lymphocytes Absolute: 3.8 10*3/uL — ABNORMAL HIGH (ref 0.7–3.1)
Lymphs: 37 %
MCH: 27.4 pg (ref 26.6–33.0)
MCHC: 31.4 g/dL — ABNORMAL LOW (ref 31.5–35.7)
MCV: 87 fL (ref 79–97)
Monocytes Absolute: 0.6 10*3/uL (ref 0.1–0.9)
Monocytes: 6 %
Neutrophils Absolute: 5.3 10*3/uL (ref 1.4–7.0)
Neutrophils: 53 %
Platelets: 190 10*3/uL (ref 150–450)
RBC: 5.84 x10E6/uL — ABNORMAL HIGH (ref 4.14–5.80)
RDW: 12.8 % (ref 11.6–15.4)
WBC: 10.1 10*3/uL (ref 3.4–10.8)

## 2019-10-08 LAB — COMPREHENSIVE METABOLIC PANEL
ALT: 36 IU/L (ref 0–44)
AST: 21 IU/L (ref 0–40)
Albumin/Globulin Ratio: 1.4 (ref 1.2–2.2)
Albumin: 4.1 g/dL (ref 4.0–5.0)
Alkaline Phosphatase: 110 IU/L (ref 39–117)
BUN/Creatinine Ratio: 25 — ABNORMAL HIGH (ref 9–20)
BUN: 28 mg/dL — ABNORMAL HIGH (ref 6–24)
Bilirubin Total: 0.9 mg/dL (ref 0.0–1.2)
CO2: 23 mmol/L (ref 20–29)
Calcium: 9.3 mg/dL (ref 8.7–10.2)
Chloride: 93 mmol/L — ABNORMAL LOW (ref 96–106)
Creatinine, Ser: 1.14 mg/dL (ref 0.76–1.27)
GFR calc Af Amer: 87 mL/min/{1.73_m2} (ref >59)
GFR calc non Af Amer: 76 mL/min/{1.73_m2} (ref >59)
Globulin, Total: 3 g/dL (ref 1.5–4.5)
Glucose: 462 mg/dL — ABNORMAL HIGH (ref 65–99)
Potassium: 4.1 mmol/L (ref 3.5–5.2)
Sodium: 132 mmol/L — ABNORMAL LOW (ref 134–144)
Total Protein: 7.1 g/dL (ref 6.0–8.5)

## 2019-10-08 LAB — PROTEIN ELECTROPHORESIS, SERUM
A/G Ratio: 1.2 (ref 0.7–1.7)
Albumin ELP: 3.8 g/dL (ref 2.9–4.4)
Alpha 1: 0.2 g/dL (ref 0.0–0.4)
Alpha 2: 0.9 g/dL (ref 0.4–1.0)
Beta: 1.4 g/dL — ABNORMAL HIGH (ref 0.7–1.3)
Gamma Globulin: 0.7 g/dL (ref 0.4–1.8)
Globulin, Total: 3.3 g/dL (ref 2.2–3.9)

## 2019-10-08 LAB — MICROSCOPIC EXAMINATION
Casts: NONE SEEN /lpf
Epithelial Cells (non renal): NONE SEEN /hpf (ref 0–10)

## 2019-10-08 LAB — UA/M W/RFLX CULTURE, ROUTINE
Bilirubin, UA: NEGATIVE
Ketones, UA: NEGATIVE
Leukocytes,UA: NEGATIVE
Nitrite, UA: NEGATIVE
RBC, UA: NEGATIVE
Specific Gravity, UA: >=1.03 — AB (ref 1.005–1.030)
Urobilinogen, Ur: 0.2 mg/dL (ref 0.2–1.0)
pH, UA: 5 (ref 5.0–7.5)

## 2019-10-08 LAB — HEMOGLOBIN A1C
Est. average glucose Bld gHb Est-mCnc: 338 mg/dL
Hgb A1c MFr Bld: 13.4 % — ABNORMAL HIGH (ref 4.8–5.6)

## 2019-10-08 LAB — MICROALBUMIN / CREATININE URINE RATIO
Creatinine, Urine: 49.8 mg/dL
Microalb/Creat Ratio: 267 mg/g creat — ABNORMAL HIGH (ref 0–29)
Microalbumin, Urine: 133 ug/mL

## 2019-10-08 LAB — URIC ACID: Uric Acid: 6.8 mg/dL (ref 3.7–8.6)

## 2019-10-08 LAB — T4: T4, Total: 9.3 ug/dL (ref 4.5–12.0)

## 2019-10-08 LAB — TSH: TSH: 4.08 u[IU]/mL (ref 0.450–4.500)

## 2019-10-13 ENCOUNTER — Ambulatory Visit: Payer: Self-pay | Admitting: Internal Medicine

## 2019-10-19 ENCOUNTER — Telehealth: Payer: Self-pay | Admitting: Pharmacist

## 2019-10-19 NOTE — Telephone Encounter (Signed)
10/19/2019 11:45:56 AM - [name] refill called to [facility]  10/19/2019 Called [facility] and placed refill for Januvia 100mg , [id number], leaves 2 refills.[name]

## 2019-10-26 ENCOUNTER — Other Ambulatory Visit: Payer: Self-pay | Admitting: Internal Medicine

## 2019-10-26 DIAGNOSIS — Z794 Long term (current) use of insulin: Secondary | ICD-10-CM

## 2019-10-26 DIAGNOSIS — E1140 Type 2 diabetes mellitus with diabetic neuropathy, unspecified: Secondary | ICD-10-CM

## 2019-10-27 ENCOUNTER — Ambulatory Visit: Payer: Self-pay | Admitting: Internal Medicine

## 2019-11-02 ENCOUNTER — Ambulatory Visit: Payer: Self-pay | Admitting: Licensed Clinical Social Worker

## 2019-11-02 ENCOUNTER — Other Ambulatory Visit: Payer: Self-pay

## 2019-11-02 DIAGNOSIS — F332 Major depressive disorder, recurrent severe without psychotic features: Secondary | ICD-10-CM

## 2019-11-02 DIAGNOSIS — F411 Generalized anxiety disorder: Secondary | ICD-10-CM

## 2019-11-02 NOTE — BH Specialist Note (Signed)
Integrated Behavioral Health Follow Up Visit Via Phone  MRN: [id number] Name: Richard Riley.  Type of Service: [place] Interpretor:No. Interpretor Name and Language: not applicable.   SUBJECTIVE: [name]. is a 48 y.o. male accompanied by himself. Patient was referred by Andrey Farmer, MD for mental health. Patient reports the following symptoms/concerns: He explains that the pain in his legs and arms are worsening. He notes that in addition to that pain that he has been having some sharp pains to the left in the middle of his chest that is worrisome. He notes that he has been enjoying spending time with his two youngest daughters the past week in a half. He reports that he was denied for disability and has asked them to send him paperwork for the appeal. He notes that he has had his good days and bad days in terms of his depression. He notes that he has been experiencing some anxiety as well due to child support court coming up in the next month. She denies suicidal and homicidal thoughts.  Duration of problem: ; Severity of problem: moderate  OBJECTIVE: Mood: Euthymic and Affect: Appropriate Risk of harm to self or others: No plan to harm self or others  LIFE CONTEXT: Family and Social: see above. School/Work: see above. Self-Care: see above. Life Changes: see above.   GOALS ADDRESSED: Patient will: 1.  Reduce symptoms of: anxiety  2.  Increase knowledge and/or ability of: self-management skills  3.  Demonstrate ability to: Increase healthy adjustment to current life circumstances  INTERVENTIONS: Interventions utilized:  Supportive Counseling was utilized by the clinician during today's follow up session. Clinician processed with the patient regarding how she has been doing since the last follow up session. Clinician explained to the patient that he has to fill out a release of information first for his medical records and then it takes  anywhere from five to ten business days for the medical records staff member to gather his records. Clinician explained to the patient that he can request that the staff member contact him when his records are ready to pick up. Clinician explained to the patient that she would not be in the office again until the following week and would be happy to reprint the original letter that she wrote him two months ago for child support court. Clinician administered the PHQ 9 and GAD 7 to the patient. Clinician discussed with the patient regarding how he is managing his symptoms of anxiety and depression. Clinician suggested that the patient relay his concerns regarding his heart to Dr. Mable Fill, MD on his next follow up visit.  Standardized Assessments completed: GAD-7 and PHQ 9  ASSESSMENT: Patient currently experiencing see above.   Patient may benefit from see above.  PLAN: 1. Follow up with behavioral health clinician on : two weeks or earlier if needed.  2. Behavioral recommendations: see above.  3. Referral(s): [place] (In Clinic) 4. "From scale of 1-10, how likely are you to follow plan?":   Bayard Hugger, LCSW

## 2019-11-03 ENCOUNTER — Telehealth: Payer: Self-pay

## 2019-11-03 ENCOUNTER — Ambulatory Visit: Payer: Self-pay | Admitting: Internal Medicine

## 2019-11-03 NOTE — Telephone Encounter (Signed)
Called patient for clarity on medical record request for the following:  - Does he need [place] to send to [place] (if so, address/phone/fax)?  - Does he want [place] to release records directly to him to take to agency requesting records? - What types of records are needed - certain date range?  All records or just notes and labs?    Left message with patient to return call.  Relayed that I am not at [facility] on 11/04/2019, however left alternate number of [phone] to reach me by on 12/31 from 8am - 12pm at my office at [facility] to discuss release further.    Will process request once more information received.  If no return call, will attempt to follow up with patient at later time.

## 2019-11-04 ENCOUNTER — Telehealth: Payer: Self-pay

## 2019-11-04 NOTE — Telephone Encounter (Signed)
Spoke with patient to confirm information to be released. Patient requests all records since June 2017, with exception to labs.  Confirmed he gave permission to release his mental health notes.  He mentioned he needed all records from his thyroid removal and procedures from the hospital.  Explained to patient that [facility] could only release records from [facility] and encouraged him to contact the hospital to request records for his procedures/visits if needed for hearing.  Relayed number to call for medical records department at [facility]. Patient expressed understanding.  Patient aware that medical records will be processed on 11/10/2019 and available to stop by [facility] during business hours on 11/11/2019 to pick up.

## 2019-11-08 ENCOUNTER — Telehealth: Payer: Self-pay | Admitting: Pharmacist

## 2019-11-08 NOTE — Telephone Encounter (Signed)
11/08/2019 10:24:06 AM - Lantus Solostar refill to [facility]  11/08/2019 Sending [employer] refill request to [facility] for Lantus Solostar Inject  max daily dose 30 units #2.[name]

## 2019-11-10 ENCOUNTER — Ambulatory Visit: Payer: Self-pay | Admitting: Internal Medicine

## 2019-11-10 ENCOUNTER — Other Ambulatory Visit: Payer: Self-pay

## 2019-11-10 DIAGNOSIS — R809 Proteinuria, unspecified: Secondary | ICD-10-CM

## 2019-11-10 DIAGNOSIS — E890 Postprocedural hypothyroidism: Secondary | ICD-10-CM

## 2019-11-10 DIAGNOSIS — F332 Major depressive disorder, recurrent severe without psychotic features: Secondary | ICD-10-CM

## 2019-11-10 DIAGNOSIS — E1165 Type 2 diabetes mellitus with hyperglycemia: Secondary | ICD-10-CM

## 2019-11-10 NOTE — Progress Notes (Signed)
Subjective:    Patient ID: Richard Riley., male    DOB: January 26, 1971, 49 y.o.   MRN: [id number]  HPI  Patient is a 49 year old male presenting for a telephonic visit. Pt reports his self-monitored blood sugar checks are running in the 200s. Pt reports checking them a couple times a week.   Review of Systems Patient Active Problem List   Diagnosis Date Noted  . Hyperlipidemia associated with type 2 diabetes mellitus ([place]) 09/24/2018  . Medication monitoring encounter 02/11/2017  . Tobacco abuse 02/11/2017  . Essential hypertension, benign 02/11/2017  . S/P thyroidectomy 01/23/2017  . Type 2 diabetes mellitus not at goal [facility]) 10/22/2016   Allergies as of 11/10/2019   No Known Allergies     Medication List       Accurate as of November 10, 2019 11:03 AM. If you have any questions, ask your nurse or doctor.        atorvastatin 20 MG tablet Commonly known as: LIPITOR Take 1 tablet (20 mg total) by mouth daily.   cetirizine 10 MG tablet Commonly known as: ZYRTEC Take 1 tablet (10 mg total) by mouth daily.   fluticasone 50 MCG/ACT nasal spray Commonly known as: FLONASE PLACE 1 SPRAY INTO BOTH NOSTRILS 2 TIMES A DAY   gabapentin 300 MG capsule Commonly known as: NEURONTIN TAKE 1 CAPSULE ('300MG'$ ) BY MOUTH EVERY DAY AFTER SUPPER   hydrochlorothiazide 25 MG tablet Commonly known as: HYDRODIURIL Take 1 tablet (25 mg total) by mouth daily.   Insulin Glargine 100 UNIT/ML Solostar Pen Commonly known as: Lantus SoloStar Start with 22 units each evening. Increase dose by 2 units every three days if morning blood sugar over 130, as directed.   Januvia 100 MG tablet Generic drug: sitaGLIPtin TAKE ONE TABLET BY MOUTH EVERY DAY   levothyroxine 200 MCG tablet Commonly known as: SYNTHROID TAKE ONE TABLET BY MOUTH EVERY DAY BEFORE BREAKFAST   levothyroxine 75 MCG tablet Commonly known as: SYNTHROID Take 1 tablet (75 mcg total) by mouth daily before breakfast.   lisinopril 40 MG  tablet Commonly known as: ZESTRIL TAKE ONE TABLET (40 MG) BY MOUTH EVERY DAY   metFORMIN 1000 MG tablet Commonly known as: GLUCOPHAGE TAKE ONE TABLET BY MOUTH 2 TIMES A DAY WITH A MEAL.   metoprolol tartrate 50 MG tablet Commonly known as: LOPRESSOR TAKE ONE TABLET BY MOUTH 2 TIMES A DAY   mirtazapine 30 MG tablet Commonly known as: REMERON TAKE ONE TABLET BY MOUTH AT BEDTIME   omeprazole 20 MG capsule Commonly known as: PRILOSEC TAKE ONE CAPSULE BY MOUTH AT BEDTIME   Pen Needles 32G X 5 MM Misc 1 each by Does not apply route daily.          Objective:   Physical Exam  No PE - telephonic visit      Assessment & Plan:  1. Uncontrolled type 2 diabetes mellitus with hyperglycemia (HCC) Poorly controlled. Pt is not monitoring his sugars very often. When he does check, pt reports they are in the 200-250 range. Recent labs show that his A1c is significantly elevated to 13.4 from 11.8 in October. Plan to schedule a endocrinology evaluation in a few weeks to see if he will improve on FARXIGA. - HgB A1c; Future - Comp Met (CMET); Future  2. Postoperative hypothyroidism Recent laboratory studies confirm current medication therapeutic.   3. Severe episode of recurrent major depressive disorder, without psychotic features ([place]) Under current management by Clinical Social at [facility]  4. Proteinuria, unspecified type Has a FU with the nephrologist in a few weeks to rule out underlying renal pathology.  Plan to recheck his serum protein electrophoresis along with a light chain quant. - IFE, PE and FLC, Serum; Future

## 2019-11-16 ENCOUNTER — Other Ambulatory Visit: Payer: Self-pay

## 2019-11-16 ENCOUNTER — Ambulatory Visit: Payer: Self-pay | Admitting: Licensed Clinical Social Worker

## 2019-11-16 DIAGNOSIS — F332 Major depressive disorder, recurrent severe without psychotic features: Secondary | ICD-10-CM

## 2019-11-16 DIAGNOSIS — F411 Generalized anxiety disorder: Secondary | ICD-10-CM

## 2019-11-16 NOTE — BH Specialist Note (Signed)
Integrated Behavioral Health Follow Up Visit Via Phone  MRN: [id number] Name: Richard Riley.   Type of Service: [place] Interpretor:No. Interpretor Name and Language: not applicable.   SUBJECTIVE: Richard Riley. is a 49 y.o. male accompanied by himself. Patient was referred by Dr. Mable Fill, MD for mental health. Patient reports the following symptoms/concerns: He notes that he did pick up his medical records. He reports that he has court on Friday and does not think he will end up going to jail. He asked exactly what a nephrologist would do for him and expressed concern about the complications with his kidney's. He notes that he enjoyed his time with his daughters for the holidays but is concerned about their welfare when they are away from him. He denies suicidal and homicidal thoughts.  Duration of problem: ; Severity of problem: mild  OBJECTIVE: Mood: Euthymic and Affect: Appropriate Risk of harm to self or others: No plan to harm self or others  LIFE CONTEXT: Family and Social: see above. School/Work: see above. Self-Care: see above. Life Changes: see above.   GOALS ADDRESSED: Patient will: 1.  Reduce symptoms of: anxiety  2.  Increase knowledge and/or ability of: coping skills and stress reduction  3.  Demonstrate ability to: Increase healthy adjustment to current life circumstances  INTERVENTIONS: Interventions utilized:  Supportive Counseling was utilized by the clinician during today's follow up session. Clinician processed with the patient regarding how he has been doing since the last follow up session. Clinician provided psycho education regarding the specialists role and why he is being referred to a specialist to check on his kidney's. Clinician explained to the patient that he sounds more confident now that he has his records and documentation to give to the judge as proof and his reasons for not working.  Standardized Assessments  completed: GAD-7 and PHQ 9  ASSESSMENT: Patient currently experiencing see above.   Patient may benefit from see above.  PLAN: 1. Follow up with behavioral health clinician on : two to three weeks. 2. Behavioral recommendations: see above.  3. Referral(s): [place] (In Clinic) 4. "From scale of 1-10, how likely are you to follow plan?":   Bayard Hugger, LCSW

## 2019-11-23 ENCOUNTER — Other Ambulatory Visit: Payer: Self-pay

## 2019-11-23 ENCOUNTER — Ambulatory Visit: Payer: Self-pay

## 2019-11-23 MED ORDER — MIRTAZAPINE 30 MG PO TABS: 30.0000 mg | ORAL_TABLET | Freq: Every day | ORAL | 2 refills | Status: DC

## 2019-11-24 ENCOUNTER — Telehealth: Payer: Self-pay | Admitting: Pharmacist

## 2019-11-24 NOTE — Telephone Encounter (Signed)
11/24/2019 11:18:58 AM - Lantus Solostar refill to [name]  11/24/2019 Faxed [employer] refill request for [name] 30 units one daily #2.[name]

## 2019-11-30 ENCOUNTER — Ambulatory Visit: Payer: Self-pay | Admitting: Licensed Clinical Social Worker

## 2019-11-30 ENCOUNTER — Other Ambulatory Visit: Payer: Self-pay

## 2019-11-30 DIAGNOSIS — F332 Major depressive disorder, recurrent severe without psychotic features: Secondary | ICD-10-CM

## 2019-11-30 DIAGNOSIS — F411 Generalized anxiety disorder: Secondary | ICD-10-CM

## 2019-11-30 NOTE — BH Specialist Note (Signed)
Integrated Behavioral Health Follow Up Visit Via Phone  MRN: [id number] Name: Richard Riley.  Type of Service: [employer] Interpretor:No. Interpretor Name and Language: not applicable.   SUBJECTIVE: Richard Riley. is a 49 y.o. male accompanied by himself. Patient was referred by Andrey Farmer, MD For mental health.  Patient reports the following symptoms/concerns: He notes that he went to court for child support and the judge lowered the payments from 515.00 to a 100.00 a month. He explains that he saw a different judge than the one who asked for his medical records. He notes that he filled out the appeal papers and sent them in. He explains that he decided to appeal by himself and if he is denied again that he will establish with a lawyer. He notes that he sees a doctor [facility] next Wednesday in regards to his kidneys. He explains that he still does not understand what is going on with his kidney's and is experiencing some anxiety about seeing a specialist. He denies suicidal and homicidal thoughts.  Duration of problem:; Severity of problem: mild  OBJECTIVE: Mood: Euthymic and Affect: Appropriate Risk of harm to self or others: No plan to harm self or others  LIFE CONTEXT: Family and Social: see above. School/Work: see above. Self-Care: see above. Life Changes: see above.  GOALS ADDRESSED: Patient will: 1.  Reduce symptoms of: anxiety  2.  Increase knowledge and/or ability of: self-management skills  3.  Demonstrate ability to: Increase healthy adjustment to current life circumstances  INTERVENTIONS: Interventions utilized:  Supportive Counseling was utilized by the clinician during today's follow up session. Clinician processed with the patient regarding how she has been doing since the last follow up session. Clinician discussed with the patient how things went at court. Clinician explained to the patient that despite the results that he sounds like  he is trying to remain positive about things. Clinician discussed with the patient where he is at in the appeal process with the denial for disability. Clinician explained to the patient that Dr. Mable Fill, MD would not have referred him out to a specialist if he did not need to and encouraged him to wait till his appointment until he jumps to conclusions.  Standardized Assessments completed: GAD-7 and PHQ 9  ASSESSMENT: Patient currently experiencing see above.   Patient may benefit from see above.  PLAN: 1. Follow up with behavioral health clinician on : two weeks or earlier if needed. 2. Behavioral recommendations: see above. 3. Referral(s): [place] (In Clinic) 4. "From scale of 1-10, how likely are you to follow plan?":   Bayard Hugger, LCSW

## 2019-12-16 ENCOUNTER — Ambulatory Visit: Payer: Self-pay | Admitting: Licensed Clinical Social Worker

## 2019-12-21 ENCOUNTER — Ambulatory Visit: Payer: Self-pay | Admitting: Student

## 2019-12-21 ENCOUNTER — Other Ambulatory Visit: Payer: Self-pay

## 2019-12-21 DIAGNOSIS — E119 Type 2 diabetes mellitus without complications: Secondary | ICD-10-CM

## 2019-12-21 NOTE — Progress Notes (Signed)
Follow up Diabetes/ Endocrine [facility]     Patient ID: Richard Amie., male   DOB: [birth year], 49 y.o.   MRN: [id number] Assessment:  Richard Aoi Frehner. is a 49 y.o. male who is seen in follow up for T2DM management at the request of Tawni Millers, MD. Given latest A1c of 13.4, and fasting glucose ~200, this patient would strongly benefit from an SGLTi like [place] if possible, especially given signs of nephropathy and retinopathy. For now, patient is recommended to increase Lantus by 2 units every 3 days until fasting glucose reaches 150 consistently. This patient would also benefit from switching from Januvia to GLP1 agonist, pending thyroidectomy was not malignant medullary thyroid cancer. Reported blurred vision is suggestive of retinopathy and patient should be followed regularly by ophthalmology.  Encounter Diagnoses   Plan:     1. Target fasting glucose of 150.  Increase Lantus by 2 units every 3 days if average fasting reading >150.   2. Recommend Farxiga if possible due to excellent glucose control and renal/cardioprotection. Main contraindications for SGLT inhibitors are amputations from diabetes (unless significant cardiovascular complications) or frequent UTIs.  3. Switch from Januvia to GLP1 agonist. Absolute contraindications are family history of medullary thyroid cancer or pancreatitis. Confirm this patient's thyroidectomy was not malignant.  4. Follow-up with ophthalmology ASAP and annually.  5. Keep nephrology appointment for March 2021.  6. Follow-up in 3 months    Subjective:  Richard Riley is a 49yo man with a PMH of thyroidectomy and uncontrolled T2DM (diagnosed 2017 by PCP). He reports doing "alright" but has a lot of pain in his legs that limits his mobility. He takes glucose readings every other day, usually ~200 fasting in the morning and ~250 at night. Lowest readings are between 100-200, but never below 100, and highest reading was 450. For T2DM management, he takes  1000mg  Metformin bid, 30 units of Lantus at night (most recent increase 12/19/19), and 100mg  Januvia daily. He's scheduled with [facility] in [place] in March for albuminuria suggestive of nephropathy; does not recall last ophthalmology appt, only vision test at PCP. He does report blurry vision. Diet is fairly limited in carbohydrates. Patient lives with father, who also has T2DM. Exercise is limited due to pain in legs.    Review of Systems  Eyes: Positive for visual disturbance.  Endocrine: Positive for polydipsia and polyuria.  Musculoskeletal: Positive for myalgias.    Richard Orlie Dakin.  has a past medical history of Heart murmur, Hypertension, PONV (postoperative nausea and vomiting), Sleep apnea, Thyroid disease, Tobacco abuse (02/11/2017), and Type 2 diabetes mellitus not at goal [facility]) (10/22/2016).  Family History, Social History, current Medications and allergies reviewed and updated in Epic.  Objective:    There were no vitals taken for this visit. Physical Exam      Data : I have personally reviewed pertinent labs and imaging studies, if indicated,  with the patient in clinic today.   Lab Orders  No laboratory test(s) ordered today    HC Readings from Last 3 Encounters:  No data found for [facility]    Wt Readings from Last 3 Encounters:  09/01/19 275 lb 14.4 oz (125.1 kg)  12/23/18 267 lb 4.8 oz (121.2 kg)  09/24/18 267 lb 9.6 oz (121.4 kg)

## 2019-12-28 ENCOUNTER — Other Ambulatory Visit: Payer: Self-pay | Admitting: Internal Medicine

## 2019-12-28 ENCOUNTER — Other Ambulatory Visit: Payer: Self-pay

## 2019-12-28 DIAGNOSIS — E0590 Thyrotoxicosis, unspecified without thyrotoxic crisis or storm: Secondary | ICD-10-CM

## 2019-12-28 DIAGNOSIS — Z9889 Other specified postprocedural states: Secondary | ICD-10-CM

## 2019-12-28 DIAGNOSIS — J3089 Other allergic rhinitis: Secondary | ICD-10-CM

## 2019-12-28 DIAGNOSIS — I10 Essential (primary) hypertension: Secondary | ICD-10-CM

## 2019-12-28 DIAGNOSIS — E890 Postprocedural hypothyroidism: Secondary | ICD-10-CM

## 2019-12-28 MED ORDER — LEVOTHYROXINE SODIUM 200 MCG PO TABS: ORAL_TABLET | ORAL | 0 refills | Status: DC

## 2019-12-29 ENCOUNTER — Other Ambulatory Visit: Payer: Self-pay

## 2019-12-29 DIAGNOSIS — R809 Proteinuria, unspecified: Secondary | ICD-10-CM

## 2019-12-29 DIAGNOSIS — E1165 Type 2 diabetes mellitus with hyperglycemia: Secondary | ICD-10-CM

## 2019-12-30 ENCOUNTER — Ambulatory Visit: Payer: Self-pay | Admitting: Licensed Clinical Social Worker

## 2019-12-31 LAB — COMPREHENSIVE METABOLIC PANEL
ALT: 47 IU/L — ABNORMAL HIGH (ref 0–44)
AST: 37 IU/L (ref 0–40)
Albumin/Globulin Ratio: 1.7 (ref 1.2–2.2)
Albumin: 4.9 g/dL (ref 4.0–5.0)
Alkaline Phosphatase: 99 IU/L (ref 39–117)
BUN/Creatinine Ratio: 24 — ABNORMAL HIGH (ref 9–20)
BUN: 29 mg/dL — ABNORMAL HIGH (ref 6–24)
Bilirubin Total: 1.1 mg/dL (ref 0.0–1.2)
CO2: 14 mmol/L — ABNORMAL LOW (ref 20–29)
Calcium: 10.1 mg/dL (ref 8.7–10.2)
Chloride: 95 mmol/L — ABNORMAL LOW (ref 96–106)
Creatinine, Ser: 1.19 mg/dL (ref 0.76–1.27)
GFR calc Af Amer: 83 mL/min/{1.73_m2} (ref >59)
GFR calc non Af Amer: 72 mL/min/{1.73_m2} (ref >59)
Globulin, Total: 2.9 g/dL (ref 1.5–4.5)
Glucose: 418 mg/dL — ABNORMAL HIGH (ref 65–99)
Potassium: 4.5 mmol/L (ref 3.5–5.2)
Sodium: 138 mmol/L (ref 134–144)
Total Protein: 7.8 g/dL (ref 6.0–8.5)

## 2019-12-31 LAB — SPECIMEN STATUS REPORT

## 2019-12-31 LAB — HGB A1C W/O EAG: Hgb A1c MFr Bld: 12.8 % — ABNORMAL HIGH (ref 4.8–5.6)

## 2020-01-05 ENCOUNTER — Ambulatory Visit: Payer: Self-pay | Admitting: Internal Medicine

## 2020-01-11 ENCOUNTER — Other Ambulatory Visit: Payer: Self-pay

## 2020-01-11 ENCOUNTER — Ambulatory Visit: Payer: Self-pay | Admitting: Licensed Clinical Social Worker

## 2020-01-11 DIAGNOSIS — F332 Major depressive disorder, recurrent severe without psychotic features: Secondary | ICD-10-CM

## 2020-01-11 DIAGNOSIS — F411 Generalized anxiety disorder: Secondary | ICD-10-CM

## 2020-01-11 NOTE — BH Specialist Note (Signed)
Integrated Behavioral Health Follow Up Visit Via Phone  MRN: [id number] Name: Richard Riley.  Type of Service: [place] Interpretor:No. Interpretor Name and Language: not applicable.   SUBJECTIVE: Richard Riley. is a 49 y.o. male accompanied by himself. Patient was referred by Dr. Mable Fill, MD for mental health.  Patient reports the following symptoms/concerns: He notes that he received a letter from disability and thought they needed more information but when he called was told they are still awaiting on a decision. He notes that he is still spending time outside some with his dog, washing dishes, cleaning, and watching TV. He explains that he has spent more time outside due to the warm weather lately. He notes that his crazy dreams had settled down for awhile but have since returned the past couple of nights. He notes that some nights he can sleep and other he is unable to get much sleep. He notes that he is having to take about four Advil every few hours because of the pain and does not think the gabapentin is helping. He notes that he is frustrated with not having any income and feels bad he cannot give one of his daughters a gift for her upcoming birthday. She denies suicidal and homicidal thoughts.  Duration of problem: ; Severity of problem: mild  OBJECTIVE: Mood: Euthymic and Affect: Appropriate Risk of harm to self or others: No plan to harm self or others  LIFE CONTEXT: Family and Social: see above. School/Work: see above.  Self-Care: see above. Life Changes: see above.  GOALS ADDRESSED: Patient will: 1.  Reduce symptoms of: anxiety  2.  Increase knowledge and/or ability of: healthy habits and self-management skills  3.  Demonstrate ability to: Increase healthy adjustment to current life circumstances  INTERVENTIONS: Interventions utilized:  Supportive Counseling was utilized by the clinician during today's follow up session. Clinician  processed with the patient regarding how he has been doing since the last follow up session. Clinician measured patient's anxiety and depression on a numerical scale. Clinician explained to the patient that he has to be patient and hopefully he will hear something soon for disability. Clinician encouraged the patient to bring up his issues with pain and belief that the Gabapentin is not helping him. Clinician encouraged the patient to continue to practice self care, stick to a routine, and focus on the positives in his life.  Standardized Assessments completed: GAD-7 and PHQ 9  ASSESSMENT: Patient currently experiencing see above.   Patient may benefit from see above.  PLAN: 1. Follow up with behavioral health clinician on : two to three weeks or earlier if needed. 2. Behavioral recommendations: see above. 3. Referral(s): [place] (In Clinic) 4. "From scale of 1-10, how likely are you to follow plan?":   Bayard Hugger, LCSW

## 2020-01-12 ENCOUNTER — Telehealth: Payer: Self-pay | Admitting: Pharmacy Technician

## 2020-01-12 ENCOUNTER — Ambulatory Visit: Payer: Self-pay | Admitting: Internal Medicine

## 2020-01-12 LAB — IFE, PE AND FLC, SERUM
Albumin SerPl Elph-Mcnc: 4.2 g/dL (ref 2.9–4.4)
Albumin/Glob SerPl: 1.2 (ref 0.7–1.7)
Alpha 1: 0.2 g/dL (ref 0.0–0.4)
Alpha2 Glob SerPl Elph-Mcnc: 1 g/dL (ref 0.4–1.0)
B-Globulin SerPl Elph-Mcnc: 1.7 g/dL — ABNORMAL HIGH (ref 0.7–1.3)
Gamma Glob SerPl Elph-Mcnc: 0.8 g/dL (ref 0.4–1.8)
Globulin, Total: 3.8 g/dL (ref 2.2–3.9)
Ig Kappa Free Light Chain: 37.4 mg/L — ABNORMAL HIGH (ref 3.3–19.4)
Ig Lambda Free Light Chain: 29.3 mg/L — ABNORMAL HIGH (ref 5.7–26.3)
IgA/Immunoglobulin A, Serum: 574 mg/dL — ABNORMAL HIGH (ref 90–386)
IgG (Immunoglobin G), Serum: 891 mg/dL (ref 603–1613)
IgM (Immunoglobulin M), Srm: 69 mg/dL (ref 20–172)
KAPPA/LAMBDA RATIO: 1.28 (ref 0.26–1.65)
Total Protein: 8 g/dL (ref 6.0–8.5)

## 2020-01-12 NOTE — Telephone Encounter (Signed)
Received updated proof of income.  Patient eligible to receive medication assistance at [facility] until time for re-certification in 9359, and as long as eligibility requirements continue to be met.  [place] [facility]

## 2020-01-19 ENCOUNTER — Ambulatory Visit: Payer: Medicaid Other | Admitting: Internal Medicine

## 2020-01-19 ENCOUNTER — Other Ambulatory Visit: Payer: Self-pay

## 2020-01-19 DIAGNOSIS — E1165 Type 2 diabetes mellitus with hyperglycemia: Secondary | ICD-10-CM

## 2020-01-19 NOTE — Progress Notes (Signed)
Subjective:    Patient ID: Richard Leber., male    DOB: 04-Nov-1971, 49 y.o.   MRN: [id number]  HPI  Patient is a 49 year old male presenting for a telephonic follow-up of diabetes. Patient reports his blood sugar has been in the 300s morning and evening. Pt reports he is taking 36 units of [place] at night. Pt says his appointment with kidney specialists in next Tuesday. Pt expresses concern about legs and knees hurting, and feels that the gabapentin is not helping much.   Review of Systems Patient Active Problem List   Diagnosis Date Noted  . Hyperlipidemia associated with type 2 diabetes mellitus ([place]) 09/24/2018  . Medication monitoring encounter 02/11/2017  . Tobacco abuse 02/11/2017  . Essential hypertension, benign 02/11/2017  . S/P thyroidectomy 01/23/2017  . Type 2 diabetes mellitus not at goal [facility]) 10/22/2016   Allergies as of 01/19/2020   No Known Allergies     Medication List       Accurate as of January 19, 2020 10:43 AM. If you have any questions, ask your nurse or doctor.        atorvastatin 20 MG tablet Commonly known as: LIPITOR Take 1 tablet (20 mg total) by mouth daily.   cetirizine 10 MG tablet Commonly known as: ZYRTEC TAKE ONE TABLET BY MOUTH EVERY DAY   fluticasone 50 MCG/ACT nasal spray Commonly known as: FLONASE PLACE 1 SPRAY INTO BOTH NOSTRILS 2 TIMES A DAY   gabapentin 300 MG capsule Commonly known as: NEURONTIN TAKE 1 CAPSULE (300MG ) BY MOUTH EVERY DAY AFTER SUPPER   hydrochlorothiazide 25 MG tablet Commonly known as: HYDRODIURIL TAKE ONE TABLET BY MOUTH EVERY DAY   insulin glargine 100 UNIT/ML Solostar Pen Commonly known as: Lantus SoloStar Start with 22 units each evening. Increase dose by 2 units every three days if morning blood sugar over 130, as directed.   Januvia 100 MG tablet Generic drug: sitaGLIPtin TAKE ONE TABLET BY MOUTH EVERY DAY   levothyroxine 75 MCG tablet Commonly known as: SYNTHROID TAKE ONE TABLET BY MOUTH  EVERY DAY BEFORE BREAKFAST WITH 218mcg FOR TOTAL OF 2101mcg.   levothyroxine 200 MCG tablet Commonly known as: SYNTHROID TAKE ONE TABLET BY MOUTH EVERY DAY BEFORE BREAKFAST   lisinopril 40 MG tablet Commonly known as: ZESTRIL TAKE ONE TABLET (40 MG) BY MOUTH EVERY DAY   metFORMIN 1000 MG tablet Commonly known as: GLUCOPHAGE TAKE ONE TABLET BY MOUTH 2 TIMES A DAY WITH A MEAL.   metoprolol tartrate 50 MG tablet Commonly known as: LOPRESSOR TAKE ONE TABLET BY MOUTH 2 TIMES A DAY   mirtazapine 30 MG tablet Commonly known as: REMERON Take 1 tablet (30 mg total) by mouth at bedtime.   omeprazole 20 MG capsule Commonly known as: PRILOSEC TAKE ONE CAPSULE BY MOUTH AT BEDTIME   Pen Needles 32G X 5 MM Misc 1 each by Does not apply route daily.          Objective:   Physical Exam  No PE - telephonic visit.      Assessment & Plan:  1. Uncontrolled type 2 diabetes mellitus with hyperglycemia (HCC) Pt reports sugar is still running in the 300 range, currently on 36 units at night. Admits not keeping track of the Lantus dosages recommended by endocrinology specialists last month. Goal is to get blood sugar in the morning under 150 by adjusting his Lantus every three days. If sugar is not less that 150, in 3 days he is to go to  42 units. Will discuss with him next week about his sugar status and insulin does.   Protienuria renal, appt with nephrologist scheduled for next Tuesday.

## 2020-01-25 ENCOUNTER — Ambulatory Visit: Payer: Medicaid Other | Admitting: Licensed Clinical Social Worker

## 2020-01-25 ENCOUNTER — Other Ambulatory Visit: Payer: Self-pay

## 2020-01-25 DIAGNOSIS — F332 Major depressive disorder, recurrent severe without psychotic features: Secondary | ICD-10-CM

## 2020-01-25 DIAGNOSIS — F411 Generalized anxiety disorder: Secondary | ICD-10-CM

## 2020-01-25 NOTE — BH Specialist Note (Signed)
Integrated Behavioral Health Follow Up Visit  MRN: [id number] Name: Richard Riley.  Type of Service: [place] Interpretor:No. Interpretor Name and Language: not applicable.   SUBJECTIVE: Richard Riley. is a 49 y.o. male accompanied by himself. Patient was referred by Andrey Farmer, MD, for mental health. Patient reports the following symptoms/concerns: He explains that he saw the specialist that he was referred to who told him that he was functioning at 75 % and wants to do an ultra sound of his kidney's. He notes that the news made him anxious. He notes that he applied for charity care and medicaid. He explains that he received a paper copy for a medicaid card in the mail. He asked if he would still be able to be seen at [facility]. He notes that he is trying to get the COVID 19 vaccine and was told to call back next week. He denies suicidal and homicidal thoughts.  Duration of problem: ; Severity of problem: moderate  OBJECTIVE: Mood: Euthymic and Affect: Appropriate Risk of harm to self or others: No plan to harm self or others  LIFE CONTEXT: Family and Social: see above. School/Work: see above. Self-Care: see above. Life Changes: see above.   GOALS ADDRESSED: Patient will: 1.  Reduce symptoms of: anxiety  2.  Increase knowledge and/or ability of: coping skills and self-management skills  3.  Demonstrate ability to: Increase healthy adjustment to current life circumstances  INTERVENTIONS: Interventions utilized:  Supportive Counseling was utilized by the clinician during today's follow up session. Clinician processed with the patient regarding how he has been doing since the last follow up session. Clinician measured the patient's anxiety and depression on a numerical scale. Clinician explained to the patient that it sounds like this specialist he saw has a concrete plan to lower his blood pressure and make sure his kidney's can be repaired.  Clinician explained to the patient that she will look up in the system to verify that he has medicaid but his chart says its family planning medicaid. Clinician explained to the client that family planning medicaid is mainly for pregnant women.  Standardized Assessments completed: GAD-7 and PHQ 9  ASSESSMENT: Patient currently experiencing see above.   Patient may benefit from see above.  PLAN: 1. Follow up with behavioral health clinician on : two weeks or earlier if needed. 2. Behavioral recommendations: see above. 3. Referral(s): [place] (In Clinic) 4. "From scale of 1-10, how likely are you to follow plan?":   Bayard Hugger, LCSW

## 2020-01-26 ENCOUNTER — Ambulatory Visit: Payer: Medicaid Other | Admitting: Internal Medicine

## 2020-01-26 DIAGNOSIS — E1165 Type 2 diabetes mellitus with hyperglycemia: Secondary | ICD-10-CM

## 2020-01-26 DIAGNOSIS — R809 Proteinuria, unspecified: Secondary | ICD-10-CM

## 2020-01-26 DIAGNOSIS — I10 Essential (primary) hypertension: Secondary | ICD-10-CM

## 2020-01-26 NOTE — Progress Notes (Signed)
Subjective:    Patient ID: Richard Riley., male    DOB: [birth year], 49 y.o.   MRN: [id number]  HPI  Patient is a 49 year old male presenting for a telephonic follow-up of diabetes. Pt reports he saw the nephrologist recently but has not gotten his new medication yet. Pt reports he is taking 40 units of Lantis and his blood sugar this morning was 318.  Review of Systems Patient Active Problem List   Diagnosis Date Noted  . Hyperlipidemia associated with type 2 diabetes mellitus ([place]) 09/24/2018  . Medication monitoring encounter 02/11/2017  . Tobacco abuse 02/11/2017  . Essential hypertension, benign 02/11/2017  . S/P thyroidectomy 01/23/2017  . Type 2 diabetes mellitus not at goal [facility]) 10/22/2016   Allergies as of 01/26/2020   No Known Allergies     Medication List       Accurate as of January 26, 2020 11:20 AM. If you have any questions, ask your nurse or doctor.        atorvastatin 20 MG tablet Commonly known as: LIPITOR Take 1 tablet (20 mg total) by mouth daily.   cetirizine 10 MG tablet Commonly known as: ZYRTEC TAKE ONE TABLET BY MOUTH EVERY DAY   fluticasone 50 MCG/ACT nasal spray Commonly known as: FLONASE PLACE 1 SPRAY INTO BOTH NOSTRILS 2 TIMES A DAY   gabapentin 300 MG capsule Commonly known as: NEURONTIN TAKE 1 CAPSULE (300MG ) BY MOUTH EVERY DAY AFTER SUPPER   hydrochlorothiazide 25 MG tablet Commonly known as: HYDRODIURIL TAKE ONE TABLET BY MOUTH EVERY DAY   insulin glargine 100 UNIT/ML Solostar Pen Commonly known as: Lantus SoloStar Start with 22 units each evening. Increase dose by 2 units every three days if morning blood sugar over 130, as directed.   Januvia 100 MG tablet Generic drug: sitaGLIPtin TAKE ONE TABLET BY MOUTH EVERY DAY   levothyroxine 75 MCG tablet Commonly known as: SYNTHROID TAKE ONE TABLET BY MOUTH EVERY DAY BEFORE BREAKFAST WITH 252mcg FOR TOTAL OF 21mcg.   levothyroxine 200 MCG tablet Commonly known as:  SYNTHROID TAKE ONE TABLET BY MOUTH EVERY DAY BEFORE BREAKFAST   lisinopril 40 MG tablet Commonly known as: ZESTRIL TAKE ONE TABLET (40 MG) BY MOUTH EVERY DAY   metFORMIN 1000 MG tablet Commonly known as: GLUCOPHAGE TAKE ONE TABLET BY MOUTH 2 TIMES A DAY WITH A MEAL.   metoprolol tartrate 50 MG tablet Commonly known as: LOPRESSOR TAKE ONE TABLET BY MOUTH 2 TIMES A DAY   mirtazapine 30 MG tablet Commonly known as: REMERON Take 1 tablet (30 mg total) by mouth at bedtime.   omeprazole 20 MG capsule Commonly known as: PRILOSEC TAKE ONE CAPSULE BY MOUTH AT BEDTIME   Pen Needles 32G X 5 MM Misc 1 each by Does not apply route daily.          Objective:   Physical Exam  No PE - telephonic visit     Assessment & Plan:  1. Essential hypertension, benign Nephrologist evaluated pt a couple days ago and added Amlodipine for improved control. Has not gotten the medication yet. BP reading by nephrologist prior to medication was 136/89.  - Basic Metabolic Panel (BMET); Future  2. Uncontrolled type 2 diabetes mellitus with hyperglycemia (HCC) Currently on 40 units of Lantis at night. Sugars are still running over 300. Has not been self adjusting per our suggestion of increasing 2 units of Lantis at night every two days until our target of less than 150 was reached. Plan to  increase Lantis to 46 units starting tonight and encouraged him to raise it by 2 units every 3 days until target under 200 is reached.  - HgB A1c; Future - Basic Metabolic Panel (BMET); Future  3. Proteinuria, unspecified type Recent evaluation by nephrologist felt it was related to his diabetes. Has a renal US pending per nephrologist.   Plan to f/u in a couple of weeks with labs a week prior..  Scheduled a COVID-19 vaccine for Friday.

## 2020-01-28 ENCOUNTER — Ambulatory Visit: Payer: Medicaid Other | Attending: Internal Medicine

## 2020-01-28 DIAGNOSIS — Z23 Encounter for immunization: Secondary | ICD-10-CM

## 2020-01-28 NOTE — Progress Notes (Signed)
   [id number] Vaccination Clinic  Name:  Richard Riley.    MRN: [id number] DOB: 1970/11/30  01/28/2020  Richard Riley was observed post Covid-19 immunization for 15 minutes without incident. He was provided with Vaccine Information Sheet and instruction to access the V-Safe system.   Richard Riley was instructed to call 911 with any severe reactions post vaccine: Marland Kitchen Difficulty breathing  . Swelling of face and throat  . A fast heartbeat  . A bad rash all over body  . Dizziness and weakness   Immunizations Administered    Name Date Dose VIS Date Route   Pfizer COVID-19 Vaccine 01/28/2020  3:26 PM 0.3 mL 10/15/2019 Intramuscular   Manufacturer: [place]   Lot: [id number]   [place]: [id number]

## 2020-02-02 ENCOUNTER — Other Ambulatory Visit: Payer: Self-pay

## 2020-02-02 ENCOUNTER — Other Ambulatory Visit: Payer: Medicaid Other

## 2020-02-02 DIAGNOSIS — I10 Essential (primary) hypertension: Secondary | ICD-10-CM

## 2020-02-02 DIAGNOSIS — E1165 Type 2 diabetes mellitus with hyperglycemia: Secondary | ICD-10-CM

## 2020-02-03 LAB — BASIC METABOLIC PANEL
BUN/Creatinine Ratio: 20 (ref 9–20)
BUN: 20 mg/dL (ref 6–24)
CO2: 21 mmol/L (ref 20–29)
Calcium: 9.6 mg/dL (ref 8.7–10.2)
Chloride: 94 mmol/L — ABNORMAL LOW (ref 96–106)
Creatinine, Ser: 0.99 mg/dL (ref 0.76–1.27)
GFR calc Af Amer: 104 mL/min/{1.73_m2} (ref >59)
GFR calc non Af Amer: 90 mL/min/{1.73_m2} (ref >59)
Glucose: 441 mg/dL — ABNORMAL HIGH (ref 65–99)
Potassium: 3.9 mmol/L (ref 3.5–5.2)
Sodium: 136 mmol/L (ref 134–144)

## 2020-02-03 LAB — HEMOGLOBIN A1C
Est. average glucose Bld gHb Est-mCnc: 312 mg/dL
Hgb A1c MFr Bld: 12.5 % — ABNORMAL HIGH (ref 4.8–5.6)

## 2020-02-07 ENCOUNTER — Other Ambulatory Visit: Payer: Self-pay | Admitting: Internal Medicine

## 2020-02-07 DIAGNOSIS — E1169 Type 2 diabetes mellitus with other specified complication: Secondary | ICD-10-CM

## 2020-02-07 DIAGNOSIS — E785 Hyperlipidemia, unspecified: Secondary | ICD-10-CM

## 2020-02-08 ENCOUNTER — Ambulatory Visit: Payer: Medicaid Other | Admitting: Licensed Clinical Social Worker

## 2020-02-08 ENCOUNTER — Other Ambulatory Visit: Payer: Self-pay

## 2020-02-08 DIAGNOSIS — F332 Major depressive disorder, recurrent severe without psychotic features: Secondary | ICD-10-CM

## 2020-02-08 DIAGNOSIS — F411 Generalized anxiety disorder: Secondary | ICD-10-CM

## 2020-02-08 NOTE — BH Specialist Note (Signed)
Integrated Behavioral Health Follow Up Visit Via Phone  MRN: [id number] Name: Richard Riley.  Type of Service: [place] Interpretor:No. Interpretor Name and Language: not applicable.   SUBJECTIVE: Richard Riley. is a 49 y.o. male accompanied by himself. Patient was referred by Dr. Mable Fill, MD for mental health. Patient reports the following symptoms/concerns: He explains that he received a letter from [facility] that confused him because it says he is a 100% covered for eligible services with charity care but it says certain co pays are due at time of visit. He notes that he got his first COVID 19 vaccine and his arm was sore. He discussed COVID 19, wearing masks, and current events. He notes that his two youngest children visited Thursday and stayed till yesterday. He explains that he talked to his oldest daughter on the phone most of the day. He notes that he has been outside a little bit more often. He denies suicidal and homicidal thoughts.  Duration of problem: ; Severity of problem: moderate  OBJECTIVE: Mood: Euthymic and Affect: Appropriate Risk of harm to self or others: No plan to harm self or others  LIFE CONTEXT: Family and Social: see above. School/Work: see above. Self-Care: see above. Life Changes: see above.   GOALS ADDRESSED: Patient will: 1.  Reduce symptoms of: stress  2.  Increase knowledge and/or ability of: coping skills and self-management skills  3.  Demonstrate ability to: Increase healthy adjustment to current life circumstances  INTERVENTIONS: Interventions utilized:  Supportive Counseling was utilized by the clinician during today's follow up session. Clinician processed with the patient regarding how she has been doing the last follow up session. Clinician measured the patient's anxiety and depression on a numerical scale. Clinician explained to the patient that she looked up his Medicaid and it is family planning. Clinician  explained to the patient that what family planning medicaid was and that it cannot be used for the coverage of medical services. Clinician explained to the patient that she thought just like [facility] that [facility] would cover the full cost. Clinician explained to the patient that it sounds like his spirits were lifted with visiting with his daughters for Easter.  Standardized Assessments completed: GAD-7 and PHQ 9  ASSESSMENT: Patient currently experiencing see above.   Patient may benefit from see above.  PLAN: 1. Follow up with behavioral health clinician on :  2. Behavioral recommendations:  3. Referral(s): [place] (In Clinic) 4. "From scale of 1-10, how likely are you to follow plan?":   Bayard Hugger, LCSW

## 2020-02-09 ENCOUNTER — Ambulatory Visit: Payer: Medicaid Other | Admitting: Internal Medicine

## 2020-02-09 DIAGNOSIS — I10 Essential (primary) hypertension: Secondary | ICD-10-CM

## 2020-02-09 DIAGNOSIS — R809 Proteinuria, unspecified: Secondary | ICD-10-CM

## 2020-02-09 DIAGNOSIS — E119 Type 2 diabetes mellitus without complications: Secondary | ICD-10-CM

## 2020-02-09 NOTE — Progress Notes (Signed)
Subjective:    Patient ID: Richard Riley., male    DOB: Nov 09, 1970, 49 y.o.   MRN: [id number]  HPI  Patient is a 49 year old male presenting for a telephonic visit for a follow-up of diabetes. Patient reports he is still checking his blood sugar regularly every morning. Pt reports his sugar reading this morning was 338 and is currently on 46 units of Lantis.  Patient reports that the nephrologist stated his kidneys looked normal but that he needed to drink more water for an upcoming scan still to be scheduled. Pt reports that the nephrologist increased his gabapentin dose from 300mg  to 1200mg  daily.  Review of Systems Patient Active Problem List   Diagnosis Date Noted  . Hyperlipidemia associated with type 2 diabetes mellitus ([place]) 09/24/2018  . Medication monitoring encounter 02/11/2017  . Tobacco abuse 02/11/2017  . Essential hypertension, benign 02/11/2017  . S/P thyroidectomy 01/23/2017  . Type 2 diabetes mellitus not at goal [facility]) 10/22/2016   Allergies as of 02/09/2020   No Known Allergies     Medication List       Accurate as of February 09, 2020 10:06 AM. If you have any questions, ask your nurse or doctor.        atorvastatin 20 MG tablet Commonly known as: LIPITOR Take 1 tablet (20 mg total) by mouth daily.   cetirizine 10 MG tablet Commonly known as: ZYRTEC TAKE ONE TABLET BY MOUTH EVERY DAY   fluticasone 50 MCG/ACT nasal spray Commonly known as: FLONASE PLACE 1 SPRAY INTO BOTH NOSTRILS 2 TIMES A DAY   gabapentin 300 MG capsule Commonly known as: NEURONTIN Take one pill twice during the day and take two pills at bedtime What changed: Another medication with the same name was removed. Continue taking this medication, and follow the directions you see here.   hydrochlorothiazide 25 MG tablet Commonly known as: HYDRODIURIL TAKE ONE TABLET BY MOUTH EVERY DAY   insulin glargine 100 UNIT/ML Solostar Pen Commonly known as: Lantus SoloStar Start with 22 units  each evening. Increase dose by 2 units every three days if morning blood sugar over 130, as directed.   Januvia 100 MG tablet Generic drug: sitaGLIPtin TAKE ONE TABLET BY MOUTH EVERY DAY   levothyroxine 75 MCG tablet Commonly known as: SYNTHROID TAKE ONE TABLET BY MOUTH EVERY DAY BEFORE BREAKFAST WITH 271mcg FOR TOTAL OF 299mcg.   levothyroxine 200 MCG tablet Commonly known as: SYNTHROID TAKE ONE TABLET BY MOUTH EVERY DAY BEFORE BREAKFAST   lisinopril 40 MG tablet Commonly known as: ZESTRIL TAKE ONE TABLET (40 MG) BY MOUTH EVERY DAY   metFORMIN 1000 MG tablet Commonly known as: GLUCOPHAGE TAKE ONE TABLET BY MOUTH 2 TIMES A DAY WITH A MEAL.   metoprolol tartrate 50 MG tablet Commonly known as: LOPRESSOR TAKE ONE TABLET BY MOUTH 2 TIMES A DAY   mirtazapine 30 MG tablet Commonly known as: REMERON Take 1 tablet (30 mg total) by mouth at bedtime.   omeprazole 20 MG capsule Commonly known as: PRILOSEC TAKE ONE CAPSULE BY MOUTH AT BEDTIME   Pen Needles 32G X 5 MM Misc 1 each by Does not apply route daily.          Objective:   Physical Exam  No PE      Assessment & Plan:  1. Type 2 diabetes mellitus not at goal [facility]) Currently on 46 units every night. This morning, his finger stick sugar was 330 with no hyperglycemic symptoms elicited. Plan to  increase his Lantis to 50 units increasing to 2 units nightly until sugars under 300. Will discuss management with him in one week. Has an endocrine appt in mid-May. Trying to stablize his sugars so we can get him on an SGLT2 inhibitor.   2. Proteinuria, unspecified type Being worked up by nephrologist. [place] has discovered a questionalbe mass on his kidney and a CAT scan has been ordered on his kidneys. Proteinuria cause is thought to be related to his diabetes. Nephrologist believes his leg pain in diabetic neuropathy and has increased his gabapentin dose.   3. Essential hypertension, benign After starting amlodipine for his BP  by nephrologist, recent BP reading in our office 142/95. Will continue for possible adjustment needed.  FU by phone next week to discuss his diabetes management.

## 2020-02-16 ENCOUNTER — Telehealth: Payer: Self-pay | Admitting: Internal Medicine

## 2020-02-16 ENCOUNTER — Ambulatory Visit: Payer: Medicaid Other | Admitting: Internal Medicine

## 2020-02-16 NOTE — Telephone Encounter (Signed)
Dr. Andrey Farmer connected with patient at a different phone number, provided by the patient's mother.   Patient recorded the following blood sugar readings as requested. They are improving. - 3/10 BS 305 - 3/11 BS 313 - 3/12 BS 200 - 3/13 BS 172 - 3/14 BS 183  Provider instructed patient to continue taking Lantis at 50 units at bedtime.  Provider instructed patient to continue checking blood sugar daily and recording his readings, alternating between morning before breakfast and before supper.   Patient will keep his appt for next week.

## 2020-02-18 ENCOUNTER — Ambulatory Visit: Payer: Medicaid Other | Attending: Internal Medicine

## 2020-02-18 DIAGNOSIS — Z23 Encounter for immunization: Secondary | ICD-10-CM

## 2020-02-18 NOTE — Progress Notes (Signed)
   [id number] Vaccination Clinic  Name:  Richard Riley.    MRN: [id number] DOB: July 01, 1971  02/18/2020  Richard Riley was observed post Covid-19 immunization for 15 minutes without incident. He was provided with Vaccine Information Sheet and instruction to access the V-Safe system.   Richard Riley was instructed to call 911 with any severe reactions post vaccine: Marland Kitchen Difficulty breathing  . Swelling of face and throat  . A fast heartbeat  . A bad rash all over body  . Dizziness and weakness   Immunizations Administered    Name Date Dose VIS Date Route   Pfizer COVID-19 Vaccine 02/18/2020  3:49 PM 0.3 mL 10/15/2019 Intramuscular   Manufacturer: [place]   Lot: [id number]   [place]: [id number]

## 2020-02-23 ENCOUNTER — Ambulatory Visit: Payer: Medicaid Other | Admitting: Internal Medicine

## 2020-02-23 ENCOUNTER — Other Ambulatory Visit: Payer: Self-pay

## 2020-02-23 DIAGNOSIS — N2889 Other specified disorders of kidney and ureter: Secondary | ICD-10-CM

## 2020-02-23 DIAGNOSIS — E119 Type 2 diabetes mellitus without complications: Secondary | ICD-10-CM

## 2020-02-23 NOTE — Progress Notes (Signed)
Subjective:    Patient ID: Richard Riley., male    DOB: Jan 01, 1971, 49 y.o.   MRN: [id number]  HPI  Patient is a 49 year old male presenting for a follow up of diabetes. Patient reports he has been recording his blood sugar readings. Fasting morning readings this past week were: 144, 183, 195, 159, 187. Evening readings before supper were: 89, 210, 284, 122. Patient reports taking only 20 units on Thursday evening, due to his evening reading of 89. Patient then continued taking 50 units on subsequent nights.   Patient reports he has been improving his diet.   Patient reports no new concerns.  Review of Systems Patient Active Problem List   Diagnosis Date Noted  . Hyperlipidemia associated with type 2 diabetes mellitus ([place]) 09/24/2018  . Medication monitoring encounter 02/11/2017  . Tobacco abuse 02/11/2017  . Essential hypertension, benign 02/11/2017  . S/P thyroidectomy 01/23/2017  . Type 2 diabetes mellitus not at goal [facility]) 10/22/2016   Allergies as of 02/23/2020   No Known Allergies     Medication List       Accurate as of February 23, 2020 10:08 AM. If you have any questions, ask your nurse or doctor.        atorvastatin 20 MG tablet Commonly known as: LIPITOR TAKE ONE TABLET BY MOUTH EVERY DAY   cetirizine 10 MG tablet Commonly known as: ZYRTEC TAKE ONE TABLET BY MOUTH EVERY DAY   fluticasone 50 MCG/ACT nasal spray Commonly known as: FLONASE PLACE 1 SPRAY INTO BOTH NOSTRILS 2 TIMES A DAY   gabapentin 300 MG capsule Commonly known as: NEURONTIN Take one pill twice during the day and take two pills at bedtime   hydrochlorothiazide 25 MG tablet Commonly known as: HYDRODIURIL TAKE ONE TABLET BY MOUTH EVERY DAY   insulin glargine 100 UNIT/ML Solostar Pen Commonly known as: Lantus SoloStar Start with 22 units each evening. Increase dose by 2 units every three days if morning blood sugar over 130, as directed.   Januvia 100 MG tablet Generic drug:  sitaGLIPtin TAKE ONE TABLET BY MOUTH EVERY DAY   levothyroxine 75 MCG tablet Commonly known as: SYNTHROID TAKE ONE TABLET BY MOUTH EVERY DAY BEFORE BREAKFAST WITH 250mcg FOR TOTAL OF 275mcg.   levothyroxine 200 MCG tablet Commonly known as: SYNTHROID TAKE ONE TABLET BY MOUTH EVERY DAY BEFORE BREAKFAST   lisinopril 40 MG tablet Commonly known as: ZESTRIL TAKE ONE TABLET (40 MG) BY MOUTH EVERY DAY   metFORMIN 1000 MG tablet Commonly known as: GLUCOPHAGE TAKE ONE TABLET BY MOUTH 2 TIMES A DAY WITH A MEAL.   metoprolol tartrate 50 MG tablet Commonly known as: LOPRESSOR TAKE ONE TABLET BY MOUTH 2 TIMES A DAY   mirtazapine 30 MG tablet Commonly known as: REMERON Take 1 tablet (30 mg total) by mouth at bedtime.   omeprazole 20 MG capsule Commonly known as: PRILOSEC TAKE ONE CAPSULE BY MOUTH AT BEDTIME   Pen Needles 32G X 5 MM Misc 1 each by Does not apply route daily.          Objective:   Physical Exam  No PE      Assessment & Plan:  1. Type 2 diabetes mellitus not at goal [facility]) Patient seems to be doing well. His random AM blood sugar readings were as follows: 144, 183, 195, 159, 187. His random PM sugars were: 89, 210, 284, 122. On the night he had an 89 reading, he reduced his Lantis to 20 units  but then resumed Lantis 50 units.  Plan is to continue the current 50 units of Lantis at night, continue checking sugars daily, alternating before breakfast and before supper. Plan to recheck in 1 week.   2. Left renal mass Possible renal mass was detected by an ultrasound ordered by his nephrologist in April. Further CAT scan evaluation is scheduled for Monday at [facility].

## 2020-02-24 ENCOUNTER — Telehealth: Payer: Self-pay | Admitting: Pharmacist

## 2020-02-24 NOTE — Telephone Encounter (Signed)
02/24/2020 10:29:00 AM - Lantus Solostar renewal to [place] - Thursday, February 24, 2020 10:28 AM --Virgel Gess [employer] renewal application for Lantus Solostar Inject 30 units daily #3.

## 2020-02-29 ENCOUNTER — Ambulatory Visit: Payer: Medicaid Other | Admitting: Licensed Clinical Social Worker

## 2020-02-29 ENCOUNTER — Encounter: Payer: Self-pay | Admitting: Licensed Clinical Social Worker

## 2020-02-29 ENCOUNTER — Other Ambulatory Visit: Payer: Self-pay

## 2020-02-29 DIAGNOSIS — F411 Generalized anxiety disorder: Secondary | ICD-10-CM

## 2020-02-29 DIAGNOSIS — F332 Major depressive disorder, recurrent severe without psychotic features: Secondary | ICD-10-CM

## 2020-02-29 NOTE — BH Specialist Note (Signed)
Integrated Behavioral Health Follow Up Visit Via Phone  MRN: [id number] Name: Richard Riley.   Type of Service: [place] Interpretor:No. Interpretor Name and Language: not applicable.   SUBJECTIVE: [name]. is a 49 y.o. male accompanied by himself. Patient was referred by Andrey Farmer MD for mental health. Patient reports the following symptoms/concerns: He notes that not much has happened in the last few weeks. He explained that he had a CT scan but still is not sure what is wrong with his kidney's yet. He notes that he has made efforts to cut back on carbohydrates and the portions of the food he consumes. He notes that he can tell a difference in the way he feels with the change in his diet and increase of the dosage of his Gabapentin. He notes that he has been trying to get outside more and spend time with his dog outside. He denies suicidal and homicidal thoughts.  Duration of problem: ; Severity of problem: mild  OBJECTIVE: Mood: Euthymic and Affect: Appropriate Risk of harm to self or others: No plan to harm self or others  LIFE CONTEXT: Family and Social: see above. School/Work: see above. Self-Care: see above. Life Changes: see above.  GOALS ADDRESSED: Patient will: 1.  Reduce symptoms of: anxiety  2.  Increase knowledge and/or ability of: healthy habits and self-management skills  3.  Demonstrate ability to: Increase healthy adjustment to current life circumstances  INTERVENTIONS: Interventions utilized:  Supportive Counseling was utilized by the clinician during today's follow up session. Clinician processed with the patient regarding how he has been doing since the last follow up session. Clinician measured the patient's anxiety and depression on a numerical scale. Clinician discussed healthy diet options with the patient and encouraged him to strive to eat healthier to lower his A1C. Clinician suggested that the patient continue  to practice self care and utilize his support system. Standardized Assessments completed: GAD-7 & PHQ-9 social determinants screening completed.   ASSESSMENT: Patient currently experiencing see above.   Patient may benefit from see above.  PLAN: 1. Follow up with behavioral health clinician on :  2. Behavioral recommendations: see above. 3. Referral(s): [place] (In Clinic) 4. "From scale of 1-10, how likely are you to follow plan?":   Bayard Hugger, LCSW

## 2020-03-01 ENCOUNTER — Ambulatory Visit: Payer: Medicaid Other | Admitting: Internal Medicine

## 2020-03-01 DIAGNOSIS — N2889 Other specified disorders of kidney and ureter: Secondary | ICD-10-CM

## 2020-03-01 DIAGNOSIS — R809 Proteinuria, unspecified: Secondary | ICD-10-CM

## 2020-03-01 DIAGNOSIS — E119 Type 2 diabetes mellitus without complications: Secondary | ICD-10-CM

## 2020-03-01 DIAGNOSIS — I10 Essential (primary) hypertension: Secondary | ICD-10-CM

## 2020-03-01 NOTE — Progress Notes (Signed)
Subjective:    Patient ID: Richard Leber., male    DOB: 03-28-1971, 49 y.o.   MRN: [id number]  HPI  Patient is a 49 year old male presenting for a telephonic visit of diabetes. Patient states he is doing good.   Patient reports his readings have been good this past week: 93, 105, 182, 157, 105, and 124.  Review of Systems Patient Active Problem List   Diagnosis Date Noted  . Hyperlipidemia associated with type 2 diabetes mellitus ([place]) 09/24/2018  . Medication monitoring encounter 02/11/2017  . Tobacco abuse 02/11/2017  . Essential hypertension, benign 02/11/2017  . S/P thyroidectomy 01/23/2017  . Type 2 diabetes mellitus not at goal [facility]) 10/22/2016   Allergies as of 03/01/2020   No Known Allergies     Medication List       Accurate as of March 01, 2020 10:28 AM. If you have any questions, ask your nurse or doctor.        atorvastatin 20 MG tablet Commonly known as: LIPITOR TAKE ONE TABLET BY MOUTH EVERY DAY   cetirizine 10 MG tablet Commonly known as: ZYRTEC TAKE ONE TABLET BY MOUTH EVERY DAY   fluticasone 50 MCG/ACT nasal spray Commonly known as: FLONASE PLACE 1 SPRAY INTO BOTH NOSTRILS 2 TIMES A DAY   gabapentin 300 MG capsule Commonly known as: NEURONTIN Take one pill twice during the day and take two pills at bedtime   hydrochlorothiazide 25 MG tablet Commonly known as: HYDRODIURIL TAKE ONE TABLET BY MOUTH EVERY DAY   insulin glargine 100 UNIT/ML Solostar Pen Commonly known as: Lantus SoloStar Start with 22 units each evening. Increase dose by 2 units every three days if morning blood sugar over 130, as directed.   Januvia 100 MG tablet Generic drug: sitaGLIPtin TAKE ONE TABLET BY MOUTH EVERY DAY   levothyroxine 75 MCG tablet Commonly known as: SYNTHROID TAKE ONE TABLET BY MOUTH EVERY DAY BEFORE BREAKFAST WITH 215mcg FOR TOTAL OF 267mcg.   levothyroxine 200 MCG tablet Commonly known as: SYNTHROID TAKE ONE TABLET BY MOUTH EVERY DAY BEFORE  BREAKFAST   lisinopril 40 MG tablet Commonly known as: ZESTRIL TAKE ONE TABLET (40 MG) BY MOUTH EVERY DAY   metFORMIN 1000 MG tablet Commonly known as: GLUCOPHAGE TAKE ONE TABLET BY MOUTH 2 TIMES A DAY WITH A MEAL.   metoprolol tartrate 50 MG tablet Commonly known as: LOPRESSOR TAKE ONE TABLET BY MOUTH 2 TIMES A DAY   mirtazapine 30 MG tablet Commonly known as: REMERON Take 1 tablet (30 mg total) by mouth at bedtime.   omeprazole 20 MG capsule Commonly known as: PRILOSEC TAKE ONE CAPSULE BY MOUTH AT BEDTIME   Pen Needles 32G X 5 MM Misc 1 each by Does not apply route daily.          Objective:   Physical Exam  No PE     Assessment & Plan:  1. Essential hypertension, benign Still not at target but no further medication changes planned at this time.   2. Type 2 diabetes mellitus not at goal [facility]) Personal finger stick readings before breakfast and before supper have been good, ranging from a low of 93 and high of 182. No symptoms of hypoglycemic episodes. No change in his therapy. Hopefully his appt with endocrinologist in 3 weeks will SGLT2 even though his A1c is not under 10 on recent reading. Instructed pt to continue checking his blood sugar everyday, alternating between AM and PM.   3. Left renal mass CAT  scan shows this is a benign lesion  4. Proteinuria, unspecified type Renal function seems to have improved. His BUN is 20 and renal creatinine is 0.99. His GFR has improved to 90.  Fu scheduled in 2 weeks. No labs.

## 2020-03-14 ENCOUNTER — Other Ambulatory Visit: Payer: Self-pay | Admitting: Internal Medicine

## 2020-03-14 DIAGNOSIS — E890 Postprocedural hypothyroidism: Secondary | ICD-10-CM

## 2020-03-14 DIAGNOSIS — Z9889 Other specified postprocedural states: Secondary | ICD-10-CM

## 2020-03-15 ENCOUNTER — Ambulatory Visit: Payer: Medicaid Other | Admitting: Internal Medicine

## 2020-03-15 ENCOUNTER — Other Ambulatory Visit: Payer: Self-pay

## 2020-03-15 DIAGNOSIS — E890 Postprocedural hypothyroidism: Secondary | ICD-10-CM

## 2020-03-15 DIAGNOSIS — E0590 Thyrotoxicosis, unspecified without thyrotoxic crisis or storm: Secondary | ICD-10-CM

## 2020-03-15 DIAGNOSIS — K219 Gastro-esophageal reflux disease without esophagitis: Secondary | ICD-10-CM

## 2020-03-15 DIAGNOSIS — E119 Type 2 diabetes mellitus without complications: Secondary | ICD-10-CM

## 2020-03-15 MED ORDER — OMEPRAZOLE 20 MG PO CPDR: 20.0000 mg | DELAYED_RELEASE_CAPSULE | Freq: Every day | ORAL | 3 refills | Status: AC

## 2020-03-15 MED ORDER — LEVOTHYROXINE SODIUM 200 MCG PO TABS: ORAL_TABLET | ORAL | 3 refills | Status: AC

## 2020-03-15 MED ORDER — LEVOTHYROXINE SODIUM 75 MCG PO TABS: 75.0000 ug | ORAL_TABLET | Freq: Every day | ORAL | 3 refills | Status: AC

## 2020-03-15 NOTE — Progress Notes (Signed)
Subjective:    Patient ID: Richard Riley., male    DOB: [birth year], 49 y.o.   MRN: [id number]  HPI  Patient is a 49 year old male presenting for a follow-up of diabetes. Patient reports he has been checking his blood sugar. This week pt recorded a high of 166 and a low of 114 fasting.  Patient reports his FU with Endocrinology group is May 18th.   Review of Systems Patient Active Problem List   Diagnosis Date Noted  . Hyperlipidemia associated with type 2 diabetes mellitus ([place]) 09/24/2018  . Medication monitoring encounter 02/11/2017  . Tobacco abuse 02/11/2017  . Essential hypertension, benign 02/11/2017  . S/P thyroidectomy 01/23/2017  . Type 2 diabetes mellitus not at goal [facility]) 10/22/2016   Allergies as of 03/15/2020   No Known Allergies     Medication List       Accurate as of Mar 15, 2020 10:11 AM. If you have any questions, ask your nurse or doctor.        atorvastatin 20 MG tablet Commonly known as: LIPITOR TAKE ONE TABLET BY MOUTH EVERY DAY   cetirizine 10 MG tablet Commonly known as: ZYRTEC TAKE ONE TABLET BY MOUTH EVERY DAY   fluticasone 50 MCG/ACT nasal spray Commonly known as: FLONASE PLACE 1 SPRAY INTO BOTH NOSTRILS 2 TIMES A DAY   gabapentin 300 MG capsule Commonly known as: NEURONTIN Take one pill twice during the day and take two pills at bedtime   hydrochlorothiazide 25 MG tablet Commonly known as: HYDRODIURIL TAKE ONE TABLET BY MOUTH EVERY DAY   insulin glargine 100 UNIT/ML Solostar Pen Commonly known as: Lantus SoloStar Start with 22 units each evening. Increase dose by 2 units every three days if morning blood sugar over 130, as directed.   Januvia 100 MG tablet Generic drug: sitaGLIPtin TAKE ONE TABLET BY MOUTH EVERY DAY   levothyroxine 75 MCG tablet Commonly known as: SYNTHROID TAKE ONE TABLET BY MOUTH EVERY DAY BEFORE BREAKFAST WITH 229mcg FOR TOTAL OF 223mcg.   levothyroxine 200 MCG tablet Commonly known as: SYNTHROID TAKE  ONE TABLET BY MOUTH EVERY DAY BEFORE BREAKFAST   lisinopril 40 MG tablet Commonly known as: ZESTRIL TAKE ONE TABLET (40 MG) BY MOUTH EVERY DAY   metFORMIN 1000 MG tablet Commonly known as: GLUCOPHAGE TAKE ONE TABLET BY MOUTH 2 TIMES A DAY WITH A MEAL.   metoprolol tartrate 50 MG tablet Commonly known as: LOPRESSOR TAKE ONE TABLET BY MOUTH 2 TIMES A DAY   mirtazapine 30 MG tablet Commonly known as: REMERON Take 1 tablet (30 mg total) by mouth at bedtime.   omeprazole 20 MG capsule Commonly known as: PRILOSEC TAKE ONE CAPSULE BY MOUTH AT BEDTIME   Pen Needles 32G X 5 MM Misc 1 each by Does not apply route daily.          Objective:   Physical Exam   No PE     Assessment & Plan:  1. Type 2 diabetes mellitus not at goal [facility]) Has been checking his sugars as requested, alternating before breakfast ans supper for the last 14 days. Reports the highest reading at 166 and lowest at 114.  Has a FU appt with Endocrinologist in a few days. Hopefully, they will be able to start his SGLT2 as a replacement for other medications, which hopefully will assist in weight reduction.  - T4 AND TSH; Future - HgB A1c; Future - UA/M w/rflx Culture, Routine; Future  2. Hyperthyroidism Wish to continue the  275 mcg per day. Fu in 3 weeks.   3. S/P thyroidectomy - levothyroxine (SYNTHROID) 75 MCG tablet; Take 1 tablet (75 mcg total) by mouth daily before breakfast.  Dispense: 90 tablet; Refill: 3 - levothyroxine (SYNTHROID) 200 MCG tablet; TAKE ONE TABLET BY MOUTH EVERY DAY BEFORE BREAKFAST  Dispense: 90 tablet; Refill: 3  4. Mild acid reflux - omeprazole (PRILOSEC) 20 MG capsule; Take 1 capsule (20 mg total) by mouth at bedtime.  Dispense: 90 capsule; Refill: 3

## 2020-03-21 ENCOUNTER — Ambulatory Visit: Payer: Medicaid Other

## 2020-03-21 ENCOUNTER — Other Ambulatory Visit: Payer: Self-pay

## 2020-03-23 ENCOUNTER — Other Ambulatory Visit: Payer: Self-pay

## 2020-03-23 ENCOUNTER — Ambulatory Visit: Payer: Medicaid Other | Admitting: Licensed Clinical Social Worker

## 2020-03-23 DIAGNOSIS — F332 Major depressive disorder, recurrent severe without psychotic features: Secondary | ICD-10-CM

## 2020-03-23 DIAGNOSIS — F411 Generalized anxiety disorder: Secondary | ICD-10-CM

## 2020-03-23 NOTE — BH Specialist Note (Signed)
  Integrated Behavioral Health Follow Up Visit Via Phone  MRN: [id number] Name: Richard Riley.   Type of Service: [place] Interpretor:No. Interpretor Name and Language: not applicable.   SUBJECTIVE: Richard Riley. is a 49 y.o. male accompanied by himself. Patient was referred by Andrey Farmer, MD for mental health.  Patient reports the following symptoms/concerns: He discussed his family and wanting to see his daughters more often. He notes that he is going to try to get his driver's license again in June. He reports that its been 9 years since he got his DWI and did not realize it. He notes that he thinks getting his driver's license back will help to improve his mood and allow him to have more independence. He notes that he spoke with Dr. Mable Fill, MD who stated that his kidney's are back to functioning at 100 % again and that has taken a lot of anxiety off of his shoulders. He notes that he cannot walk around a lot outside because he has been experiencing the pain in his legs. He explains that he has not had a whole of nervousness in the last few weeks. He reports that he continues to have nightmares on the Mirtazapine. She denies suicidal and homicidal thoughts.  Duration of problem: ; Severity of problem: moderate  OBJECTIVE: Mood: Euthymic and Affect: Appropriate Risk of harm to self or others: No plan to harm self or others  LIFE CONTEXT: Family and Social: see above. School/Work: see above. Self-Care: see above.  Life Changes: see above.  GOALS ADDRESSED: Patient will: 1.  Reduce symptoms of: insomnia  2.  Increase knowledge and/or ability of: self-management skills  3.  Demonstrate ability to: Increase healthy adjustment to current life circumstances  INTERVENTIONS: Interventions utilized:  Supportive Counseling was utilized by the clinician during today's follow up session. Clinician processed with the patient regarding how he has been  doing since the last follow up session. Clinician measured the patient's anxiety and depression on a numerical scale. Clinician explained to the patient that she would have a case consultation with Dr. Octavia Heir, MD, psychiatric consultant on Tuesday May 25th to discuss his nightmares and revisiting needing a sleep study to be examined for sleep apnea. Clinician encouraged the patient to utilize his coping skills and practice self care.  Standardized Assessments completed: GAD-7 and PHQ 9  ASSESSMENT: Patient currently experiencing see above.   Patient may benefit from see above.  PLAN: 1. Follow up with behavioral health clinician on :  2. Behavioral recommendations: see above. 3. Referral(s): [place] (In Clinic) 4. "From scale of 1-10, how likely are you to follow plan?":   Bayard Hugger, LCSW

## 2020-03-28 ENCOUNTER — Other Ambulatory Visit: Payer: Self-pay

## 2020-03-28 MED ORDER — MIRTAZAPINE 30 MG PO TABS: 30.0000 mg | ORAL_TABLET | Freq: Every day | ORAL | 2 refills | Status: DC

## 2020-03-29 ENCOUNTER — Other Ambulatory Visit: Payer: Self-pay

## 2020-03-29 ENCOUNTER — Other Ambulatory Visit: Payer: Medicaid Other

## 2020-03-29 DIAGNOSIS — E119 Type 2 diabetes mellitus without complications: Secondary | ICD-10-CM

## 2020-03-30 LAB — UA/M W/RFLX CULTURE, ROUTINE
Bilirubin, UA: NEGATIVE
Leukocytes,UA: NEGATIVE
Nitrite, UA: NEGATIVE
RBC, UA: NEGATIVE
Specific Gravity, UA: >=1.03 — AB (ref 1.005–1.030)
Urobilinogen, Ur: 0.2 mg/dL (ref 0.2–1.0)
pH, UA: 6 (ref 5.0–7.5)

## 2020-03-30 LAB — HEMOGLOBIN A1C
Est. average glucose Bld gHb Est-mCnc: 229 mg/dL
Hgb A1c MFr Bld: 9.6 % — ABNORMAL HIGH (ref 4.8–5.6)

## 2020-03-30 LAB — MICROSCOPIC EXAMINATION
Bacteria, UA: NONE SEEN
Casts: NONE SEEN /lpf
Epithelial Cells (non renal): NONE SEEN /hpf (ref 0–10)
RBC, Urine: NONE SEEN /hpf (ref 0–2)
WBC, UA: NONE SEEN /hpf (ref 0–5)

## 2020-03-30 LAB — T4 AND TSH
T4, Total: 9.3 ug/dL (ref 4.5–12.0)
TSH: 0.724 u[IU]/mL (ref 0.450–4.500)

## 2020-04-05 ENCOUNTER — Other Ambulatory Visit: Payer: Self-pay | Admitting: Internal Medicine

## 2020-04-05 ENCOUNTER — Ambulatory Visit: Payer: Medicaid Other | Admitting: Internal Medicine

## 2020-04-05 DIAGNOSIS — E119 Type 2 diabetes mellitus without complications: Secondary | ICD-10-CM

## 2020-04-11 ENCOUNTER — Ambulatory Visit: Payer: Medicaid Other | Admitting: Pharmacist

## 2020-04-11 ENCOUNTER — Other Ambulatory Visit: Payer: Self-pay | Admitting: Internal Medicine

## 2020-04-11 ENCOUNTER — Other Ambulatory Visit: Payer: Self-pay

## 2020-04-11 ENCOUNTER — Encounter: Payer: Self-pay | Admitting: Pharmacist

## 2020-04-11 DIAGNOSIS — J3089 Other allergic rhinitis: Secondary | ICD-10-CM

## 2020-04-11 NOTE — Progress Notes (Unsigned)
Medication Management Clinic Visit Note  Patient: Richard Riley. MRN: [id number] Date of Birth: 1970/12/06 PCP: Richard Millers, MD   Richard Riley. 49 y.o. male presents for a telephone medication therapy management review. Patient was verified by two identifiers.   There were no vitals taken for this visit.  Patient Information   Past Medical History:  Diagnosis Date  . Allergy   . GERD (gastroesophageal reflux disease)   . Heart murmur   . Hypertension   . PONV (postoperative nausea and vomiting)    mild N/V after surgery 30 years ago  . Sleep apnea   . Thyroid disease   . Tobacco abuse 02/11/2017  . Type 2 diabetes mellitus not at goal [facility]) 10/22/2016      Past Surgical History:  Procedure Laterality Date  . FEMUR FRACTURE SURGERY  child  . THYROIDECTOMY N/A 01/23/2017   Procedure: THYROIDECTOMY;  Surgeon: Richard Husbands, MD;  Location: [facility] ORS;  Service: General;  Laterality: N/A;  . TOTAL THYROIDECTOMY  01/16/2017     Family History  Problem Relation Age of Onset  . Hypertension Mother   . Hypothyroidism Mother   . Diabetes Father      Family Support: Good  Lifestyle Diet: Breakfast:*** Lunch:*** Dinner:*** Drinks:***       Exercise limited by: Other - see comments(neuropathy)    Social History   Substance and Sexual Activity  Alcohol Use Not Currently   Comment: last drink was a little less than a year ago per the pt      Social History   Tobacco Use  Smoking Status Former Smoker  . Packs/day: 0.10  . Types: Cigarettes  . Quit date: 09/10/2015  . Years since quitting: 4.5  Smokeless Tobacco Current User  . Types: Snuff      Health Maintenance  Topic Date Due  . Hepatitis C Screening  Never done  . PNEUMOCOCCAL POLYSACCHARIDE VACCINE AGE 61-64 HIGH RISK  Never done  . FOOT EXAM  Never done  . OPHTHALMOLOGY EXAM  Never done  . TETANUS/TDAP  Never done  . INFLUENZA VACCINE  06/04/2020  . HEMOGLOBIN A1C  09/29/2020  . COVID-19  Vaccine  Completed  . HIV Screening  Completed   Health Maintenance/Date Completed  Last ED visit: *** Last Visit to PCP: *** Next Visit to PCP: *** Specialist Visit: *** Dental Exam: *** Eye Exam: *** Prostate Exam: *** Pelvic/PAP Exam: *** Mammogram: *** DEXA: *** Colonoscopy: *** Flu Vaccine: *** Pneumonia Vaccine: *** COVID-19 Vaccine: *** Shingrix Vaccine: ***   Outpatient Encounter Medications as of 04/11/2020  Medication Sig  . amLODipine (NORVASC) 5 MG tablet Take 5 mg by mouth daily.  Marland Kitchen aspirin EC 81 MG tablet Take 81 mg by mouth daily.  Marland Kitchen atorvastatin (LIPITOR) 20 MG tablet TAKE ONE TABLET BY MOUTH EVERY DAY  . cetirizine (ZYRTEC) 10 MG tablet TAKE ONE TABLET BY MOUTH EVERY DAY  . fluticasone (FLONASE) 50 MCG/ACT nasal spray PLACE 1 SPRAY INTO BOTH NOSTRILS 2 TIMES A DAY  . gabapentin (NEURONTIN) 300 MG capsule Take one pill twice during the day and take two pills at bedtime  . hydrochlorothiazide (HYDRODIURIL) 25 MG tablet TAKE ONE TABLET BY MOUTH EVERY DAY  . Insulin Glargine (LANTUS SOLOSTAR) 100 UNIT/ML Solostar Pen Start with 22 units each evening. Increase dose by 2 units every three days if morning blood sugar over 130, as directed. (Patient taking differently: 50 Units. Start with 22 units each evening. Increase dose by 2  units every three days if morning blood sugar over 130, as directed.)  . Insulin Pen Needle (PEN NEEDLES) 32G X 5 MM MISC 1 each by Does not apply route daily.  Marland Kitchen JANUVIA 100 MG tablet TAKE ONE TABLET BY MOUTH EVERY DAY  . levothyroxine (SYNTHROID) 200 MCG tablet TAKE ONE TABLET BY MOUTH EVERY DAY BEFORE BREAKFAST  . levothyroxine (SYNTHROID) 75 MCG tablet Take 1 tablet (75 mcg total) by mouth daily before breakfast.  . lisinopril (ZESTRIL) 40 MG tablet TAKE ONE TABLET (40 MG) BY MOUTH EVERY DAY  . metFORMIN (GLUCOPHAGE) 1000 MG tablet TAKE ONE TABLET BY MOUTH 2 TIMES A DAY WITH A MEAL.  . metoprolol tartrate (LOPRESSOR) 50 MG tablet TAKE ONE  TABLET BY MOUTH 2 TIMES A DAY  . mirtazapine (REMERON) 30 MG tablet Take 1 tablet (30 mg total) by mouth at bedtime.  Marland Kitchen omeprazole (PRILOSEC) 20 MG capsule Take 1 capsule (20 mg total) by mouth at bedtime.  . [DISCONTINUED] fluticasone (FLONASE) 50 MCG/ACT nasal spray PLACE 1 SPRAY INTO BOTH NOSTRILS 2 TIMES A DAY   No facility-administered encounter medications on file as of 04/11/2020.    Assessment and Plan: DM:  Checks blood sugar alternating morning and evening- (1 time per day) Albuminuria suggestive of nephropathy. Followed by [facility] in [place]. Neuropathy:  legs, feet, hands Not sleeping well due to pain and possible sleep apnea Hypertension: Hyperlipidemia: GERD: Thyroidectomy: Allergies:  RTC 6 months  Kyndal Heringer K. [name], PharmD Medication Management Clinic [place] Operations Coordinator [phone]

## 2020-04-12 ENCOUNTER — Ambulatory Visit: Payer: Medicaid Other | Admitting: Internal Medicine

## 2020-04-12 ENCOUNTER — Encounter: Payer: Self-pay | Admitting: Internal Medicine

## 2020-04-12 DIAGNOSIS — E1165 Type 2 diabetes mellitus with hyperglycemia: Secondary | ICD-10-CM

## 2020-04-12 DIAGNOSIS — E119 Type 2 diabetes mellitus without complications: Secondary | ICD-10-CM

## 2020-04-12 MED ORDER — DAPAGLIFLOZIN PROPANEDIOL 5 MG PO TABS: 5.0000 mg | ORAL_TABLET | Freq: Every day | ORAL | 0 refills | Status: DC

## 2020-04-12 NOTE — Progress Notes (Signed)
Established Patient Office Visit  Subjective:  Patient ID: Richard Harrower., male    DOB: May 13, 1971  Age: 49 y.o. MRN: [id number]  CC:  Chief Complaint  Patient presents with   Diabetes    HPI Richard Chayne Baumgart. presents for followup on labs.  Past Medical History:  Diagnosis Date   Allergy    GERD (gastroesophageal reflux disease)    Heart murmur    Hypertension    PONV (postoperative nausea and vomiting)    mild N/V after surgery 30 years ago   Sleep apnea    Thyroid disease    Tobacco abuse 02/11/2017   Type 2 diabetes mellitus not at goal [facility]) 10/22/2016    Past Surgical History:  Procedure Laterality Date   FEMUR FRACTURE SURGERY  child   THYROIDECTOMY N/A 01/23/2017   Procedure: THYROIDECTOMY;  Surgeon: Jules Husbands, MD;  Location: [facility] ORS;  Service: General;  Laterality: N/A;   TOTAL THYROIDECTOMY  01/16/2017    Family History  Problem Relation Age of Onset   Hypertension Mother    Hypothyroidism Mother    Diabetes Father     Social History   Socioeconomic History   Marital status: Single    Spouse name: Not on file   Number of children: 3   Years of education: Not on file   Highest education level: Not on file  Occupational History   Not on file  Tobacco Use   Smoking status: Former Smoker    Packs/day: 0.10    Types: Cigarettes    Quit date: 09/10/2015    Years since quitting: 4.5   Smokeless tobacco: Current User    Types: Snuff  Substance and Sexual Activity   Alcohol use: Not Currently    Comment: last drink was a little less than a year ago per the pt   Drug use: No   Sexual activity: Yes    Birth control/protection: Condom  Other Topics Concern   Not on file  Social History Narrative   Social determinant screening completed. Patient has applied for disability and is awaiting a response. He does not require a referral at this point in time.   Social Determinants of Health   Financial Resource Strain:  Medium Risk   Difficulty of Paying Living Expenses: Somewhat hard  Food Insecurity: No Food Insecurity   Worried About Charity fundraiser in the Last Year: Never true   Ran Out of Food in the Last Year: Never true  Transportation Needs: No Transportation Needs   Lack of Transportation (Medical): No   Lack of Transportation (Non-Medical): No  Physical Activity: Insufficiently Active   Days of Exercise per Week: 5 days   Minutes of Exercise per Session: 20 min  Stress: Stress Concern Present   Feeling of Stress : To some extent  Social Connections: Somewhat Isolated   Frequency of Communication with Friends and Family: Three times a week   Frequency of Social Gatherings with Friends and Family: Three times a week   Attends Religious Services: 1 to 4 times per year   Active Member of Clubs or Organizations: No   Attends Archivist Meetings: Never   Marital Status: Divorced  Human resources officer Violence: Not At Risk   Fear of Current or Ex-Partner: No   Emotionally Abused: No   Physically Abused: No   Sexually Abused: No    Outpatient Medications Prior to Visit  Medication Sig Dispense Refill   amLODipine (NORVASC) 5 MG tablet Take  5 mg by mouth daily.     aspirin EC 81 MG tablet Take 81 mg by mouth daily.     atorvastatin (LIPITOR) 20 MG tablet TAKE ONE TABLET BY MOUTH EVERY DAY 90 tablet 0   cetirizine (ZYRTEC) 10 MG tablet TAKE ONE TABLET BY MOUTH EVERY DAY 90 tablet 0   fluticasone (FLONASE) 50 MCG/ACT nasal spray PLACE 1 SPRAY INTO BOTH NOSTRILS 2 TIMES A DAY 16 g 0   gabapentin (NEURONTIN) 300 MG capsule Take one pill twice during the day and take two pills at bedtime     hydrochlorothiazide (HYDRODIURIL) 25 MG tablet TAKE ONE TABLET BY MOUTH EVERY DAY 90 tablet 0   Insulin Glargine (LANTUS SOLOSTAR) 100 UNIT/ML Solostar Pen Start with 22 units each evening. Increase dose by 2 units every three days if morning blood sugar over 130, as directed.  (Patient taking differently: 40 Units. Start with 22 units each evening. Increase dose by 2 units every three days if morning blood sugar over 130, as directed.) 15 mL 11   Insulin Pen Needle (PEN NEEDLES) 32G X 5 MM MISC 1 each by Does not apply route daily. 50 each 5   levothyroxine (SYNTHROID) 200 MCG tablet TAKE ONE TABLET BY MOUTH EVERY DAY BEFORE BREAKFAST 90 tablet 3   levothyroxine (SYNTHROID) 75 MCG tablet Take 1 tablet (75 mcg total) by mouth daily before breakfast. 90 tablet 3   lisinopril (ZESTRIL) 40 MG tablet TAKE ONE TABLET (40 MG) BY MOUTH EVERY DAY 90 tablet 0   metFORMIN (GLUCOPHAGE) 1000 MG tablet TAKE ONE TABLET BY MOUTH 2 TIMES A DAY WITH A MEAL. 180 tablet 0   metoprolol tartrate (LOPRESSOR) 50 MG tablet TAKE ONE TABLET BY MOUTH 2 TIMES A DAY 60 tablet 0   mirtazapine (REMERON) 30 MG tablet Take 1 tablet (30 mg total) by mouth at bedtime. 30 tablet 2   omeprazole (PRILOSEC) 20 MG capsule Take 1 capsule (20 mg total) by mouth at bedtime. 90 capsule 3   JANUVIA 100 MG tablet TAKE ONE TABLET BY MOUTH EVERY DAY 90 tablet 0   No facility-administered medications prior to visit.    No Known Allergies  ROS Review of Systems    Objective:    Physical Exam  There were no vitals taken for this visit. Wt Readings from Last 3 Encounters:  03/29/20 269 lb 3.2 oz (122.1 kg)  09/01/19 275 lb 14.4 oz (125.1 kg)  12/23/18 267 lb 4.8 oz (121.2 kg)     Health Maintenance Due  Topic Date Due   Hepatitis C Screening  Never done   PNEUMOCOCCAL POLYSACCHARIDE VACCINE AGE 10-64 HIGH RISK  Never done   FOOT EXAM  Never done   OPHTHALMOLOGY EXAM  Never done   TETANUS/TDAP  Never done    There are no preventive care reminders to display for this patient.  Lab Results  Component Value Date   TSH 0.724 03/29/2020   Lab Results  Component Value Date   WBC 10.1 10/06/2019   HGB 16.0 10/06/2019   HCT 50.9 10/06/2019   MCV 87 10/06/2019   PLT 190 10/06/2019    Lab Results  Component Value Date   NA 136 02/02/2020   K 3.9 02/02/2020   CO2 21 02/02/2020   GLUCOSE 441 (H) 02/02/2020   BUN 20 02/02/2020   CREATININE 0.99 02/02/2020   BILITOT 1.1 12/29/2019   ALKPHOS 99 12/29/2019   AST 37 12/29/2019   ALT 47 (H) 12/29/2019   PROT  8.0 12/29/2019   PROT 7.8 12/29/2019   ALBUMIN 4.9 12/29/2019   CALCIUM 9.6 02/02/2020   ANIONGAP 8 02/06/2017   Lab Results  Component Value Date   CHOL 136 10/06/2019   Lab Results  Component Value Date   HDL 24 (L) 10/06/2019   Lab Results  Component Value Date   LDLCALC 21 10/06/2019   Lab Results  Component Value Date   TRIG 706 ([place]) 10/06/2019   Lab Results  Component Value Date   CHOLHDL 5.7 (H) 10/06/2019   Lab Results  Component Value Date   HGBA1C 9.6 (H) 03/29/2020      Assessment & Plan:   1. Type 2 diabetes mellitus not at goal [facility]) Stop Januvia Start Farxiga 5mg  daily Reduce Lantus to 40mg  nightly    Follow-up:  Labs in 3 weeks: Cmet and A1c Phone visit July 7    Richard Riley  [place], [place]

## 2020-04-13 ENCOUNTER — Ambulatory Visit: Payer: Medicaid Other | Admitting: Licensed Clinical Social Worker

## 2020-04-19 ENCOUNTER — Encounter: Payer: Self-pay | Admitting: Internal Medicine

## 2020-04-20 ENCOUNTER — Ambulatory Visit: Payer: Medicaid Other | Admitting: Licensed Clinical Social Worker

## 2020-04-20 ENCOUNTER — Other Ambulatory Visit: Payer: Self-pay

## 2020-04-20 DIAGNOSIS — F332 Major depressive disorder, recurrent severe without psychotic features: Secondary | ICD-10-CM

## 2020-04-20 DIAGNOSIS — F411 Generalized anxiety disorder: Secondary | ICD-10-CM

## 2020-04-20 NOTE — BH Specialist Note (Signed)
Integrated Behavioral Health Follow Up Visit Via Phone  MRN: [id number] Name: Richard Riley.  Type of Service: [place] Interpretor:No. Interpretor Name and Language: not applicable.   SUBJECTIVE: Richard Riley. is a 49 y.o. male accompanied by himself. Patient was referred by Andrey Farmer, MD for mental health.  Patient reports the following symptoms/concerns: He notes that this week in particular that he has been getting back to sadness and shame. He notes that for the past two weeks he has been texting with his kids to see if they could come visit. He reports that he is working to get his license back but had two tickets from 2004 that he did not go to court for and did not pay. He notes that he paid off the tickets and has had insurance for over a month. He reports that he is hoping to get his license back [place]. He reports that he finally got into in touch with the IRS to find out about his stimulus payments. He notes that he finally got an attorney to accept his case and work with the appeal of the disability process. He notes that another positive is that he has been started on a new diabetes medication. He notes that things are looking up in terms of getting his license, pending disability, changes with his medication, stimulus checks, and getting a vehicle. He notes that he is continuing to walk ten minutes twice a day. He describes having shooting pains on the left middle of his chest where his heart would be and worries him quite a bit. He denies suicidal and homicidal thoughts.  Duration of problem: ; Severity of problem: moderate  OBJECTIVE: Mood: Euthymic and Affect: Appropriate Risk of harm to self or others: No plan to harm self or others  LIFE CONTEXT: Family and Social: see above.  School/Work: see above. Self-Care: see above. Life Changes: see above.   GOALS ADDRESSED: Patient will: 1.  Reduce symptoms of: anxiety and depression   2.  Increase knowledge and/or ability of: healthy habits and self-management skills  3.  Demonstrate ability to: Increase healthy adjustment to current life circumstances  INTERVENTIONS: Interventions utilized:  Supportive Counseling was utilized by the clinician during today's follow up session. Clinician processed with the patient regarding how he has been doing since the last follow up session. Clinician measured the patient's anxiety and depression on a numerical scale. Clinician discussed with the patient regarding how things have been going with his family. Clinician processed with the patient regarding that she thinks that his mood will improve once he is able to get his license back and will actually be able to not have to depend on others for transportation. Clinician explained to the patient that it sounds like he has a more positive outlook on things and has taken some problems into his own hands to solve them.  Standardized Assessments completed: GAD-7 and PHQ 9  ASSESSMENT: Patient currently experiencing see above.   Patient may benefit from see above.  PLAN: 1. Follow up with behavioral health clinician on :  2. Behavioral recommendations: see above.  3. Referral(s): [name] (In Clinic) 4. "From scale of 1-10, how likely are you to follow plan?":   Bayard Hugger, LCSW

## 2020-04-25 ENCOUNTER — Other Ambulatory Visit: Payer: Self-pay

## 2020-04-25 MED ORDER — MIRTAZAPINE 15 MG PO TABS
ORAL_TABLET | ORAL | 0 refills | Status: DC
Start: 2020-04-25 — End: 2020-08-31

## 2020-04-25 MED ORDER — MIRTAZAPINE 7.5 MG PO TABS
ORAL_TABLET | ORAL | 0 refills | Status: DC
Start: 2020-04-25 — End: 2020-08-31

## 2020-05-02 ENCOUNTER — Other Ambulatory Visit: Payer: Self-pay

## 2020-05-02 DIAGNOSIS — E119 Type 2 diabetes mellitus without complications: Secondary | ICD-10-CM

## 2020-05-03 ENCOUNTER — Other Ambulatory Visit: Payer: Medicaid Other

## 2020-05-03 ENCOUNTER — Other Ambulatory Visit: Payer: Self-pay

## 2020-05-03 DIAGNOSIS — E1165 Type 2 diabetes mellitus with hyperglycemia: Secondary | ICD-10-CM

## 2020-05-04 ENCOUNTER — Ambulatory Visit: Payer: Medicaid Other | Admitting: Licensed Clinical Social Worker

## 2020-05-04 LAB — HEMOGLOBIN A1C
Est. average glucose Bld gHb Est-mCnc: 183 mg/dL
Hgb A1c MFr Bld: 8 % — ABNORMAL HIGH (ref 4.8–5.6)

## 2020-05-10 ENCOUNTER — Ambulatory Visit: Payer: Medicaid Other | Admitting: Internal Medicine

## 2020-05-10 ENCOUNTER — Other Ambulatory Visit: Payer: Self-pay | Admitting: Internal Medicine

## 2020-05-10 ENCOUNTER — Other Ambulatory Visit: Payer: Self-pay

## 2020-05-10 ENCOUNTER — Encounter: Payer: Self-pay | Admitting: Internal Medicine

## 2020-05-10 DIAGNOSIS — E119 Type 2 diabetes mellitus without complications: Secondary | ICD-10-CM

## 2020-05-10 DIAGNOSIS — J3089 Other allergic rhinitis: Secondary | ICD-10-CM

## 2020-05-10 DIAGNOSIS — E890 Postprocedural hypothyroidism: Secondary | ICD-10-CM

## 2020-05-10 DIAGNOSIS — R079 Chest pain, unspecified: Secondary | ICD-10-CM | POA: Insufficient documentation

## 2020-05-10 DIAGNOSIS — J019 Acute sinusitis, unspecified: Secondary | ICD-10-CM

## 2020-05-10 MED ORDER — FLUTICASONE PROPIONATE 50 MCG/ACT NA SUSP: NASAL | 12 refills | Status: AC

## 2020-05-10 MED ORDER — DAPAGLIFLOZIN PROPANEDIOL 10 MG PO TABS: 10.0000 mg | ORAL_TABLET | Freq: Every day | ORAL | 3 refills | Status: DC

## 2020-05-10 MED ORDER — SULFAMETHOXAZOLE-TRIMETHOPRIM 800-160 MG PO TABS: 1.0000 | ORAL_TABLET | Freq: Two times a day (BID) | ORAL | 0 refills | Status: AC
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
## 2020-05-17 ENCOUNTER — Other Ambulatory Visit: Payer: Self-pay | Admitting: Internal Medicine

## 2020-05-17 DIAGNOSIS — E0590 Thyrotoxicosis, unspecified without thyrotoxic crisis or storm: Secondary | ICD-10-CM

## 2020-05-19 ENCOUNTER — Other Ambulatory Visit: Payer: Self-pay | Admitting: Internal Medicine

## 2020-05-19 DIAGNOSIS — E0590 Thyrotoxicosis, unspecified without thyrotoxic crisis or storm: Secondary | ICD-10-CM

## 2020-05-23 ENCOUNTER — Ambulatory Visit: Payer: Medicaid Other

## 2020-05-23 ENCOUNTER — Other Ambulatory Visit: Payer: Self-pay

## 2020-05-23 NOTE — Progress Notes (Incomplete)
05/03/2020: 8.0 (down from 9.6 in 03/29/2020 and 12.5 in 02/02/2020) UACR of 267 on 10/06/2019   Richard Riley and I spoke with Richard Riley over the phone. He notes that he  has increased his Lantus recently to target better glucose control. Recommended increase of Lantus by 2U every 3 days to target BG >150. Once BG consistently less than 150, he should remain at a consistent Lantus dose.  Regarding adjunctive therapy, given nephropathy, he would significantly benefit from SGLT2i, but he should not start until HbA1c <10%. If able, would improve glycemia with Lantus. Ideally, he would stop Januvia and start VOH6WV  (Ozempic, Trulicity, or Liraglutide).  GLP1-RA have excellent cardiovascular outcomes data and SGLT2i have excellent cardiovascular and renal outcomes data. It is possible that if he were to start Osino and SGLT2i that he could reduce insulin fairly dramatically.  Strongly recommended that he be seen by ophthalmology.  Plan: - Continue metformin - Increase Lantus to target FBG <150 - When HbA1c <10%, start SGLT2i - If possible, exchange Januvia for PXT0GY - Recommended follow-up with ophtho and nephro  Follow up Diabetes/ Endocrine [facility]     Patient ID: Richard Leber., male   DOB: 10-03-1971, 49 y.o.   MRN: [id number] Assessment:  Richard Yeiden Frenkel. is a 49 y.o. male who is seen in follow up for No primary diagnosis found. at the request of Tawni Millers, MD.  Encounter Diagnoses No diagnosis found.  Assessment    Plan:        There are no Patient Instructions on file for this visit.   No orders of the defined types were placed in this encounter.    Subjective:  HPI   Review of Systems  Richard Jeramey Lanuza.  has a past medical history of Allergy, GERD (gastroesophageal reflux disease), Heart murmur, Hypertension, PONV (postoperative nausea and vomiting), Sleep apnea, Thyroid disease, Tobacco abuse (02/11/2017), and Type 2 diabetes mellitus not at goal [facility])  (10/22/2016).  Family History, Social History, current Medications and allergies reviewed and updated in Epic.  Objective:    There were no vitals taken for this visit. Physical Exam      Data : I have personally reviewed pertinent labs and imaging studies, if indicated,  with the patient in clinic today.   Lab Orders  No laboratory test(s) ordered today    HC Readings from Last 3 Encounters:  No data found for [facility]    Wt Readings from Last 3 Encounters:  05/04/20 263 lb (119.3 kg)  03/29/20 269 lb 3.2 oz (122.1 kg)  09/01/19 275 lb 14.4 oz (125.1 kg)

## 2020-05-24 ENCOUNTER — Telehealth: Payer: Self-pay | Admitting: Pharmacist

## 2020-05-24 ENCOUNTER — Telehealth: Payer: Self-pay | Admitting: Licensed Clinical Social Worker

## 2020-05-24 NOTE — Telephone Encounter (Signed)
05/24/2020 11:40:12 AM - [facility] Glade 10mg  dose increase script to dr  -- Elmer Picker - Wednesday, May 24, 2020 11:38 AM --Received  a pharmacy printout from [place] 10mg  Take one tablet by mouth every day. (previously on 5mg ). I have printed a script and will send to [facility] for Dr. Mable Fill to sign & return, I will then fax to [facility]- with letter showing patient not eligible for Medicaid in [place].

## 2020-05-25 ENCOUNTER — Telehealth: Payer: Self-pay | Admitting: Pharmacist

## 2020-05-25 NOTE — Telephone Encounter (Signed)
Called left message with contact information for client to reschedule his missed appointment.

## 2020-05-25 NOTE — Telephone Encounter (Signed)
05/25/2020 10:47:48 AM - Lantus Solostar refill to [place] - Thursday, May 25, 2020 10:46 AM --Virgel Gess [employer] refill request for Lantus Solostar Max Inject 30 units daily #3 boxes.

## 2020-06-06 ENCOUNTER — Telehealth: Payer: Self-pay | Admitting: Licensed Clinical Social Worker

## 2020-06-06 ENCOUNTER — Telehealth: Payer: Self-pay | Admitting: Pharmacist

## 2020-06-06 NOTE — Telephone Encounter (Signed)
06/06/2020 2:49:17 PM - Wilder Glade script dose increase to [place] - Tuesday, June 06, 2020 2:48 PM --Faxed script for Dose Increase--Farxiga 10mg  Take one tablet by mouth every day--previously 5mg .

## 2020-06-06 NOTE — Telephone Encounter (Signed)
Left Voicemail message for patient to call and reschedule missed appointment with Julian Hy, LCSW

## 2020-06-14 ENCOUNTER — Other Ambulatory Visit: Payer: Medicaid Other

## 2020-06-14 ENCOUNTER — Other Ambulatory Visit: Payer: Self-pay

## 2020-06-14 VITALS — BP 132/80 | HR 73 | Temp 97.7°F | Ht 72.0 in | Wt 259.3 lb

## 2020-06-14 DIAGNOSIS — E890 Postprocedural hypothyroidism: Secondary | ICD-10-CM

## 2020-06-15 LAB — COMPREHENSIVE METABOLIC PANEL
ALT: 26 IU/L (ref 0–44)
AST: 19 IU/L (ref 0–40)
Albumin/Globulin Ratio: 1.6 (ref 1.2–2.2)
Albumin: 4.5 g/dL (ref 4.0–5.0)
Alkaline Phosphatase: 109 IU/L (ref 48–121)
BUN/Creatinine Ratio: 37 — ABNORMAL HIGH (ref 9–20)
BUN: 34 mg/dL — ABNORMAL HIGH (ref 6–24)
Bilirubin Total: 0.8 mg/dL (ref 0.0–1.2)
CO2: 23 mmol/L (ref 20–29)
Calcium: 9.4 mg/dL (ref 8.7–10.2)
Chloride: 94 mmol/L — ABNORMAL LOW (ref 96–106)
Creatinine, Ser: 0.93 mg/dL (ref 0.76–1.27)
GFR calc Af Amer: 112 mL/min/{1.73_m2} (ref >59)
GFR calc non Af Amer: 97 mL/min/{1.73_m2} (ref >59)
Globulin, Total: 2.8 g/dL (ref 1.5–4.5)
Glucose: 315 mg/dL — ABNORMAL HIGH (ref 65–99)
Potassium: 4.4 mmol/L (ref 3.5–5.2)
Sodium: 132 mmol/L — ABNORMAL LOW (ref 134–144)
Total Protein: 7.3 g/dL (ref 6.0–8.5)

## 2020-06-15 LAB — CBC WITH DIFFERENTIAL/PLATELET
Basophils Absolute: 0.1 10*3/uL (ref 0.0–0.2)
Basos: 1 %
EOS (ABSOLUTE): 0.5 10*3/uL — ABNORMAL HIGH (ref 0.0–0.4)
Eos: 5 %
Hematocrit: 46.6 % (ref 37.5–51.0)
Hemoglobin: 16.1 g/dL (ref 13.0–17.7)
Immature Grans (Abs): 0 10*3/uL (ref 0.0–0.1)
Immature Granulocytes: 0 %
Lymphocytes Absolute: 3 10*3/uL (ref 0.7–3.1)
Lymphs: 29 %
MCH: 30.4 pg (ref 26.6–33.0)
MCHC: 34.5 g/dL (ref 31.5–35.7)
MCV: 88 fL (ref 79–97)
Monocytes Absolute: 0.7 10*3/uL (ref 0.1–0.9)
Monocytes: 7 %
Neutrophils Absolute: 5.9 10*3/uL (ref 1.4–7.0)
Neutrophils: 58 %
Platelets: 169 10*3/uL (ref 150–450)
RBC: 5.3 x10E6/uL (ref 4.14–5.80)
RDW: 13.2 % (ref 11.6–15.4)
WBC: 10.2 10*3/uL (ref 3.4–10.8)

## 2020-06-15 LAB — T4: T4, Total: 10.7 ug/dL (ref 4.5–12.0)

## 2020-06-15 LAB — HEMOGLOBIN A1C
Est. average glucose Bld gHb Est-mCnc: 180 mg/dL
Hgb A1c MFr Bld: 7.9 % — ABNORMAL HIGH (ref 4.8–5.6)

## 2020-06-15 LAB — TSH: TSH: 0.582 u[IU]/mL (ref 0.450–4.500)

## 2020-06-21 ENCOUNTER — Ambulatory Visit: Payer: Self-pay | Admitting: Internal Medicine

## 2020-06-21 ENCOUNTER — Other Ambulatory Visit: Payer: Self-pay

## 2020-06-21 DIAGNOSIS — E119 Type 2 diabetes mellitus without complications: Secondary | ICD-10-CM

## 2020-06-21 DIAGNOSIS — I10 Essential (primary) hypertension: Secondary | ICD-10-CM

## 2020-06-21 DIAGNOSIS — E1169 Type 2 diabetes mellitus with other specified complication: Secondary | ICD-10-CM

## 2020-06-21 NOTE — Progress Notes (Signed)
Established Patient Office Visit  Subjective:  Patient ID: Richard Riley., male    DOB: 03-13-1971  Age: 49 y.o. MRN: [id number]  CC:  Chief Complaint  Patient presents with  . Diabetes    HPI [employer]. presents for 6wk fu. Pt reports doing well, reports weight fluctuations.   Past Medical History:  Diagnosis Date  . Allergy   . GERD (gastroesophageal reflux disease)   . Heart murmur   . Hypertension   . PONV (postoperative nausea and vomiting)    mild N/V after surgery 30 years ago  . Sleep apnea   . Thyroid disease   . Tobacco abuse 02/11/2017  . Type 2 diabetes mellitus not at goal [facility]) 10/22/2016    Past Surgical History:  Procedure Laterality Date  . FEMUR FRACTURE SURGERY  child  . THYROIDECTOMY N/A 01/23/2017   Procedure: THYROIDECTOMY;  Surgeon: Jules Husbands, MD;  Location: [facility] ORS;  Service: General;  Laterality: N/A;  . TOTAL THYROIDECTOMY  01/16/2017    Family History  Problem Relation Age of Onset  . Hypertension Mother   . Hypothyroidism Mother   . Diabetes Father     Social History   Socioeconomic History  . Marital status: Single    Spouse name: Not on file  . Number of children: 3  . Years of education: Not on file  . Highest education level: Not on file  Occupational History  . Not on file  Tobacco Use  . Smoking status: Former Smoker    Packs/day: 0.10    Types: Cigarettes    Quit date: 09/10/2015    Years since quitting: 4.7  . Smokeless tobacco: Current User    Types: Snuff  Vaping Use  . Vaping Use: Never used  Substance and Sexual Activity  . Alcohol use: Not Currently    Comment: last drink was a little less than a year ago per the pt  . Drug use: No  . Sexual activity: Yes    Birth control/protection: Condom  Other Topics Concern  . Not on file  Social History Narrative   Social determinant screening completed. Patient has applied for disability and is awaiting a response. He does not require a referral at this  point in time.   Social Determinants of Health   Financial Resource Strain: Medium Risk  . Difficulty of Paying Living Expenses: Somewhat hard  Food Insecurity: No Food Insecurity  . Worried About Charity fundraiser in the Last Year: Never true  . Ran Out of Food in the Last Year: Never true  Transportation Needs: No Transportation Needs  . Lack of Transportation (Medical): No  . Lack of Transportation (Non-Medical): No  Physical Activity: Sufficiently Active  . Days of Exercise per Week: 7 days  . Minutes of Exercise per Session: 140 min  Stress: Stress Concern Present  . Feeling of Stress : To some extent  Social Connections: Moderately Isolated  . Frequency of Communication with Friends and Family: Three times a week  . Frequency of Social Gatherings with Friends and Family: Three times a week  . Attends Religious Services: 1 to 4 times per year  . Active Member of Clubs or Organizations: No  . Attends Archivist Meetings: Never  . Marital Status: Divorced  Human resources officer Violence: Not At Risk  . Fear of Current or Ex-Partner: No  . Emotionally Abused: No  . Physically Abused: No  . Sexually Abused: No    Outpatient Medications  Prior to Visit  Medication Sig Dispense Refill  . amLODipine (NORVASC) 5 MG tablet Take 5 mg by mouth daily.    Marland Kitchen aspirin EC 81 MG tablet Take 81 mg by mouth daily.    Marland Kitchen atorvastatin (LIPITOR) 20 MG tablet TAKE ONE TABLET BY MOUTH EVERY DAY 90 tablet 0  . cetirizine (ZYRTEC) 10 MG tablet TAKE ONE TABLET BY MOUTH EVERY DAY 90 tablet 0  . dapagliflozin propanediol (FARXIGA) 10 MG TABS tablet Take 1 tablet (10 mg total) by mouth daily. 90 tablet 3  . fluticasone (FLONASE) 50 MCG/ACT nasal spray 2 puffs each nostril dailyl 16 g 12  . gabapentin (NEURONTIN) 300 MG capsule Take one pill twice during the day and take two pills at bedtime    . hydrochlorothiazide (HYDRODIURIL) 25 MG tablet TAKE ONE TABLET BY MOUTH EVERY DAY 90 tablet 0  .  Insulin Glargine (LANTUS SOLOSTAR) 100 UNIT/ML Solostar Pen Start with 22 units each evening. Increase dose by 2 units every three days if morning blood sugar over 130, as directed. (Patient taking differently: 40 Units. Start with 22 units each evening. Increase dose by 2 units every three days if morning blood sugar over 130, as directed.) 15 mL 11  . Insulin Pen Needle (PEN NEEDLES) 32G X 5 MM MISC 1 each by Does not apply route daily. 50 each 5  . levothyroxine (SYNTHROID) 200 MCG tablet TAKE ONE TABLET BY MOUTH EVERY DAY BEFORE BREAKFAST 90 tablet 3  . levothyroxine (SYNTHROID) 75 MCG tablet Take 1 tablet (75 mcg total) by mouth daily before breakfast. 90 tablet 3  . lisinopril (ZESTRIL) 40 MG tablet TAKE ONE TABLET (40 MG) BY MOUTH EVERY DAY 90 tablet 0  . metFORMIN (GLUCOPHAGE) 1000 MG tablet TAKE ONE TABLET BY MOUTH 2 TIMES A DAY WITH A MEAL. 180 tablet 0  . metoprolol tartrate (LOPRESSOR) 50 MG tablet TAKE ONE TABLET BY MOUTH 2 TIMES A DAY 120 tablet 0  . mirtazapine (REMERON) 15 MG tablet Take 22.5 mg by mouth at bedtime. 30 tablet 0  . mirtazapine (REMERON) 7.5 MG tablet Take 22.5 mg by mouth at bedtime. 30 tablet 0  . omeprazole (PRILOSEC) 20 MG capsule Take 1 capsule (20 mg total) by mouth at bedtime. 90 capsule 3  . sulfamethoxazole-trimethoprim (BACTRIM DS) 800-160 MG tablet Take 1 tablet by mouth 2 (two) times daily. 14 tablet 0   No facility-administered medications prior to visit.    No Known Allergies  ROS Review of Systems    Objective:    Physical Exam  There were no vitals taken for this visit. Wt Readings from Last 3 Encounters:  06/14/20 259 lb 4.8 oz (117.6 kg)  05/04/20 263 lb (119.3 kg)  03/29/20 269 lb 3.2 oz (122.1 kg)     Health Maintenance Due  Topic Date Due  . Hepatitis C Screening  Never done  . PNEUMOCOCCAL POLYSACCHARIDE VACCINE AGE 28-64 HIGH RISK  Never done  . FOOT EXAM  Never done  . OPHTHALMOLOGY EXAM  Never done  . TETANUS/TDAP  Never  done  . INFLUENZA VACCINE  06/04/2020    There are no preventive care reminders to display for this patient.  Lab Results  Component Value Date   TSH 0.582 06/14/2020   Lab Results  Component Value Date   WBC 10.2 06/14/2020   HGB 16.1 06/14/2020   HCT 46.6 06/14/2020   MCV 88 06/14/2020   PLT 169 06/14/2020   Lab Results  Component Value Date  NA 132 (L) 06/14/2020   K 4.4 06/14/2020   CO2 23 06/14/2020   GLUCOSE 315 (H) 06/14/2020   BUN 34 (H) 06/14/2020   CREATININE 0.93 06/14/2020   BILITOT 0.8 06/14/2020   ALKPHOS 109 06/14/2020   AST 19 06/14/2020   ALT 26 06/14/2020   PROT 7.3 06/14/2020   ALBUMIN 4.5 06/14/2020   CALCIUM 9.4 06/14/2020   ANIONGAP 8 02/06/2017   Lab Results  Component Value Date   CHOL 136 10/06/2019   Lab Results  Component Value Date   HDL 24 (L) 10/06/2019   Lab Results  Component Value Date   LDLCALC 21 10/06/2019   Lab Results  Component Value Date   TRIG 706 ([place]) 10/06/2019   Lab Results  Component Value Date   CHOLHDL 5.7 (H) 10/06/2019   Lab Results  Component Value Date   HGBA1C 7.9 (H) 06/14/2020      Assessment & Plan:   Problem List Items Addressed This Visit      Cardiovascular and Mediastinum   Essential hypertension, benign     Endocrine   Type 2 diabetes mellitus not at goal [facility]) - Primary   Hyperlipidemia associated with type 2 diabetes mellitus ([place])     1. Type 2 diabetes mellitus not at goal [facility]) Pt currently on Farxiga at 10mg  daily, does not recognize any new side effects. Labs show his a1c slight improved, down to 7.9, no longer taking [place]. Endocronolgy appt coming up in a few weeks for their assessment and recommendation.   2. Essential hypertension, benign BP appears to be at target, 132/80. No change in medications.   3. Hypothyroidism Surgically induced hypothyroid. Current Synthroid dose appears to be therapeutic based on laboratory assessment.   No orders of the defined types  were placed in this encounter.   Follow-up: No follow-ups on file.  FU in 6wks with labs.    Geena Weinhold Charyl Bigger

## 2020-07-06 ENCOUNTER — Other Ambulatory Visit: Payer: Self-pay | Admitting: Internal Medicine

## 2020-07-06 ENCOUNTER — Telehealth: Payer: Self-pay

## 2020-07-06 DIAGNOSIS — I10 Essential (primary) hypertension: Secondary | ICD-10-CM

## 2020-07-06 NOTE — Telephone Encounter (Signed)
Patient Richard Riley requesting refill on mirtazapine.   Heather, Education officer, museum, has left multiple messages for patient to reschedule missed appts.  I Richard Riley for patient that no refills on this medication will be given until appt with Nira Conn has been made and kept.

## 2020-07-11 ENCOUNTER — Other Ambulatory Visit: Payer: Self-pay | Admitting: Internal Medicine

## 2020-07-11 DIAGNOSIS — I10 Essential (primary) hypertension: Secondary | ICD-10-CM

## 2020-07-24 ENCOUNTER — Other Ambulatory Visit: Payer: Self-pay | Admitting: Nephrology

## 2020-07-24 ENCOUNTER — Other Ambulatory Visit: Payer: Self-pay | Admitting: Internal Medicine

## 2020-07-24 DIAGNOSIS — I10 Essential (primary) hypertension: Secondary | ICD-10-CM

## 2020-07-25 ENCOUNTER — Ambulatory Visit: Payer: Medicaid Other

## 2020-07-26 ENCOUNTER — Other Ambulatory Visit: Payer: Self-pay

## 2020-07-26 ENCOUNTER — Other Ambulatory Visit: Payer: Medicaid Other

## 2020-07-26 DIAGNOSIS — E119 Type 2 diabetes mellitus without complications: Secondary | ICD-10-CM

## 2020-07-27 ENCOUNTER — Telehealth: Payer: Self-pay | Admitting: Licensed Clinical Social Worker

## 2020-07-27 LAB — HEMOGLOBIN A1C
Est. average glucose Bld gHb Est-mCnc: 252 mg/dL
Hgb A1c MFr Bld: 10.4 % — ABNORMAL HIGH (ref 4.8–5.6)

## 2020-07-27 LAB — LIPID PANEL
Chol/HDL Ratio: 4.6 ratio (ref 0.0–5.0)
Cholesterol, Total: 124 mg/dL (ref 100–199)
HDL: 27 mg/dL — ABNORMAL LOW (ref >39)
LDL Chol Calc (NIH): 53 mg/dL (ref 0–99)
Triglycerides: 283 mg/dL — ABNORMAL HIGH (ref 0–149)
VLDL Cholesterol Cal: 44 mg/dL — ABNORMAL HIGH (ref 5–40)

## 2020-07-27 LAB — COMPREHENSIVE METABOLIC PANEL
ALT: 41 IU/L (ref 0–44)
AST: 21 IU/L (ref 0–40)
Albumin/Globulin Ratio: 1.7 (ref 1.2–2.2)
Albumin: 4.5 g/dL (ref 4.0–5.0)
Alkaline Phosphatase: 97 IU/L (ref 44–121)
BUN/Creatinine Ratio: 18 (ref 9–20)
BUN: 19 mg/dL (ref 6–24)
Bilirubin Total: 0.8 mg/dL (ref 0.0–1.2)
CO2: 26 mmol/L (ref 20–29)
Calcium: 9.5 mg/dL (ref 8.7–10.2)
Chloride: 97 mmol/L (ref 96–106)
Creatinine, Ser: 1.03 mg/dL (ref 0.76–1.27)
GFR calc Af Amer: 99 mL/min/{1.73_m2} (ref >59)
GFR calc non Af Amer: 85 mL/min/{1.73_m2} (ref >59)
Globulin, Total: 2.7 g/dL (ref 1.5–4.5)
Glucose: 324 mg/dL — ABNORMAL HIGH (ref 65–99)
Potassium: 4.3 mmol/L (ref 3.5–5.2)
Sodium: 137 mmol/L (ref 134–144)
Total Protein: 7.2 g/dL (ref 6.0–8.5)

## 2020-07-27 LAB — URINALYSIS
Bilirubin, UA: NEGATIVE
Ketones, UA: NEGATIVE
Leukocytes,UA: NEGATIVE
Nitrite, UA: NEGATIVE
Protein,UA: NEGATIVE
RBC, UA: NEGATIVE
Specific Gravity, UA: 1.019 (ref 1.005–1.030)
Urobilinogen, Ur: 0.2 mg/dL (ref 0.2–1.0)
pH, UA: 5 (ref 5.0–7.5)

## 2020-07-27 NOTE — Telephone Encounter (Signed)
Left a message with woman who answered patient's home phone and on patient's cell phone, asking him to call the [facility] so that I can reschedule his appointment with Jerrilyn Cairo.

## 2020-08-01 NOTE — Telephone Encounter (Signed)
Patient received a phone message from the clinic informing him that Dr. Mable Fill will not be here tomorrow and that he can be rescheduled for October 6th at 9 am.  Patient is fine with that.  Also made an appointment for him to see [facility].

## 2020-08-02 ENCOUNTER — Ambulatory Visit: Payer: Self-pay | Admitting: Internal Medicine

## 2020-08-09 ENCOUNTER — Encounter: Payer: Self-pay | Admitting: Internal Medicine

## 2020-08-09 ENCOUNTER — Ambulatory Visit: Payer: Medicaid Other | Admitting: Internal Medicine

## 2020-08-09 ENCOUNTER — Other Ambulatory Visit: Payer: Self-pay

## 2020-08-09 DIAGNOSIS — E119 Type 2 diabetes mellitus without complications: Secondary | ICD-10-CM

## 2020-08-09 DIAGNOSIS — I10 Essential (primary) hypertension: Secondary | ICD-10-CM

## 2020-08-09 DIAGNOSIS — E785 Hyperlipidemia, unspecified: Secondary | ICD-10-CM

## 2020-08-09 MED ORDER — LANTUS SOLOSTAR 100 UNIT/ML ~~LOC~~ SOPN: PEN_INJECTOR | SUBCUTANEOUS | 11 refills | Status: AC

## 2020-08-09 NOTE — Progress Notes (Signed)
Established Patient Office Visit  Subjective:  Patient ID: Richard Simonin., male    DOB: [birth year]  Age: 49 y.o. MRN: [id number]  CC:  Chief Complaint  Patient presents with  . Diabetes  . Pain    HPI [employer]. presents for fu appt. He reports doing well, trying to remain active.   Past Medical History:  Diagnosis Date  . Allergy   . GERD (gastroesophageal reflux disease)   . Heart murmur   . Hypertension   . PONV (postoperative nausea and vomiting)    mild N/V after surgery 30 years ago  . Sleep apnea   . Thyroid disease   . Tobacco abuse 02/11/2017  . Type 2 diabetes mellitus not at goal [facility]) 10/22/2016    Past Surgical History:  Procedure Laterality Date  . FEMUR FRACTURE SURGERY  child  . THYROIDECTOMY N/A 01/23/2017   Procedure: THYROIDECTOMY;  Surgeon: Jules Husbands, MD;  Location: [facility] ORS;  Service: General;  Laterality: N/A;  . TOTAL THYROIDECTOMY  01/16/2017    Family History  Problem Relation Age of Onset  . Hypertension Mother   . Hypothyroidism Mother   . Diabetes Father     Social History   Socioeconomic History  . Marital status: Single    Spouse name: Not on file  . Number of children: 3  . Years of education: Not on file  . Highest education level: Not on file  Occupational History  . Not on file  Tobacco Use  . Smoking status: Former Smoker    Packs/day: 0.10    Types: Cigarettes    Quit date: 09/10/2015    Years since quitting: 4.9  . Smokeless tobacco: Current User    Types: Snuff  Vaping Use  . Vaping Use: Never used  Substance and Sexual Activity  . Alcohol use: Not Currently    Comment: last drink was a little less than a year ago per the pt  . Drug use: No  . Sexual activity: Yes    Birth control/protection: Condom  Other Topics Concern  . Not on file  Social History Narrative   Social determinant screening completed. Patient has applied for disability and is awaiting a response. He does not require a referral at  this point in time.   Social Determinants of Health   Financial Resource Strain: Medium Risk  . Difficulty of Paying Living Expenses: Somewhat hard  Food Insecurity: No Food Insecurity  . Worried About Charity fundraiser in the Last Year: Never true  . Ran Out of Food in the Last Year: Never true  Transportation Needs: No Transportation Needs  . Lack of Transportation (Medical): No  . Lack of Transportation (Non-Medical): No  Physical Activity: Sufficiently Active  . Days of Exercise per Week: 7 days  . Minutes of Exercise per Session: 140 min  Stress: Stress Concern Present  . Feeling of Stress : To some extent  Social Connections: Moderately Isolated  . Frequency of Communication with Friends and Family: Three times a week  . Frequency of Social Gatherings with Friends and Family: Three times a week  . Attends Religious Services: 1 to 4 times per year  . Active Member of Clubs or Organizations: No  . Attends Archivist Meetings: Never  . Marital Status: Divorced  Human resources officer Violence: Not At Risk  . Fear of Current or Ex-Partner: No  . Emotionally Abused: No  . Physically Abused: No  . Sexually Abused: No  Outpatient Medications Prior to Visit  Medication Sig Dispense Refill  . amLODipine (NORVASC) 5 MG tablet Take 5 mg by mouth daily.    Marland Kitchen aspirin EC 81 MG tablet Take 81 mg by mouth daily.    Marland Kitchen atorvastatin (LIPITOR) 20 MG tablet TAKE ONE TABLET BY MOUTH EVERY DAY 90 tablet 0  . cetirizine (ZYRTEC) 10 MG tablet TAKE ONE TABLET BY MOUTH EVERY DAY 90 tablet 0  . fluticasone (FLONASE) 50 MCG/ACT nasal spray 2 puffs each nostril dailyl 16 g 12  . gabapentin (NEURONTIN) 300 MG capsule Take one pill twice during the day and take two pills at bedtime    . hydrochlorothiazide (HYDRODIURIL) 25 MG tablet TAKE ONE TABLET BY MOUTH EVERY DAY 90 tablet 0  . Insulin Glargine (LANTUS SOLOSTAR) 100 UNIT/ML Solostar Pen Start with 22 units each evening. Increase dose by 2  units every three days if morning blood sugar over 130, as directed. (Patient taking differently: 40 Units. Start with 22 units each evening. Increase dose by 2 units every three days if morning blood sugar over 130, as directed.) 15 mL 11  . Insulin Pen Needle (PEN NEEDLES) 32G X 5 MM MISC 1 each by Does not apply route daily. 50 each 5  . levothyroxine (SYNTHROID) 200 MCG tablet TAKE ONE TABLET BY MOUTH EVERY DAY BEFORE BREAKFAST 90 tablet 3  . levothyroxine (SYNTHROID) 75 MCG tablet Take 1 tablet (75 mcg total) by mouth daily before breakfast. 90 tablet 3  . lisinopril (ZESTRIL) 40 MG tablet TAKE ONE TABLET (40 MG) BY MOUTH EVERY DAY 90 tablet 0  . metFORMIN (GLUCOPHAGE) 1000 MG tablet TAKE ONE TABLET BY MOUTH 2 TIMES A DAY WITH A MEAL. 180 tablet 0  . metoprolol tartrate (LOPRESSOR) 50 MG tablet TAKE ONE TABLET BY MOUTH 2 TIMES A DAY 120 tablet 0  . mirtazapine (REMERON) 15 MG tablet Take 22.5 mg by mouth at bedtime. 30 tablet 0  . mirtazapine (REMERON) 7.5 MG tablet Take 22.5 mg by mouth at bedtime. 30 tablet 0  . omeprazole (PRILOSEC) 20 MG capsule Take 1 capsule (20 mg total) by mouth at bedtime. 90 capsule 3  . sulfamethoxazole-trimethoprim (BACTRIM DS) 800-160 MG tablet Take 1 tablet by mouth 2 (two) times daily. 14 tablet 0   No facility-administered medications prior to visit.    No Known Allergies  ROS Review of Systems    Objective:    Physical Exam  There were no vitals taken for this visit. Wt Readings from Last 3 Encounters:  06/14/20 259 lb 4.8 oz (117.6 kg)  05/04/20 263 lb (119.3 kg)  03/29/20 269 lb 3.2 oz (122.1 kg)     Health Maintenance Due  Topic Date Due  . Hepatitis C Screening  Never done  . PNEUMOCOCCAL POLYSACCHARIDE VACCINE AGE 38-64 HIGH RISK  Never done  . FOOT EXAM  Never done  . OPHTHALMOLOGY EXAM  Never done  . TETANUS/TDAP  Never done    There are no preventive care reminders to display for this patient.  Lab Results  Component Value  Date   TSH 0.582 06/14/2020   Lab Results  Component Value Date   WBC 10.2 06/14/2020   HGB 16.1 06/14/2020   HCT 46.6 06/14/2020   MCV 88 06/14/2020   PLT 169 06/14/2020   Lab Results  Component Value Date   NA 137 07/26/2020   K 4.3 07/26/2020   CO2 26 07/26/2020   GLUCOSE 324 (H) 07/26/2020   BUN 19 07/26/2020  CREATININE 1.03 07/26/2020   BILITOT 0.8 07/26/2020   ALKPHOS 97 07/26/2020   AST 21 07/26/2020   ALT 41 07/26/2020   PROT 7.2 07/26/2020   ALBUMIN 4.5 07/26/2020   CALCIUM 9.5 07/26/2020   ANIONGAP 8 02/06/2017   Lab Results  Component Value Date   CHOL 124 07/26/2020   Lab Results  Component Value Date   HDL 27 (L) 07/26/2020   Lab Results  Component Value Date   LDLCALC 53 07/26/2020   Lab Results  Component Value Date   TRIG 283 (H) 07/26/2020   Lab Results  Component Value Date   CHOLHDL 4.6 07/26/2020   Lab Results  Component Value Date   HGBA1C 10.4 (H) 07/26/2020      Assessment & Plan:   Problem List Items Addressed This Visit    None    1. Type 2 diabetes mellitus not at goal [facility]) Richard Riley was rep;laced with Farxigas. Record shows a 10lb weight reduction. Lantus reduce from 40 to 30 units but a1c has risen to 10.4. Self monitoring sugars reported around 200, ho hypo or hyperglycemic reading reported.   Plan to increase Lantus from 30 to 34 units nightly.    2. Hyperlipidemia associated with type 2 diabetes mellitus ([place]) At target on current Lipitor dose.   3. Essential hypertension, benign In house bp 132/80, appears to be at target. No change in medication.   4. Thyroidectomy  Previous thyroidectomy. T4 and TSH appear to be balanced with current Synthroid dose, 235 mg a day.   FU and labs in 90 days.   No orders of the defined types were placed in this encounter.   Follow-up: No follow-ups on file.    Richard Riley Charyl Bigger

## 2020-08-14 ENCOUNTER — Telehealth: Payer: Self-pay | Admitting: Pharmacist

## 2020-08-14 NOTE — Telephone Encounter (Signed)
08/14/2020 10:06:50 AM - Lantus Solostar refill to [facility] - Monday, August 14, 2020 10:06 AM --Virgel Gess [employer] refill for Lantus Solostar Max 30 units daily #3 boxes.

## 2020-08-17 ENCOUNTER — Other Ambulatory Visit: Payer: Self-pay | Admitting: Gerontology

## 2020-08-17 ENCOUNTER — Other Ambulatory Visit: Payer: Self-pay | Admitting: Internal Medicine

## 2020-08-17 DIAGNOSIS — I10 Essential (primary) hypertension: Secondary | ICD-10-CM

## 2020-08-17 DIAGNOSIS — E0590 Thyrotoxicosis, unspecified without thyrotoxic crisis or storm: Secondary | ICD-10-CM

## 2020-08-17 DIAGNOSIS — E785 Hyperlipidemia, unspecified: Secondary | ICD-10-CM

## 2020-08-17 DIAGNOSIS — E1169 Type 2 diabetes mellitus with other specified complication: Secondary | ICD-10-CM

## 2020-08-17 MED ORDER — METOPROLOL TARTRATE 50 MG PO TABS: ORAL_TABLET | ORAL | 2 refills | Status: DC

## 2020-08-17 MED ORDER — ATORVASTATIN CALCIUM 20 MG PO TABS: 20.0000 mg | ORAL_TABLET | Freq: Every day | ORAL | 0 refills | Status: DC

## 2020-08-22 ENCOUNTER — Other Ambulatory Visit: Payer: Self-pay

## 2020-08-22 ENCOUNTER — Ambulatory Visit: Payer: Medicaid Other

## 2020-08-22 ENCOUNTER — Ambulatory Visit: Payer: Medicaid Other | Admitting: Licensed Clinical Social Worker

## 2020-08-22 ENCOUNTER — Telehealth: Payer: Self-pay

## 2020-08-22 ENCOUNTER — Ambulatory Visit: Payer: Self-pay | Admitting: Licensed Clinical Social Worker

## 2020-08-22 VITALS — BP 106/70 | HR 84 | Temp 97.1°F | Ht 72.0 in | Wt 265.0 lb

## 2020-08-22 DIAGNOSIS — F411 Generalized anxiety disorder: Secondary | ICD-10-CM

## 2020-08-22 DIAGNOSIS — F332 Major depressive disorder, recurrent severe without psychotic features: Secondary | ICD-10-CM

## 2020-08-22 DIAGNOSIS — E119 Type 2 diabetes mellitus without complications: Secondary | ICD-10-CM

## 2020-08-22 LAB — GLUCOSE, POCT (MANUAL RESULT ENTRY): POC Glucose: 289 mg/dl — AB (ref 70–99)

## 2020-08-22 NOTE — Telephone Encounter (Signed)
Called patient to let him know that per his desire to get through clinic in a time effective manner it is better for him to come close to his appointment time and not extra early, per clinic staff.

## 2020-08-22 NOTE — Progress Notes (Unsigned)
PO

## 2020-08-22 NOTE — BH Specialist Note (Signed)
Integrated Behavioral Health Follow Up Visit  MRN: [id number] Name: Richard Riley.   Type of Service: [place] Interpretor:No. Interpretor Name and Language: none  SUBJECTIVE: Richard Riley. is a 49 y.o. male accompanied by himself Patient was referred by Carlyon Shadow, NP for mental health  Patient reports the following symptoms/concerns: The patient reports that he has experienced some improvement in his physical pain since he has increased the dosage and frequency of gabapentin to 300 MG three times daily.  He notes he is concerned about coming to his endocrine appointment tonight because he is unable to sit longer than ten minutes or walk or stand longer than 15 minutes due to neuropathy pain associated with his diabetes. He reports that he spends 22 hours per day in bed due to severe pain. The patient discussed wanting to cancel his endocrine appointment. He states that his depression has worsened as a result of his pain however he finds strength from his faith which helps him to carry on. The patient discussed familial and finical stressors. He denied any suicidal or homicidal thoughts.  Duration of problem: ; Severity of problem: moderate  OBJECTIVE: Mood: Euthymic and Affect: Appropriate Risk of harm to self or others: No plan to harm self or others  LIFE CONTEXT: Family and Social: see above School/Work: see above  Self-Care: see above  Life Changes: see above  GOALS ADDRESSED: Patient will: 1.  Reduce symptoms of: anxiety and depression  2.  Increase knowledge and/or ability of: coping skills, healthy habits and stress reduction  3.  Demonstrate ability to: Increase healthy adjustment to current life circumstances  INTERVENTIONS: Interventions utilized:  Supportive Counseling was utilized by the clinician during today's session. The clinician processed with the patient how he has been doing since his last follow up session with  Julian Hy, LCSW. The clinician utilized reflective listening to provide a space for the patient to ventilate his frustrations and concerns regarding his current life circumstances.The clinician encouraged the patient to go to his endocrine night appointment and discussed how that could benefit him. The clinician further encouraged the patient to advocate for his need to reduce his wait time for his appointment.Clinician discussed with the patient regarding how he is managing his symptoms of anxiety and depression and encourage him to utilize his coping skills to address his current life circumstances. The clinician measured the patients anxiety and depression on a numerical scale.  Standardized Assessments completed: GAD-7 and PHQ 9  GAD-7 10 PHQ-9  18  ASSESSMENT: Patient currently experiencing see above.   Patient may benefit from see above   PLAN: 1. Follow up with behavioral health clinician on : 09/12/2020 2. Behavioral recommendations:  3. Referral(s): [place] (In Clinic) 4. "From scale of 1-10, how likely are you to follow plan?":   Lesli Albee, Student-Social Work

## 2020-08-31 ENCOUNTER — Other Ambulatory Visit: Payer: Self-pay | Admitting: Gerontology

## 2020-08-31 DIAGNOSIS — F332 Major depressive disorder, recurrent severe without psychotic features: Secondary | ICD-10-CM

## 2020-08-31 MED ORDER — MIRTAZAPINE 7.5 MG PO TABS: ORAL_TABLET | ORAL | 2 refills | Status: AC

## 2020-09-12 ENCOUNTER — Ambulatory Visit: Payer: Medicaid Other | Admitting: Licensed Clinical Social Worker

## 2020-09-14 ENCOUNTER — Telehealth: Payer: Self-pay

## 2020-09-14 NOTE — Telephone Encounter (Signed)
Left a message with patient's mother for him to call the [facility] so that his mental health appointment can be rescheduled.

## 2020-09-19 NOTE — Telephone Encounter (Signed)
Social work Theatre manager called patient and rescheduled his appointment to see the mental health clinician.

## 2020-10-10 ENCOUNTER — Ambulatory Visit: Payer: Self-pay | Admitting: Licensed Clinical Social Worker

## 2020-10-10 ENCOUNTER — Other Ambulatory Visit: Payer: Self-pay

## 2020-10-10 ENCOUNTER — Ambulatory Visit: Payer: Medicaid Other | Admitting: Licensed Clinical Social Worker

## 2020-10-10 DIAGNOSIS — F332 Major depressive disorder, recurrent severe without psychotic features: Secondary | ICD-10-CM

## 2020-10-10 DIAGNOSIS — F411 Generalized anxiety disorder: Secondary | ICD-10-CM

## 2020-10-10 NOTE — BH Specialist Note (Signed)
Integrated Behavioral Health Follow Up In-Person Visit  MRN: [id number] Name: Richard Riley.  Total time: 60 minutes  Types of Service: [place] ([facility])  Interpretor:No. Interpretor Name and Language:   Subjective: Richard Riley. is a 49 y.o. male accompanied by himself Patient was referred by Dr Mable Fill, MD.  for mental health Patient reports the following symptoms/concerns: The patient discussed that his Thanksgiving was not like it use to be since his Grandmother passed away a few years ago. He noted that he stayed home. He states he hopes Christmas is going to be better and his daughter's maybe coming to visit him. The patient discussed that he takes Mirtazapine 22.5 MG at 10:00 PM falls asleep by 11:00 PM and sleeps all night. The patient discussed financial and family stressors. He noted that he received a letter from disability stating that he was fully approved, but was unsure why it also stated he had 60 days to appeal.  He stated that he is also unsure how he will be paid or the type of medicaid he will receive and if that will be enough to cover the cost of all of his medications, therapies, and appointments. The patient denied any suicidal or homicidal thoughts.  Duration of problem: ; Severity of problem: moderate  Objective: Mood: Euthymic and Affect: Appropriate Risk of harm to self or others: No plan to harm self or others  Life Context: Family and Social: see above School/Work: see above Self-Care: see above Life Changes: see above  Patient and/or Family's Strengths/Protective Factors: Concrete supports in place (healthy food, safe environments, etc.) and Sense of purpose  Goals Addressed: Patient will: 1.  Reduce symptoms of: anxiety, depression and stress  2.  Increase knowledge and/or ability of: coping skills, healthy habits and self-management skills  3.  Demonstrate ability to: Increase healthy adjustment to current life  circumstances  Progress towards Goals: Ongoing  Interventions: Interventions utilized:  Supportive Counseling was utilized by the clinician during today's follow up session. Clinician processed with the patient regarding how she has been doing the last follow up session. Clinician measured the patient's anxiety and depression on a numerical scale. Clinician discussed with the patient regarding how things have been going with his family. Clinician offered the patient praise for taking his psychotropic medication as prescribed and making progress regarding his sleep hygiene. The clinician explained her holiday schedule to the patient and gave him information for [facility] walk in clinic and provided [facility]'s criss line in case the patient needed immediate assistance. The clinician also encouraged the patient to call the numbers listed on his disability letter to find answers to some of his questions. Further, the clinician provided the patient with  information regarding medicaid.   Standardized Assessments completed: GAD-7 and PHQ 9  GAD-7   17 PHQ-9     9  Assessment: Patient currently experiencing see above.   Patient may benefit from see above  Plan: 1. Follow up with behavioral health clinician on : 11/28/2020 at 4:00 PM  2. Behavioral recommendations:  3. Referral(s): [facility] (In Clinic) 4. "From scale of 1-10, how likely are you to follow plan?":  Richard Riley, Student-Social Work

## 2020-10-17 ENCOUNTER — Other Ambulatory Visit: Payer: Self-pay | Admitting: Internal Medicine

## 2020-10-17 DIAGNOSIS — E119 Type 2 diabetes mellitus without complications: Secondary | ICD-10-CM

## 2020-10-20 ENCOUNTER — Telehealth: Payer: Self-pay | Admitting: Pharmacist

## 2020-10-20 NOTE — Telephone Encounter (Signed)
10/20/2020 1:40:20 PM - Lantus Solostar refill to [facility]  -- Richard Riley - Friday, October 20, 2020 1:39 PM --Sending [employer] refill request to [facility] for Lantus Solostar Inject 30 units daily # 3 for provider to sign & return.

## 2020-10-23 ENCOUNTER — Telehealth: Payer: Self-pay | Admitting: Pharmacist

## 2020-10-23 NOTE — Telephone Encounter (Signed)
10/23/2020 11:41:44 AM - Temp. provider due to [place] - Monday, October 23, 2020 11:39 AM --I have received an email from [place] to temporary change provider from [place] (vacation till 11/14/2020) to Desta Abate,NP to sign in Elizabeth's absence.  [employer] refill request for Lantus Solostar Inject 30 units once daily # 3 boxes and also page 4 of application due to a change in provider.

## 2020-11-08 ENCOUNTER — Other Ambulatory Visit: Payer: Medicaid Other

## 2020-11-08 ENCOUNTER — Other Ambulatory Visit: Payer: Self-pay

## 2020-11-08 VITALS — BP 116/73 | HR 96 | Temp 97.6°F | Ht 72.0 in | Wt 254.0 lb

## 2020-11-08 DIAGNOSIS — E119 Type 2 diabetes mellitus without complications: Secondary | ICD-10-CM

## 2020-11-08 NOTE — Progress Notes (Unsigned)
cm

## 2020-11-09 LAB — MICROALBUMIN / CREATININE URINE RATIO
Creatinine, Urine: 102.6 mg/dL
Microalb/Creat Ratio: 220 mg/g creat — ABNORMAL HIGH (ref 0–29)
Microalbumin, Urine: 225.9 ug/mL

## 2020-11-09 LAB — LIPID PANEL
Chol/HDL Ratio: 5.3 ratio — ABNORMAL HIGH (ref 0.0–5.0)
Cholesterol, Total: 160 mg/dL (ref 100–199)
HDL: 30 mg/dL — ABNORMAL LOW
LDL Chol Calc (NIH): 68 mg/dL (ref 0–99)
Triglycerides: 395 mg/dL — ABNORMAL HIGH (ref 0–149)
VLDL Cholesterol Cal: 62 mg/dL — ABNORMAL HIGH (ref 5–40)

## 2020-11-09 LAB — COMPREHENSIVE METABOLIC PANEL WITH GFR
ALT: 63 IU/L — ABNORMAL HIGH (ref 0–44)
AST: 36 IU/L (ref 0–40)
Albumin/Globulin Ratio: 1.7 (ref 1.2–2.2)
Albumin: 4.6 g/dL (ref 4.0–5.0)
Alkaline Phosphatase: 74 IU/L (ref 44–121)
BUN/Creatinine Ratio: 25 — ABNORMAL HIGH (ref 9–20)
BUN: 25 mg/dL — ABNORMAL HIGH (ref 6–24)
Bilirubin Total: 1.9 mg/dL — ABNORMAL HIGH (ref 0.0–1.2)
CO2: 26 mmol/L (ref 20–29)
Calcium: 9.4 mg/dL (ref 8.7–10.2)
Chloride: 96 mmol/L (ref 96–106)
Creatinine, Ser: 1.02 mg/dL (ref 0.76–1.27)
GFR calc Af Amer: 99 mL/min/1.73
GFR calc non Af Amer: 86 mL/min/1.73
Globulin, Total: 2.7 g/dL (ref 1.5–4.5)
Glucose: 214 mg/dL — ABNORMAL HIGH (ref 65–99)
Potassium: 4.1 mmol/L (ref 3.5–5.2)
Sodium: 137 mmol/L (ref 134–144)
Total Protein: 7.3 g/dL (ref 6.0–8.5)

## 2020-11-09 LAB — URINALYSIS
Bilirubin, UA: NEGATIVE
Ketones, UA: NEGATIVE
Leukocytes,UA: NEGATIVE
Nitrite, UA: NEGATIVE
RBC, UA: NEGATIVE
Specific Gravity, UA: >=1.03 — AB (ref 1.005–1.030)
Urobilinogen, Ur: 0.2 mg/dL (ref 0.2–1.0)
pH, UA: 5 (ref 5.0–7.5)

## 2020-11-09 LAB — HEMOGLOBIN A1C
Est. average glucose Bld gHb Est-mCnc: 226 mg/dL
Hgb A1c MFr Bld: 9.5 % — ABNORMAL HIGH (ref 4.8–5.6)

## 2020-11-09 LAB — CBC WITH DIFFERENTIAL/PLATELET
Basophils Absolute: 0.1 10*3/uL (ref 0.0–0.2)
Basos: 1 %
EOS (ABSOLUTE): 0.4 10*3/uL (ref 0.0–0.4)
Eos: 4 %
Hematocrit: 53.1 % — ABNORMAL HIGH (ref 37.5–51.0)
Hemoglobin: 18.1 g/dL — ABNORMAL HIGH (ref 13.0–17.7)
Immature Grans (Abs): 0 10*3/uL (ref 0.0–0.1)
Immature Granulocytes: 0 %
Lymphocytes Absolute: 3.8 10*3/uL — ABNORMAL HIGH (ref 0.7–3.1)
Lymphs: 35 %
MCH: 29.4 pg (ref 26.6–33.0)
MCHC: 34.1 g/dL (ref 31.5–35.7)
MCV: 86 fL (ref 79–97)
Monocytes Absolute: 0.8 10*3/uL (ref 0.1–0.9)
Monocytes: 7 %
Neutrophils Absolute: 5.6 10*3/uL (ref 1.4–7.0)
Neutrophils: 53 %
Platelets: 191 10*3/uL (ref 150–450)
RBC: 6.15 x10E6/uL — ABNORMAL HIGH (ref 4.14–5.80)
RDW: 13.1 % (ref 11.6–15.4)
WBC: 10.8 10*3/uL (ref 3.4–10.8)

## 2020-11-09 LAB — TSH: TSH: 1.12 u[IU]/mL (ref 0.450–4.500)

## 2020-11-09 LAB — T4: T4, Total: 10 ug/dL (ref 4.5–12.0)

## 2020-11-15 ENCOUNTER — Other Ambulatory Visit: Payer: Self-pay

## 2020-11-15 ENCOUNTER — Telehealth: Payer: Self-pay | Admitting: Pharmacist

## 2020-11-15 ENCOUNTER — Encounter: Payer: Self-pay | Admitting: Internal Medicine

## 2020-11-15 ENCOUNTER — Telehealth: Payer: Self-pay | Admitting: Internal Medicine

## 2020-11-15 DIAGNOSIS — E1169 Type 2 diabetes mellitus with other specified complication: Secondary | ICD-10-CM

## 2020-11-15 DIAGNOSIS — E119 Type 2 diabetes mellitus without complications: Secondary | ICD-10-CM

## 2020-11-15 DIAGNOSIS — I10 Essential (primary) hypertension: Secondary | ICD-10-CM

## 2020-11-15 MED ORDER — HYDROCHLOROTHIAZIDE 25 MG PO TABS: 25.0000 mg | ORAL_TABLET | Freq: Every day | ORAL | 0 refills | Status: DC

## 2020-11-15 NOTE — Progress Notes (Signed)
Established Patient Office Visit  Subjective:  Patient ID: Richard Riley., male    DOB: [birth year]  Age: 50 y.o. MRN: [id number]  CC:  Chief Complaint  Patient presents with  . Follow-up  . Diabetes    HPI [employer]. presents for follow up. Pt reports doing well.  Past Medical History:  Diagnosis Date  . Allergy   . GERD (gastroesophageal reflux disease)   . Heart murmur   . Hypertension   . PONV (postoperative nausea and vomiting)    mild N/V after surgery 30 years ago  . Sleep apnea   . Thyroid disease   . Tobacco abuse 02/11/2017  . Type 2 diabetes mellitus not at goal [facility]) 10/22/2016    Past Surgical History:  Procedure Laterality Date  . FEMUR FRACTURE SURGERY  child  . THYROIDECTOMY N/A 01/23/2017   Procedure: THYROIDECTOMY;  Surgeon: Jules Husbands, MD;  Location: [facility] ORS;  Service: General;  Laterality: N/A;  . TOTAL THYROIDECTOMY  01/16/2017    Family History  Problem Relation Age of Onset  . Hypertension Mother   . Hypothyroidism Mother   . Diabetes Father     Social History   Socioeconomic History  . Marital status: Single    Spouse name: Not on file  . Number of children: 3  . Years of education: Not on file  . Highest education level: Not on file  Occupational History  . Not on file  Tobacco Use  . Smoking status: Former Smoker    Packs/day: 0.10    Types: Cigarettes    Quit date: 09/10/2015    Years since quitting: 5.1  . Smokeless tobacco: Current User    Types: Snuff  Vaping Use  . Vaping Use: Never used  Substance and Sexual Activity  . Alcohol use: Not Currently    Comment: last drink was a little less than a year ago per the pt  . Drug use: No  . Sexual activity: Yes    Birth control/protection: Condom  Other Topics Concern  . Not on file  Social History Narrative   Social determinant screening completed. Patient has applied for disability and is awaiting a response. He does not require a referral at this point in time.    Social Determinants of Health   Financial Resource Strain: Medium Risk  . Difficulty of Paying Living Expenses: Somewhat hard  Food Insecurity: No Food Insecurity  . Worried About Charity fundraiser in the Last Year: Never true  . Ran Out of Food in the Last Year: Never true  Transportation Needs: No Transportation Needs  . Lack of Transportation (Medical): No  . Lack of Transportation (Non-Medical): No  Physical Activity: Sufficiently Active  . Days of Exercise per Week: 7 days  . Minutes of Exercise per Session: 140 min  Stress: Stress Concern Present  . Feeling of Stress : To some extent  Social Connections: Moderately Isolated  . Frequency of Communication with Friends and Family: Three times a week  . Frequency of Social Gatherings with Friends and Family: Three times a week  . Attends Religious Services: 1 to 4 times per year  . Active Member of Clubs or Organizations: No  . Attends Archivist Meetings: Never  . Marital Status: Divorced  Human resources officer Violence: Not At Risk  . Fear of Current or Ex-Partner: No  . Emotionally Abused: No  . Physically Abused: No  . Sexually Abused: No    Outpatient Medications Prior  to Visit  Medication Sig Dispense Refill  . amLODipine (NORVASC) 5 MG tablet Take 5 mg by mouth daily.    Marland Kitchen aspirin EC 81 MG tablet Take 81 mg by mouth daily.    Marland Kitchen atorvastatin (LIPITOR) 20 MG tablet TAKE ONE TABLET BY MOUTH EVERY DAY 60 tablet 0  . cetirizine (ZYRTEC) 10 MG tablet TAKE ONE TABLET BY MOUTH EVERY DAY 90 tablet 0  . dapagliflozin propanediol (FARXIGA) 10 MG TABS tablet Take 10 mg by mouth daily.    . fluticasone (FLONASE) 50 MCG/ACT nasal spray 2 puffs each nostril dailyl (Patient taking differently: Place 1 spray into both nostrils in the morning and at bedtime. ) 16 g 12  . gabapentin (NEURONTIN) 300 MG capsule 1 capsule QAM, 1 capsule @1PM , 2 capsules QPM    . hydrochlorothiazide (HYDRODIURIL) 25 MG tablet TAKE ONE TABLET BY  MOUTH EVERY DAY 90 tablet 0  . insulin glargine (LANTUS SOLOSTAR) 100 UNIT/ML Solostar Pen Change to 34 units each evening. 15 mL 11  . Insulin Pen Needle (PEN NEEDLES) 32G X 5 MM MISC 1 each by Does not apply route daily. 50 each 5  . levothyroxine (SYNTHROID) 200 MCG tablet TAKE ONE TABLET BY MOUTH EVERY DAY BEFORE BREAKFAST 90 tablet 3  . levothyroxine (SYNTHROID) 75 MCG tablet Take 1 tablet (75 mcg total) by mouth daily before breakfast. 90 tablet 3  . lisinopril (ZESTRIL) 40 MG tablet TAKE ONE TABLET (40 MG) BY MOUTH EVERY DAY 90 tablet 0  . metFORMIN (GLUCOPHAGE) 1000 MG tablet TAKE ONE TABLET BY MOUTH 2 TIMES A DAY WITH A MEAL. 180 tablet 0  . metoprolol tartrate (LOPRESSOR) 50 MG tablet TAKE ONE TABLET BY MOUTH 2 TIMES A DAY 180 tablet 0  . mirtazapine (REMERON) 7.5 MG tablet Take 22.5 mg by mouth at bedtime. 90 tablet 2  . omeprazole (PRILOSEC) 20 MG capsule Take 1 capsule (20 mg total) by mouth at bedtime. 90 capsule 3  . sulfamethoxazole-trimethoprim (BACTRIM DS) 800-160 MG tablet Take 1 tablet by mouth 2 (two) times daily. (Patient not taking: Reported on 08/22/2020) 14 tablet 0   No facility-administered medications prior to visit.    No Known Allergies  ROS Review of Systems    Objective:    Physical Exam  There were no vitals taken for this visit. Wt Readings from Last 3 Encounters:  11/08/20 254 lb (115.2 kg)  08/22/20 265 lb (120.2 kg)  06/14/20 259 lb 4.8 oz (117.6 kg)     Health Maintenance Due  Topic Date Due  . Hepatitis C Screening  Never done  . PNEUMOCOCCAL POLYSACCHARIDE VACCINE AGE 88-64 HIGH RISK  Never done  . FOOT EXAM  Never done  . OPHTHALMOLOGY EXAM  Never done  . TETANUS/TDAP  Never done  . COLONOSCOPY (Pts 45-60yrs Insurance coverage will need to be confirmed)  Never done    There are no preventive care reminders to display for this patient.  Lab Results  Component Value Date   TSH 1.120 11/08/2020   Lab Results  Component Value Date    WBC 10.8 11/08/2020   HGB 18.1 (H) 11/08/2020   HCT 53.1 (H) 11/08/2020   MCV 86 11/08/2020   PLT 191 11/08/2020   Lab Results  Component Value Date   NA 137 11/08/2020   K 4.1 11/08/2020   CO2 26 11/08/2020   GLUCOSE 214 (H) 11/08/2020   BUN 25 (H) 11/08/2020   CREATININE 1.02 11/08/2020   BILITOT 1.9 (H) 11/08/2020  ALKPHOS 74 11/08/2020   AST 36 11/08/2020   ALT 63 (H) 11/08/2020   PROT 7.3 11/08/2020   ALBUMIN 4.6 11/08/2020   CALCIUM 9.4 11/08/2020   ANIONGAP 8 02/06/2017   Lab Results  Component Value Date   CHOL 160 11/08/2020   Lab Results  Component Value Date   HDL 30 (L) 11/08/2020   Lab Results  Component Value Date   LDLCALC 68 11/08/2020   Lab Results  Component Value Date   TRIG 395 (H) 11/08/2020   Lab Results  Component Value Date   CHOLHDL 5.3 (H) 11/08/2020   Lab Results  Component Value Date   HGBA1C 9.5 (H) 11/08/2020      Assessment & Plan:   Problem List Items Addressed This Visit      Cardiovascular and Mediastinum   Essential hypertension, benign     Endocrine   Type 2 diabetes mellitus not at goal [facility]) - Primary   Hyperlipidemia associated with type 2 diabetes mellitus ([place])     1. Type 2 diabetes mellitus not at goal ([place]) A1C has improved from 10.4 to 9.5. On Farxiga over the last 6 months, there has been an approximately 20lb weight reduction. Periodically checks his own sugars, morning readings run from 120-180 without hypoglycemic symptoms. No modifications to his medications are recommended at this time.   2. Essential hypertension, benign Blood pressure appears to be at target on current medications.   3. Hyothyroid Hypothyroid surgically educed to to pre cancerous nodule, current medication for replacement  appear to be theraputic.   4.Mental Health Mental health issues appear to be stable, followed by the clinic social worker.   Pt reports he has been approved for disability, waiting for transition into  private care.  No orders of the defined types were placed in this encounter.   Follow-up: No follow-ups on file.    Lennon Boutwell Charyl Bigger

## 2020-11-15 NOTE — Telephone Encounter (Signed)
11/15/2020 11:15:59 AM - Lantus Solostar refill to [place] - Wednesday, November 15, 2020 11:15 AM --Virgel Gess [employer] refill for Lantus Solostar Inject 30 units once daily # 3 boxes.

## 2020-11-28 ENCOUNTER — Telehealth: Payer: Self-pay | Admitting: Licensed Clinical Social Worker

## 2020-11-28 ENCOUNTER — Ambulatory Visit: Payer: Medicaid Other | Admitting: Licensed Clinical Social Worker

## 2020-11-28 NOTE — Telephone Encounter (Signed)
Called patient at his home and mobile numbers for today's appointment. I left a voicemail on the mobile number with the contact information and instructions on how to reschedule his missed appointment.

## 2020-12-07 ENCOUNTER — Other Ambulatory Visit: Payer: Self-pay | Admitting: Internal Medicine

## 2020-12-07 ENCOUNTER — Other Ambulatory Visit: Payer: Self-pay | Admitting: Gerontology

## 2020-12-07 DIAGNOSIS — E1169 Type 2 diabetes mellitus with other specified complication: Secondary | ICD-10-CM

## 2020-12-07 DIAGNOSIS — I10 Essential (primary) hypertension: Secondary | ICD-10-CM

## 2020-12-07 DIAGNOSIS — E785 Hyperlipidemia, unspecified: Secondary | ICD-10-CM

## 2020-12-12 ENCOUNTER — Other Ambulatory Visit: Payer: Self-pay | Admitting: Gerontology

## 2020-12-19 ENCOUNTER — Other Ambulatory Visit: Payer: Self-pay | Admitting: Gerontology

## 2020-12-19 DIAGNOSIS — E1169 Type 2 diabetes mellitus with other specified complication: Secondary | ICD-10-CM

## 2020-12-19 DIAGNOSIS — E785 Hyperlipidemia, unspecified: Secondary | ICD-10-CM

## 2020-12-19 MED ORDER — ATORVASTATIN CALCIUM 20 MG PO TABS: 20.0000 mg | ORAL_TABLET | Freq: Every day | ORAL | 0 refills | Status: AC

## 2020-12-22 ENCOUNTER — Telehealth: Payer: Self-pay | Admitting: Pharmacy Technician

## 2020-12-22 NOTE — Telephone Encounter (Signed)
Pt has Medicaid with prescription drug coverage.  No longer meets [facility]'s eligibility criteria.  Pt notified by letter.  [place], [place]  [address]  December 22, 2020    [address], [address], [place]  [id number]  Dear Layla Barter:  This is to inform you that you are no longer eligible to receive medication assistance at [facility].  The reason(s) are:    _____Your total gross monthly household income exceeds 250% of the Federal Poverty Level.   _____Tangible assets (savings, checking, stocks/bonds, pension, retirement, etc.) exceeds our limit  __X__You are eligible to receive benefits from [facility], [facility] or HIV Medication            Assistance program _____You are eligible to receive benefits from a Medicare Part "D" plan __X__You have prescription insurance  _____You are not an [facility] resident _____Failure to provide all requested proof of income information for 2022.    We regret that we are unable to help you at this time.  If your prescription coverage is terminated, please contact [facility], so that we may reassess your eligibility for our program.  If you have questions, we may be contacted at [phone].  Thank you,  [facility]

## 2021-01-09 ENCOUNTER — Other Ambulatory Visit: Payer: Self-pay | Admitting: Gerontology

## 2021-02-07 ENCOUNTER — Other Ambulatory Visit: Payer: Self-pay

## 2021-02-14 ENCOUNTER — Ambulatory Visit: Payer: Self-pay | Admitting: Internal Medicine

## 2021-05-30 ENCOUNTER — Telehealth: Payer: Self-pay

## 2021-05-30 NOTE — Telephone Encounter (Signed)
Social worker called home number and left message with patient's mother to call clinic and provided clinic phone number.   Social worker then called patient's mobile number and left a message for him to give her a call so that she could either reschedule his appointment with [facility] or receive a referral to another mental health care provider and provided clinic phone number.

## 2021-06-06 NOTE — Telephone Encounter (Signed)
Social worker called [facility] to offer him the options of making him an appointment with the [facility] or being referred out to another mental health provider and patient stated that he would call me back, without choosing either option.

## 2021-06-12 NOTE — Telephone Encounter (Signed)
Social worker called patient to reschedule his appointment with the [facility] and left a voice message asking him to call back to reschedule and informing patient that we can refer him out to another mental health care provider based on his preference. Provided clinic phone number.
